# Patient Record
Sex: Male | Born: 1970
Health system: Southern US, Community
[De-identification: ages and names within clinical notes are randomized; demographics above are authoritative.]

## PROBLEM LIST (undated history)

## (undated) DIAGNOSIS — F411 Generalized anxiety disorder: Secondary | ICD-10-CM

## (undated) DIAGNOSIS — E7800 Pure hypercholesterolemia, unspecified: Secondary | ICD-10-CM

## (undated) DIAGNOSIS — M25559 Pain in unspecified hip: Secondary | ICD-10-CM

## (undated) DIAGNOSIS — E079 Disorder of thyroid, unspecified: Secondary | ICD-10-CM

## (undated) DIAGNOSIS — M1990 Unspecified osteoarthritis, unspecified site: Secondary | ICD-10-CM

## (undated) DIAGNOSIS — K529 Noninfective gastroenteritis and colitis, unspecified: Secondary | ICD-10-CM

## (undated) DIAGNOSIS — F17200 Nicotine dependence, unspecified, uncomplicated: Secondary | ICD-10-CM

## (undated) DIAGNOSIS — F1920 Other psychoactive substance dependence, uncomplicated: Secondary | ICD-10-CM

## (undated) DIAGNOSIS — Z8739 Personal history of other diseases of the musculoskeletal system and connective tissue: Secondary | ICD-10-CM

## (undated) DIAGNOSIS — F329 Major depressive disorder, single episode, unspecified: Secondary | ICD-10-CM

## (undated) DIAGNOSIS — M5430 Sciatica, unspecified side: Secondary | ICD-10-CM

## (undated) DIAGNOSIS — Z8639 Personal history of other endocrine, nutritional and metabolic disease: Secondary | ICD-10-CM

## (undated) DIAGNOSIS — M549 Dorsalgia, unspecified: Secondary | ICD-10-CM

## (undated) DIAGNOSIS — I10 Essential (primary) hypertension: Secondary | ICD-10-CM

## (undated) DIAGNOSIS — K219 Gastro-esophageal reflux disease without esophagitis: Secondary | ICD-10-CM

## (undated) DIAGNOSIS — G8929 Other chronic pain: Secondary | ICD-10-CM

## (undated) DIAGNOSIS — N289 Disorder of kidney and ureter, unspecified: Secondary | ICD-10-CM

## (undated) HISTORY — DX: Disorder of thyroid, unspecified: E07.9

## (undated) HISTORY — DX: Pain in unspecified hip: M25.559

## (undated) HISTORY — DX: Noninfective gastroenteritis and colitis, unspecified: K52.9

## (undated) HISTORY — PX: HEMORRHOID SURGERY: SHX153

## (undated) HISTORY — DX: Gastro-esophageal reflux disease without esophagitis: K21.9

## (undated) HISTORY — DX: Personal history of other diseases of the musculoskeletal system and connective tissue: Z87.39

## (undated) HISTORY — DX: Personal history of other endocrine, nutritional and metabolic disease: Z86.39

## (undated) HISTORY — DX: Generalized anxiety disorder: F41.1

## (undated) HISTORY — DX: Nicotine dependence, unspecified, uncomplicated: F17.200

## (undated) HISTORY — DX: Dorsalgia, unspecified: M54.9

## (undated) HISTORY — DX: Other psychoactive substance dependence, uncomplicated: F19.20

## (undated) HISTORY — DX: Major depressive disorder, single episode, unspecified: F32.9

## (undated) HISTORY — DX: Pure hypercholesterolemia, unspecified: E78.00

---

## 1998-06-21 ENCOUNTER — Ambulatory Visit (HOSPITAL_BASED_OUTPATIENT_CLINIC_OR_DEPARTMENT_OTHER): Admission: RE | Admit: 1998-06-21 | Discharge: 1998-06-21 | Payer: Self-pay | Admitting: Surgery

## 2003-05-19 ENCOUNTER — Ambulatory Visit (HOSPITAL_COMMUNITY): Admission: RE | Admit: 2003-05-19 | Discharge: 2003-05-19 | Payer: Self-pay | Admitting: Surgery

## 2003-05-19 ENCOUNTER — Encounter (INDEPENDENT_AMBULATORY_CARE_PROVIDER_SITE_OTHER): Payer: Self-pay | Admitting: *Deleted

## 2003-11-05 DIAGNOSIS — F3289 Other specified depressive episodes: Secondary | ICD-10-CM

## 2003-11-05 DIAGNOSIS — F329 Major depressive disorder, single episode, unspecified: Secondary | ICD-10-CM

## 2003-11-05 HISTORY — DX: Major depressive disorder, single episode, unspecified: F32.9

## 2003-11-05 HISTORY — DX: Other specified depressive episodes: F32.89

## 2004-12-24 ENCOUNTER — Ambulatory Visit: Payer: Self-pay | Admitting: Internal Medicine

## 2005-01-02 ENCOUNTER — Ambulatory Visit: Payer: Self-pay | Admitting: Internal Medicine

## 2005-01-19 ENCOUNTER — Emergency Department (HOSPITAL_COMMUNITY): Admission: EM | Admit: 2005-01-19 | Discharge: 2005-01-19 | Payer: Self-pay | Admitting: Emergency Medicine

## 2005-01-23 ENCOUNTER — Ambulatory Visit: Payer: Self-pay | Admitting: Internal Medicine

## 2005-01-30 ENCOUNTER — Ambulatory Visit: Payer: Self-pay | Admitting: Internal Medicine

## 2005-02-14 ENCOUNTER — Emergency Department (HOSPITAL_COMMUNITY): Admission: EM | Admit: 2005-02-14 | Discharge: 2005-02-14 | Payer: Self-pay | Admitting: Emergency Medicine

## 2005-02-18 ENCOUNTER — Ambulatory Visit: Payer: Self-pay | Admitting: Internal Medicine

## 2005-03-04 ENCOUNTER — Ambulatory Visit: Payer: Self-pay | Admitting: Internal Medicine

## 2005-03-04 ENCOUNTER — Emergency Department: Payer: Self-pay | Admitting: Emergency Medicine

## 2005-03-07 ENCOUNTER — Ambulatory Visit: Payer: Self-pay | Admitting: Internal Medicine

## 2005-03-19 ENCOUNTER — Ambulatory Visit: Payer: Self-pay | Admitting: Internal Medicine

## 2005-04-04 ENCOUNTER — Ambulatory Visit: Payer: Self-pay | Admitting: Pain Medicine

## 2005-04-05 ENCOUNTER — Ambulatory Visit: Payer: Self-pay | Admitting: Pain Medicine

## 2005-04-17 ENCOUNTER — Ambulatory Visit: Payer: Self-pay | Admitting: Pain Medicine

## 2005-04-18 ENCOUNTER — Ambulatory Visit: Payer: Self-pay | Admitting: Pain Medicine

## 2005-04-23 ENCOUNTER — Ambulatory Visit: Payer: Self-pay | Admitting: Pain Medicine

## 2005-05-01 ENCOUNTER — Ambulatory Visit: Payer: Self-pay | Admitting: Pain Medicine

## 2005-05-09 ENCOUNTER — Ambulatory Visit: Payer: Self-pay | Admitting: Pain Medicine

## 2005-05-21 ENCOUNTER — Ambulatory Visit: Payer: Self-pay | Admitting: Physician Assistant

## 2005-05-22 ENCOUNTER — Ambulatory Visit: Payer: Self-pay | Admitting: Internal Medicine

## 2005-06-07 ENCOUNTER — Ambulatory Visit: Payer: Self-pay | Admitting: Physician Assistant

## 2005-07-15 ENCOUNTER — Ambulatory Visit: Payer: Self-pay | Admitting: Internal Medicine

## 2005-07-30 ENCOUNTER — Ambulatory Visit: Payer: Self-pay | Admitting: Physician Assistant

## 2005-08-23 ENCOUNTER — Emergency Department: Payer: Self-pay | Admitting: Emergency Medicine

## 2005-08-27 ENCOUNTER — Ambulatory Visit: Payer: Self-pay | Admitting: Physician Assistant

## 2005-09-17 ENCOUNTER — Ambulatory Visit: Payer: Self-pay | Admitting: Internal Medicine

## 2005-09-23 ENCOUNTER — Ambulatory Visit: Payer: Self-pay | Admitting: Physician Assistant

## 2005-10-23 ENCOUNTER — Ambulatory Visit: Payer: Self-pay | Admitting: Physician Assistant

## 2005-11-13 ENCOUNTER — Ambulatory Visit: Payer: Self-pay | Admitting: Internal Medicine

## 2006-01-16 ENCOUNTER — Ambulatory Visit: Payer: Self-pay | Admitting: Internal Medicine

## 2006-01-23 ENCOUNTER — Ambulatory Visit: Payer: Self-pay | Admitting: Physician Assistant

## 2006-02-18 ENCOUNTER — Emergency Department: Payer: Self-pay | Admitting: Emergency Medicine

## 2006-02-19 ENCOUNTER — Ambulatory Visit: Payer: Self-pay | Admitting: Physician Assistant

## 2006-02-24 ENCOUNTER — Ambulatory Visit: Payer: Self-pay | Admitting: Pain Medicine

## 2006-03-05 ENCOUNTER — Ambulatory Visit: Payer: Self-pay | Admitting: Physician Assistant

## 2006-03-13 ENCOUNTER — Ambulatory Visit: Payer: Self-pay | Admitting: Pain Medicine

## 2006-03-17 ENCOUNTER — Ambulatory Visit: Payer: Self-pay | Admitting: Internal Medicine

## 2006-03-19 ENCOUNTER — Ambulatory Visit: Payer: Self-pay | Admitting: Pain Medicine

## 2006-03-26 ENCOUNTER — Ambulatory Visit: Payer: Self-pay | Admitting: Pain Medicine

## 2006-03-28 ENCOUNTER — Emergency Department (HOSPITAL_COMMUNITY): Admission: EM | Admit: 2006-03-28 | Discharge: 2006-03-29 | Payer: Self-pay | Admitting: Emergency Medicine

## 2006-04-01 ENCOUNTER — Ambulatory Visit: Payer: Self-pay | Admitting: Pain Medicine

## 2006-04-16 ENCOUNTER — Ambulatory Visit: Payer: Self-pay | Admitting: Pain Medicine

## 2006-04-30 ENCOUNTER — Ambulatory Visit: Payer: Self-pay | Admitting: Pain Medicine

## 2006-05-12 ENCOUNTER — Ambulatory Visit: Payer: Self-pay | Admitting: Pain Medicine

## 2006-05-16 ENCOUNTER — Ambulatory Visit: Payer: Self-pay | Admitting: Internal Medicine

## 2006-05-23 ENCOUNTER — Ambulatory Visit: Payer: Self-pay | Admitting: Neurology

## 2006-06-03 ENCOUNTER — Ambulatory Visit: Payer: Self-pay | Admitting: Pain Medicine

## 2006-06-11 ENCOUNTER — Ambulatory Visit: Payer: Self-pay | Admitting: Pain Medicine

## 2006-07-14 ENCOUNTER — Ambulatory Visit: Payer: Self-pay | Admitting: Pain Medicine

## 2006-07-14 ENCOUNTER — Ambulatory Visit: Payer: Self-pay | Admitting: Internal Medicine

## 2006-08-04 ENCOUNTER — Ambulatory Visit: Payer: Self-pay | Admitting: Internal Medicine

## 2006-08-18 ENCOUNTER — Ambulatory Visit: Payer: Self-pay | Admitting: Pain Medicine

## 2006-08-22 ENCOUNTER — Emergency Department: Payer: Self-pay | Admitting: Emergency Medicine

## 2006-08-25 ENCOUNTER — Ambulatory Visit: Payer: Self-pay | Admitting: Internal Medicine

## 2006-08-26 ENCOUNTER — Encounter: Payer: Self-pay | Admitting: Pain Medicine

## 2006-09-01 ENCOUNTER — Ambulatory Visit: Payer: Self-pay | Admitting: Pain Medicine

## 2006-09-04 ENCOUNTER — Ambulatory Visit: Payer: Self-pay | Admitting: Pain Medicine

## 2006-09-05 ENCOUNTER — Encounter: Payer: Self-pay | Admitting: Pain Medicine

## 2006-09-15 ENCOUNTER — Ambulatory Visit: Payer: Self-pay | Admitting: Physician Assistant

## 2006-09-18 ENCOUNTER — Ambulatory Visit: Payer: Self-pay | Admitting: Pain Medicine

## 2006-09-29 ENCOUNTER — Ambulatory Visit: Payer: Self-pay | Admitting: Internal Medicine

## 2006-10-01 ENCOUNTER — Ambulatory Visit: Payer: Self-pay | Admitting: Physician Assistant

## 2006-10-21 ENCOUNTER — Ambulatory Visit: Payer: Self-pay | Admitting: Internal Medicine

## 2006-11-12 ENCOUNTER — Ambulatory Visit: Payer: Self-pay | Admitting: Physician Assistant

## 2007-02-17 DIAGNOSIS — M549 Dorsalgia, unspecified: Secondary | ICD-10-CM | POA: Insufficient documentation

## 2007-02-17 HISTORY — DX: Dorsalgia, unspecified: M54.9

## 2007-08-14 ENCOUNTER — Ambulatory Visit: Payer: Self-pay | Admitting: Chiropractor

## 2007-11-27 ENCOUNTER — Ambulatory Visit: Payer: Self-pay | Admitting: Internal Medicine

## 2007-12-17 ENCOUNTER — Encounter: Payer: Self-pay | Admitting: Pain Medicine

## 2008-01-03 ENCOUNTER — Encounter: Payer: Self-pay | Admitting: Pain Medicine

## 2008-01-25 ENCOUNTER — Emergency Department: Payer: Self-pay | Admitting: Emergency Medicine

## 2008-03-11 ENCOUNTER — Ambulatory Visit: Payer: Self-pay | Admitting: Nurse Practitioner

## 2008-03-11 DIAGNOSIS — R109 Unspecified abdominal pain: Secondary | ICD-10-CM

## 2008-03-11 DIAGNOSIS — F411 Generalized anxiety disorder: Secondary | ICD-10-CM

## 2008-03-11 DIAGNOSIS — M25461 Effusion, right knee: Secondary | ICD-10-CM | POA: Insufficient documentation

## 2008-03-11 DIAGNOSIS — M25561 Pain in right knee: Secondary | ICD-10-CM

## 2008-03-11 DIAGNOSIS — E7800 Pure hypercholesterolemia, unspecified: Secondary | ICD-10-CM

## 2008-03-11 DIAGNOSIS — M25559 Pain in unspecified hip: Secondary | ICD-10-CM

## 2008-03-11 HISTORY — DX: Generalized anxiety disorder: F41.1

## 2008-03-11 HISTORY — DX: Pure hypercholesterolemia, unspecified: E78.00

## 2008-03-11 HISTORY — DX: Pain in unspecified hip: M25.559

## 2008-03-11 LAB — CONVERTED CEMR LAB
Specific Gravity, Urine: 1.015
WBC Urine, dipstick: NEGATIVE

## 2008-04-15 ENCOUNTER — Ambulatory Visit: Payer: Self-pay | Admitting: Nurse Practitioner

## 2008-04-15 LAB — CONVERTED CEMR LAB
AST: 18 units/L (ref 0–37)
Alkaline Phosphatase: 110 units/L (ref 39–117)
BUN: 18 mg/dL (ref 6–23)
Basophils Absolute: 0 10*3/uL (ref 0.0–0.1)
CO2: 26 meq/L (ref 19–32)
Eosinophils Relative: 1 % (ref 0–5)
Glucose, Bld: 87 mg/dL (ref 70–99)
Hemoglobin: 16.7 g/dL (ref 13.0–17.0)
Lymphocytes Relative: 18 % (ref 12–46)
Lymphs Abs: 2.2 10*3/uL (ref 0.7–4.0)
Monocytes Absolute: 0.8 10*3/uL (ref 0.1–1.0)
Neutro Abs: 9.3 10*3/uL — ABNORMAL HIGH (ref 1.7–7.7)
Neutrophils Relative %: 74 % (ref 43–77)
Potassium: 5.3 meq/L (ref 3.5–5.3)
RBC: 5.49 M/uL (ref 4.22–5.81)
TSH: 2.727 microintl units/mL (ref 0.350–5.50)
Total Bilirubin: 0.3 mg/dL (ref 0.3–1.2)
WBC: 12.4 10*3/uL — ABNORMAL HIGH (ref 4.0–10.5)

## 2008-04-25 ENCOUNTER — Telehealth (INDEPENDENT_AMBULATORY_CARE_PROVIDER_SITE_OTHER): Payer: Self-pay | Admitting: Nurse Practitioner

## 2008-05-09 ENCOUNTER — Encounter (INDEPENDENT_AMBULATORY_CARE_PROVIDER_SITE_OTHER): Payer: Self-pay | Admitting: Nurse Practitioner

## 2008-05-12 ENCOUNTER — Telehealth (INDEPENDENT_AMBULATORY_CARE_PROVIDER_SITE_OTHER): Payer: Self-pay | Admitting: Nurse Practitioner

## 2008-06-05 ENCOUNTER — Emergency Department (HOSPITAL_COMMUNITY): Admission: EM | Admit: 2008-06-05 | Discharge: 2008-06-06 | Payer: Self-pay | Admitting: Emergency Medicine

## 2008-06-13 ENCOUNTER — Ambulatory Visit: Payer: Self-pay | Admitting: Nurse Practitioner

## 2008-06-17 ENCOUNTER — Encounter (INDEPENDENT_AMBULATORY_CARE_PROVIDER_SITE_OTHER): Payer: Self-pay | Admitting: Nurse Practitioner

## 2008-06-20 ENCOUNTER — Ambulatory Visit: Payer: Self-pay | Admitting: Nurse Practitioner

## 2008-06-21 LAB — CONVERTED CEMR LAB
Albumin: 4.6 g/dL (ref 3.5–5.2)
HDL: 42 mg/dL (ref >39)
Total Bilirubin: 0.5 mg/dL (ref 0.3–1.2)
Total CHOL/HDL Ratio: 3.6
Total Protein: 7.5 g/dL (ref 6.0–8.3)
Triglycerides: 257 mg/dL — ABNORMAL HIGH (ref <150)

## 2008-06-30 ENCOUNTER — Encounter (INDEPENDENT_AMBULATORY_CARE_PROVIDER_SITE_OTHER): Payer: Self-pay | Admitting: Nurse Practitioner

## 2008-07-01 ENCOUNTER — Encounter (INDEPENDENT_AMBULATORY_CARE_PROVIDER_SITE_OTHER): Payer: Self-pay | Admitting: Nurse Practitioner

## 2008-07-15 ENCOUNTER — Ambulatory Visit: Payer: Self-pay | Admitting: Nurse Practitioner

## 2008-07-15 DIAGNOSIS — F17200 Nicotine dependence, unspecified, uncomplicated: Secondary | ICD-10-CM

## 2008-07-15 HISTORY — DX: Nicotine dependence, unspecified, uncomplicated: F17.200

## 2008-07-25 ENCOUNTER — Telehealth (INDEPENDENT_AMBULATORY_CARE_PROVIDER_SITE_OTHER): Payer: Self-pay | Admitting: Nurse Practitioner

## 2008-07-27 ENCOUNTER — Encounter (INDEPENDENT_AMBULATORY_CARE_PROVIDER_SITE_OTHER): Payer: Self-pay | Admitting: Nurse Practitioner

## 2008-08-08 ENCOUNTER — Telehealth (INDEPENDENT_AMBULATORY_CARE_PROVIDER_SITE_OTHER): Payer: Self-pay | Admitting: Nurse Practitioner

## 2008-09-09 ENCOUNTER — Telehealth (INDEPENDENT_AMBULATORY_CARE_PROVIDER_SITE_OTHER): Payer: Self-pay | Admitting: Nurse Practitioner

## 2008-10-13 ENCOUNTER — Telehealth (INDEPENDENT_AMBULATORY_CARE_PROVIDER_SITE_OTHER): Payer: Self-pay | Admitting: Nurse Practitioner

## 2008-10-14 ENCOUNTER — Ambulatory Visit: Payer: Self-pay | Admitting: Nurse Practitioner

## 2008-10-14 LAB — CONVERTED CEMR LAB: HDL goal, serum: 40 mg/dL

## 2008-10-17 LAB — CONVERTED CEMR LAB
ALT: 18 units/L (ref 0–53)
AST: 14 units/L (ref 0–37)
Albumin: 4.6 g/dL (ref 3.5–5.2)
Alkaline Phosphatase: 84 units/L (ref 39–117)
Cholesterol: 196 mg/dL (ref 0–200)
HDL: 35 mg/dL — ABNORMAL LOW (ref >39)
Total Bilirubin: 0.5 mg/dL (ref 0.3–1.2)
Total CHOL/HDL Ratio: 5.6
Total Protein: 7.2 g/dL (ref 6.0–8.3)

## 2008-12-29 ENCOUNTER — Ambulatory Visit: Payer: Self-pay | Admitting: Nurse Practitioner

## 2008-12-29 DIAGNOSIS — K219 Gastro-esophageal reflux disease without esophagitis: Secondary | ICD-10-CM

## 2008-12-29 HISTORY — DX: Gastro-esophageal reflux disease without esophagitis: K21.9

## 2009-01-31 ENCOUNTER — Ambulatory Visit: Payer: Self-pay | Admitting: Nurse Practitioner

## 2009-01-31 DIAGNOSIS — R059 Cough, unspecified: Secondary | ICD-10-CM | POA: Insufficient documentation

## 2009-01-31 DIAGNOSIS — R05 Cough: Secondary | ICD-10-CM | POA: Insufficient documentation

## 2009-01-31 DIAGNOSIS — J029 Acute pharyngitis, unspecified: Secondary | ICD-10-CM | POA: Insufficient documentation

## 2009-01-31 LAB — CONVERTED CEMR LAB: Rapid Strep: NEGATIVE

## 2009-03-08 ENCOUNTER — Emergency Department (HOSPITAL_COMMUNITY): Admission: EM | Admit: 2009-03-08 | Discharge: 2009-03-08 | Payer: Self-pay | Admitting: Emergency Medicine

## 2009-03-20 ENCOUNTER — Telehealth (INDEPENDENT_AMBULATORY_CARE_PROVIDER_SITE_OTHER): Payer: Self-pay | Admitting: Nurse Practitioner

## 2009-04-07 ENCOUNTER — Encounter (INDEPENDENT_AMBULATORY_CARE_PROVIDER_SITE_OTHER): Payer: Self-pay | Admitting: Nurse Practitioner
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
## 2009-08-23 ENCOUNTER — Encounter (INDEPENDENT_AMBULATORY_CARE_PROVIDER_SITE_OTHER): Payer: Self-pay | Admitting: Nurse Practitioner

## 2009-09-20 ENCOUNTER — Telehealth: Payer: Self-pay | Admitting: Physician Assistant

## 2009-11-21 ENCOUNTER — Telehealth (INDEPENDENT_AMBULATORY_CARE_PROVIDER_SITE_OTHER): Payer: Self-pay | Admitting: Nurse Practitioner

## 2009-12-20 ENCOUNTER — Telehealth (INDEPENDENT_AMBULATORY_CARE_PROVIDER_SITE_OTHER): Payer: Self-pay | Admitting: Nurse Practitioner

## 2010-01-09 ENCOUNTER — Encounter (INDEPENDENT_AMBULATORY_CARE_PROVIDER_SITE_OTHER): Payer: Self-pay | Admitting: Nurse Practitioner

## 2010-01-16 ENCOUNTER — Encounter (INDEPENDENT_AMBULATORY_CARE_PROVIDER_SITE_OTHER): Payer: Self-pay | Admitting: Nurse Practitioner

## 2010-03-06 ENCOUNTER — Ambulatory Visit: Payer: Self-pay | Admitting: Nurse Practitioner

## 2010-03-06 LAB — CONVERTED CEMR LAB
Glucose, Urine, Semiquant: NEGATIVE
Ketones, urine, test strip: NEGATIVE
Rapid HIV Screen: NEGATIVE
Specific Gravity, Urine: >=1.03
Urobilinogen, UA: 0.2

## 2010-03-09 ENCOUNTER — Encounter (INDEPENDENT_AMBULATORY_CARE_PROVIDER_SITE_OTHER): Payer: Self-pay | Admitting: Nurse Practitioner

## 2010-03-12 ENCOUNTER — Encounter (INDEPENDENT_AMBULATORY_CARE_PROVIDER_SITE_OTHER): Payer: Self-pay | Admitting: Nurse Practitioner

## 2010-03-12 LAB — CONVERTED CEMR LAB
ALT: 20 units/L (ref 0–53)
AST: 13 units/L (ref 0–37)
Alkaline Phosphatase: 137 units/L — ABNORMAL HIGH (ref 39–117)
Basophils Relative: 0 % (ref 0–1)
Calcium: 9.7 mg/dL (ref 8.4–10.5)
Chlamydia, Swab/Urine, PCR: NEGATIVE
Chloride: 99 meq/L (ref 96–112)
Cholesterol: 200 mg/dL (ref 0–200)
Eosinophils Absolute: 0.1 10*3/uL (ref 0.0–0.7)
Eosinophils Relative: 1 % (ref 0–5)
Glucose, Bld: 99 mg/dL (ref 70–99)
HCT: 51 % (ref 39.0–52.0)
LDL Cholesterol: 119 mg/dL — ABNORMAL HIGH (ref 0–99)
Lymphocytes Relative: 19 % (ref 12–46)
MCV: 88.2 fL (ref 78.0–100.0)
Monocytes Absolute: 0.7 10*3/uL (ref 0.1–1.0)
Neutro Abs: 7.2 10*3/uL (ref 1.7–7.7)
Neutrophils Relative %: 74 % (ref 43–77)
RBC: 5.78 M/uL (ref 4.22–5.81)
Sodium: 139 meq/L (ref 135–145)
VLDL: 38 mg/dL (ref 0–40)
WBC: 9.8 10*3/uL (ref 4.0–10.5)

## 2010-04-06 ENCOUNTER — Telehealth (INDEPENDENT_AMBULATORY_CARE_PROVIDER_SITE_OTHER): Payer: Self-pay | Admitting: Nurse Practitioner

## 2010-04-06 LAB — CONVERTED CEMR LAB: GGT: 36 units/L (ref 7–51)

## 2010-06-15 ENCOUNTER — Encounter (INDEPENDENT_AMBULATORY_CARE_PROVIDER_SITE_OTHER): Payer: Self-pay | Admitting: Nurse Practitioner

## 2010-06-24 ENCOUNTER — Emergency Department (HOSPITAL_COMMUNITY): Admission: EM | Admit: 2010-06-24 | Discharge: 2010-06-25 | Payer: Self-pay | Admitting: Emergency Medicine

## 2010-07-23 ENCOUNTER — Telehealth (INDEPENDENT_AMBULATORY_CARE_PROVIDER_SITE_OTHER): Payer: Self-pay | Admitting: Nurse Practitioner

## 2010-10-05 ENCOUNTER — Encounter (INDEPENDENT_AMBULATORY_CARE_PROVIDER_SITE_OTHER): Payer: Self-pay | Admitting: Nurse Practitioner

## 2010-10-10 ENCOUNTER — Telehealth (INDEPENDENT_AMBULATORY_CARE_PROVIDER_SITE_OTHER): Payer: Self-pay | Admitting: Nurse Practitioner

## 2010-10-12 ENCOUNTER — Telehealth (INDEPENDENT_AMBULATORY_CARE_PROVIDER_SITE_OTHER): Payer: Self-pay | Admitting: Nurse Practitioner

## 2010-10-25 ENCOUNTER — Ambulatory Visit: Payer: Self-pay | Admitting: Nurse Practitioner

## 2010-11-04 ENCOUNTER — Emergency Department: Payer: Self-pay | Admitting: Internal Medicine

## 2010-11-06 ENCOUNTER — Telehealth (INDEPENDENT_AMBULATORY_CARE_PROVIDER_SITE_OTHER): Payer: Self-pay | Admitting: Internal Medicine

## 2010-12-04 NOTE — Letter (Signed)
Summary: MEDCO  MEDCO   Imported By: Arta Bruce 04/16/2010 15:19:20  _____________________________________________________________________  External Attachment:    Type:   Image     Comment:   External Document

## 2010-12-04 NOTE — Progress Notes (Signed)
Summary: TIME TO Jeremy Delgado RX  Phone Note Call from Patient Call back at [facility] Phone [phone]   Reason for Call: Refill Medication Summary of Call: MARTIN PT. MR Cazarez IS CALLING IN FOR HIS REFILL ON Jeremy Delgado RX. HE SAYS ITS DUE ON 02/17 Initial call taken by: Leodis Rains,  December 20, 2009 9:58 AM  Follow-up for Phone Call        forward to provider  last rx 1/17...... Follow-up by: [name] CMA,  December 20, 2009 10:08 AM  Additional Follow-up for Phone Call Additional follow up Details #1::        Rx printed and in basket Last visit 01/2009. Next month will be 1 year He will need to schedule an office visit in March 2011.  Advise pt to be fasting for routine labs Additional Follow-up by: Lehman Prom FNP,  December 21, 2009 8:11 AM    Additional Follow-up for Phone Call Additional follow up Details #2::    pt is scheuled to come in on 12/28/09 for office visit. Pt informed to be fasting for this appt. Follow-up by: [name],  December 22, 2009 12:28 PM  Prescriptions: ALPRAZOLAM 2 MG XR24H-TAB (ALPRAZOLAM) 1 tablet by mouth daily as needed for nerves  #30 x 0   Entered and Authorized by:   Lehman Prom FNP   Signed by:   Lehman Prom FNP on 12/21/2009   Method used:   Printed then faxed to ...       [facility]* (retail)       [address]       [place], [place]  [address]       Ph: [phone]       Fax: [phone]   RxID:   [id number]

## 2010-12-04 NOTE — Progress Notes (Signed)
Summary: Query:  Refill Omeprazole?  Phone Note Outgoing Call   Summary of Call: Do you want his omeprazole refilled? Last seen 03/06/10.   Initial call taken by: Dutch Quint RN,  July 23, 2010 11:35 AM  Follow-up for Phone Call        yes, ok to refill Follow-up by: Lehman Prom FNP,  July 23, 2010 3:21 PM  Additional Follow-up for Phone Call Additional follow up Details #1::        Refill completed.   Left message on answering machine for pt. to return call.  Dutch Quint RN  July 23, 2010 3:35 PM  Left message on answering machine for pt. to return call.  Dutch Quint RN  July 24, 2010 12:28 PM  Pt. notified of refill.  Dutch Quint RN  July 24, 2010 3:36 PM     Additional Follow-up for Phone Call Additional follow up Details #2::      Prescriptions: OMEPRAZOLE 20 MG TBEC (OMEPRAZOLE) One tablet by mouth two times a day 30 minutes before the meals for stomach  #60 x 4   Entered by:   Dutch Quint RN   Authorized by:   Lehman Prom FNP   Signed by:   Dutch Quint RN on 07/23/2010   Method used:   Electronically to        [facility]* (retail)       [address]       [place], [place]  [address]       Ph: [phone]       Fax: [phone]   RxID:   [id number]
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
## 2010-12-04 NOTE — Letter (Signed)
Summary: MEDCO  MEDCO   Imported By: Arta Bruce 06/28/2010 12:56:03  _____________________________________________________________________  External Attachment:    Type:   Image     Comment:   External Document

## 2010-12-04 NOTE — Assessment & Plan Note (Signed)
Summary: Depression/Hyperlipidemia   Vital Signs:  Patient profile:   40 year old male Weight:      185.4 pounds BMI:     25.95 BSA:     2.04 Temp:     98.5 degrees F oral Pulse rate:   120 / minute Pulse rhythm:   regular Resp:     20 per minute BP sitting:   132 / 96  (left arm) Cuff size:   regular  Vitals Entered By: [name] (Mar 06, 2010 11:38 AM) CC: renew medication...not sleeping , Lipid Management, Depression, Abdominal Pain Is Patient Diabetic? No Pain Assessment Patient in pain? yes     Location: leg, left side Intensity: 7 Onset of pain  Chronic  Does patient need assistance? Functional Status Self care Ambulation Normal   CC:  renew medication...not sleeping , Lipid Management, Depression, and Abdominal Pain.  History of Present Illness:  Pt into the office for follow-up - no visit for 1 year pt is fasting today for labs  Pain meds - still being managed by neurologist; pain meds ordered by that office   Anxiety - Alprazolam 2mg  XR daily until last month at which time he needed an appt and was not honered with refills.  He has not taken the medications in over 1 week. Following the cessation of the medication he started with numbess in right hand and numbness in his tongue.  Currently receiving disability and is unemployed.  Pt is divorced and is raising his Clune son.  Depression History:      The patient denies a depressed mood most of the day and a diminished interest in his usual daily activities.  The patient denies recurrent thoughts of death or suicide.        The patient denies that he feels like life is not worth living, denies that he wishes that he were dead, and denies that he has thought about ending his life.         Depression Treatment History:  Prior Medication Used:   Start Date: Assessment of Effect:   Comments:  Prozac (fluoxetine)     --     much improvement     --  Dyspepsia History:      He has no alarm features of  dyspepsia including no history of melena, hematochezia, dysphagia, persistent vomiting, or involuntary weight loss > 5%.  There is a prior history of GERD.  The patient does not have a prior history of documented ulcer disease.  The dominant symptom is heartburn or acid reflux.  An H-2 blocker medication is not currently being taken.  He has no history of a positive H. Pylori serology.  No previous upper endoscopy has been done.    Lipid Management History:      Positive NCEP/ATP III risk factors include HDL cholesterol less than 40 and current tobacco user.  Negative NCEP/ATP III risk factors include male age less than 40 years old, non-diabetic, no ASHD (atherosclerotic heart disease), no prior stroke/TIA, no peripheral vascular disease, and no history of aortic aneurysm.        The patient states that he does not know about the "Therapeutic Lifestyle Change" diet.  The patient does not know about adjunctive measures for cholesterol lowering.  Comments include: pt is taking meds as ordered.  The patient denies any symptoms to suggest myopathy or liver disease.      Habits & Providers  Alcohol-Tobacco-Diet     Alcohol drinks/day: 0  Tobacco Status: current     Tobacco Counseling: to quit use of tobacco products     Year Started: age 56  Exercise-Depression-Behavior     Drug Use: no     Seat Belt Use: 100     Sun Exposure: occasionally  Medications Prior to Update: 1)  Alprazolam 2 Mg Xr24h-Tab (Alprazolam) .Marland Kitchen.. 1 Tablet By Mouth Daily As Needed For Nerves 2)  Fluoxetine Hcl 40 Mg Caps (Fluoxetine Hcl) .Marland Kitchen.. 1 Tablet By Mouth Daily 3)  Lipitor 40 Mg Tabs (Atorvastatin Calcium) .Marland Kitchen.. 1 Tablet By Mouth Nightly For Cholesterol **pharmacy - Note Dose Change** 4)  Loratadine 10 Mg  Tabs (Loratadine) .Marland Kitchen.. 1 Tablet By Mouth Daily For Allergies 5)  Flexeril 10 Mg  Tabs (Cyclobenzaprine Hcl) .... One Tablet By Mouth Four Times Daily **rx By Neuro** 6)  Oxycodone Hcl 15 Mg Tabs (Oxycodone Hcl) ....  Take 4 Times Daily As Needed  **rx By Neuro** 7)  Meloxicam 7.5 Mg Tabs (Meloxicam) .Marland Kitchen.. 1 Tablet By Mouth Two Times A Day As Needed For Pain **rx By Neuro** 8)  Chantix Continuing Month Pak 1 Mg Tabs (Varenicline Tartrate) .Marland Kitchen.. 1 Tablet By Mouth Two Times A Day 9)  Kadian 30 Mg Xr24h-Cap (Morphine Sulfate) .Marland Kitchen.. 1 in The Morning and 2 in The Evening  **rx By Feraru** 10)  Omeprazole 20 Mg Tbec (Omeprazole) .... One Tablet By Mouth Two Times A Day 30 Minutes Before The Meals For Stomach 11)  Ceron-Dm 12.03-07-14 Mg/57ml Syrp (Phenylephrine-Chlorphen-Dm) .Marland Kitchen.. 1 Teaspoon Every 6 Hours As Needed For Cough 12)  Ventolin Hfa 108 (90 Base) Mcg/act Aers (Albuterol Sulfate) .... 2 Puffs Every 6 Hours As Needed For Shortness of Breath  Allergies (verified): 1)  ! * Codiene 2)  ! * Lyrica 3)  ! Prednisone 4)  ! * Bee Stings  Review of Systems CV:  Denies chest pain or discomfort. Resp:  Denies cough. GI:  Denies abdominal pain, nausea, and vomiting. MS:  Complains of joint pain and low back pain.  Physical Exam  General:  alert.   Head:  normocephalic.   Lungs:  normal breath sounds.   Heart:  normal rate and regular rhythm.   Msk:  up to the exam table - slow Neurologic:  alert & oriented X3.   Psych:  stoic   Impression & Recommendations:  Problem # 1:  DEPRESSION (ICD-311) advised pt to f/u if symptoms worsen - will continue fluoxentine for now will restart alprazolam - advised pt that side effects from tinging and numbness may be due to withdrawl from meds His updated medication list for this problem includes:    Alprazolam 2 Mg Xr24h-tab (Alprazolam) .Marland Kitchen... 1 tablet by mouth daily as needed for nerves    Fluoxetine Hcl 40 Mg Caps (Fluoxetine hcl) .Marland Kitchen... 1 tablet by mouth daily  Orders: Rapid HIV  ([address]) T-TSH ([address]454-[address]11)  Problem # 2:  HYPERCHOLESTEROLEMIA (ICD-272.0) will check labs today His updated medication list for this problem includes:    Lipitor 40 Mg Tabs  (Atorvastatin calcium) .Marland Kitchen... 1 tablet by mouth nightly for cholesterol **pharmacy - note dose change**  Orders: T-Lipid Profile (91478-29562) T-Comprehensive Metabolic Panel (13086-57846) T-CBC w/Diff (96295-28413)  Problem # 3:  BACK PAIN (ICD-724.5) pt to maintain f/u with neuro The following medications were removed from the medication list:    Meloxicam 7.5 Mg Tabs (Meloxicam) .Marland Kitchen... 1 tablet by mouth two times a day as needed for pain **rx by neuro** His updated medication list for this problem includes:  Flexeril 10 Mg Tabs (Cyclobenzaprine hcl) ..... One tablet by mouth four times daily **rx by neuro**    Oxycodone Hcl 15 Mg Tabs (Oxycodone hcl) .Marland Kitchen... Take 4 times daily as needed  **rx by neuro**    Kadian 30 Mg Xr24h-cap (Morphine sulfate) .Marland Kitchen... 1 in the morning and 2 in the evening  **rx by feraru**  Orders: UA Dipstick w/o Micro (manual) ([address])  Problem # 4:  ANXIETY (ICD-300.00)  His updated medication list for this problem includes:    Alprazolam 2 Mg Xr24h-tab (Alprazolam) .Marland Kitchen... 1 tablet by mouth daily as needed for nerves    Fluoxetine Hcl 40 Mg Caps (Fluoxetine hcl) .Marland Kitchen... 1 tablet by mouth daily  Problem # 5:  HEALTH MAINTENANCE EXAM (ICD-V70.0) tdap given today Orders: T- GC Chlamydia ([address])  Complete Medication List: 1)  Alprazolam 2 Mg Xr24h-tab (Alprazolam) .Marland Kitchen.. 1 tablet by mouth daily as needed for nerves 2)  Fluoxetine Hcl 40 Mg Caps (Fluoxetine hcl) .Marland Kitchen.. 1 tablet by mouth daily 3)  Lipitor 40 Mg Tabs (Atorvastatin calcium) .Marland Kitchen.. 1 tablet by mouth nightly for cholesterol **pharmacy - note dose change** 4)  Loratadine 10 Mg Tabs (Loratadine) .Marland Kitchen.. 1 tablet by mouth daily for allergies 5)  Flexeril 10 Mg Tabs (Cyclobenzaprine hcl) .... One tablet by mouth four times daily **rx by neuro** 6)  Oxycodone Hcl 15 Mg Tabs (Oxycodone hcl) .... Take 4 times daily as needed  **rx by neuro** 7)  Kadian 30 Mg Xr24h-cap (Morphine sulfate) .Marland Kitchen.. 1 in the morning and 2 in  the evening  **rx by feraru** 8)  Omeprazole 20 Mg Tbec (Omeprazole) .... One tablet by mouth two times a day 30 minutes before the meals for stomach 9)  Ventolin Hfa 108 (90 Base) Mcg/act Aers (Albuterol sulfate) .... 2 puffs every 6 hours as needed for shortness of breath  Other Orders: Tdap => 31yrs IM ([address]11) Admin 1st Vaccine (91478) Admin 1st Vaccine (State) [phone])  Lipid Assessment/Plan:      Based on NCEP/ATP III, the patient's risk factor category is "0-1 risk factors".  The patient's lipid goals are as follows: Total cholesterol goal is 200; LDL cholesterol goal is 160; HDL cholesterol goal is 40; Triglyceride goal is 150.     Patient Instructions: 1)  You will be notified of any abnormal labs 2)  Tingling, numbness and shakes most likely was from withdrawl of alprazolam 3)  Follow up at least yearly or as needed  Prescriptions: ALPRAZOLAM 2 MG XR24H-TAB (ALPRAZOLAM) 1 tablet by mouth daily as needed for nerves  #30 x 0   Entered and Authorized by:   Lehman Prom FNP   Signed by:   Lehman Prom FNP on 03/06/2010   Method used:   Print then Give to Patient   RxID:   [id number]    Tetanus/Td Vaccine    Vaccine Type: Tdap    Site: left deltoid    Mfr: Sanofi Pasteur    Dose: 0.5 ml    Route: IM    Given by: [name]    Exp. Date: 06/01/2012    Lot #: [id number]    VIS given: 09/22/07 version given Mar 06, 2010.   Last LDL:                                                 114 (10/14/2008 10:02:00 PM)  Laboratory Results   Urine Tests  Date/Time Received: Mar 06, 2010 1:45 PM   Routine Urinalysis   Color: Jeren Dufrane Appearance: Clear Glucose: negative   (Normal Range: Negative) Bilirubin: small   (Normal Range: Negative) Ketone: negative   (Normal Range: Negative) Spec. Gravity: >=1.030   (Normal Range: 1.003-1.035) Blood: negative   (Normal Range: Negative) pH: 5.0   (Normal Range: 5.0-8.0) Protein: 30   (Normal Range:  Negative) Urobilinogen: 0.2   (Normal Range: 0-1) Nitrite: negative   (Normal Range: Negative) Leukocyte Esterace: negative   (Normal Range: Negative)    Date/Time Received: Mar 06, 2010 1:46 PM   Other Tests  Rapid HIV: negative

## 2010-12-04 NOTE — Progress Notes (Signed)
Summary: Refills  Phone Note Call from Patient Call back at [facility] Phone [phone]   Summary of Call: The pt needs more refills from alprozalam and lipitor medications [facility]) . Please let him know when is ready. [facility] FNP Initial call taken by: Manon Hilding,  April 06, 2010 8:18 AM  Follow-up for Phone Call        Refills already printed and given to [facility] to fax back notify pt to check with pharmacy later today Follow-up by: Lehman Prom FNP,  April 06, 2010 8:22 AM  Additional Follow-up for Phone Call Additional follow up Details #1::        pt informed of above information Additional Follow-up by: [name],  April 06, 2010 10:07 AM

## 2010-12-04 NOTE — Letter (Signed)
Summary: [facility]  [facility]CO   Imported By: Arta Bruce 04/05/2010 14:34:27  _____________________________________________________________________  External Attachment:    Type:   Image     Comment:   External Document

## 2010-12-04 NOTE — Progress Notes (Signed)
Summary: Jeremy Delgado  Phone Note Call from Patient Call back at [facility] Phone [phone]   Summary of Call: aCCORDING TO THE PT [facility] SENT A REQUEST ABOUT THE XANAX MEDICATION AND THE PT IS WONDERING HOW SOON  HE CAN GET HIS REFILLS REQUEST BACK TO THE PHARMACY. MARTING FNP Initial call taken by: Manon Hilding,  November 21, 2009 2:15 PM  Follow-up for Phone Call        forward to N. Daphine Deutscher, FNP [name]  November 21, 2009 5:07 PM   Additional Follow-up for Phone Call Additional follow up Details #1::        See med list Rx done on 1/17/201, it should have been faxed to the pharmacy call pharmacy and if they did not recieve the fax (which apparently they didn't) call in med as per directions on list Additional Follow-up by: Lehman Prom FNP,  November 22, 2009 7:50 AM    Additional Follow-up for Phone Call Additional follow up Details #2::    left message to return call........Marland KitchenMikey [employer] [facility]  November 24, 2009 12:30 PM   [name]  November 27, 2009 2:33 PM Spoke with pharmacy they received Rx and pt has picked up Rx. Follow-up by: [name],  November 27, 2009 2:33 PM

## 2010-12-05 ENCOUNTER — Telehealth (INDEPENDENT_AMBULATORY_CARE_PROVIDER_SITE_OTHER): Payer: Self-pay | Admitting: Nurse Practitioner

## 2010-12-06 NOTE — Medication Information (Signed)
Summary: RX Folder//MEDCO  RX Folder//MEDCO   Imported By: Arta Bruce 10/15/2010 09:59:01  _____________________________________________________________________  External Attachment:    Type:   Image     Comment:   External Document

## 2010-12-06 NOTE — Progress Notes (Signed)
Summary: Alprazolam visit  Phone Note From Pharmacy   Summary of Call: Request for refill of Alprazolam--not due until the 6th Initial call taken by: Julieanne Manson MD,  November 06, 2010 6:47 PM  Follow-up for Phone Call        Rx in basket - shiela in basket notify pt to check with the pharmacy Follow-up by: Lehman Prom FNP,  November 08, 2010 2:22 PM  Additional Follow-up for Phone Call Additional follow up Details #1::        Pt. notified.  Dutch Quint RN  November 08, 2010 2:32 PM     Prescriptions: ALPRAZOLAM 2 MG XR24H-TAB (ALPRAZOLAM) 1 tablet by mouth daily as needed for nerves  #30 x 0   Entered and Authorized by:   Lehman Prom FNP   Signed by:   Lehman Prom FNP on 11/08/2010   Method used:   Printed then faxed to ...       [facility]* (retail)       [address]       [place], [place]  [address]       Ph: [phone]       Fax: [phone]   RxID:   [id number]

## 2010-12-06 NOTE — Progress Notes (Signed)
Summary: Referral to Jeremy Delgado  Phone Note Outgoing Call   Summary of Call: Refer to pt Jeremy Delgado - Depression  notify pt of time/date of appt  Initial call taken by: Lehman Prom FNP,  October 12, 2010 5:45 PM  Follow-up for Phone Call        I call the pt and he has an appt 10-25-10 @ 2pm pt aware of his appt Follow-up by: Cheryll Dessert,  October 19, 2010 9:28 AM

## 2010-12-12 NOTE — Progress Notes (Signed)
Summary: Med refill  Phone Note Outgoing Call   Summary of Call: pt was advised on last month that he needed an office visit for alprazolam.   Does he have an appt scheduled? If not, call pt and inform him that it has been 1 year since his last visit here.  Med will not be refilled until he comes for a visit Initial call taken by: Lehman Prom FNP,  December 05, 2010 6:10 PM  Follow-up for Phone Call        Left message on answering machine for pt to call back...Marland KitchenMarland KitchenArmenia Shannon  December 06, 2010 11:35 AM  pt says he is running low and he says he only has enough to last til sunday and he wants to know if he could have some til his appt date... pt has appt on feb 8 @ 11:45.... [facility] Follow-up by: China Shannon,  December 07, 2010 9:43 AM  Additional Follow-up for Phone Call Additional follow up Details #1::        OK. will refill since pt has an appt on next week. advise him to check with [facility] for the refill and to keep his appt Additional Follow-up by: Kally Cadden Martin FNP,  December 07, 2010 12:15 PM    Additional Follow-up for Phone Call Additional follow up Details #2::    pt is aware Follow-up by: China Shannon,  December 07, 2010 12:47 PM  Prescriptions: ALPRAZOLAM 2 MG XR24H-TAB (ALPRAZOLAM) 1 tablet by mouth daily as needed for nerves  #30 x 0   Entered and Authorized by:   Teaghan Melrose Martin FNP   Signed by:   Alajia Schmelzer Martin FNP on 12/07/2010   Method used:   Printed then faxed to ...       [facility]* (retail)       [address]       [place], [place]  [address]       Ph: [phone]       Fax: [phone]   RxID:   [id number]

## 2010-12-19 ENCOUNTER — Encounter (INDEPENDENT_AMBULATORY_CARE_PROVIDER_SITE_OTHER): Payer: Self-pay | Admitting: Nurse Practitioner

## 2010-12-19 ENCOUNTER — Encounter: Payer: Self-pay | Admitting: Nurse Practitioner

## 2010-12-19 LAB — CONVERTED CEMR LAB: Rapid HIV Screen: NEGATIVE

## 2010-12-20 LAB — CONVERTED CEMR LAB
ALT: 18 units/L (ref 0–53)
Albumin: 4.4 g/dL (ref 3.5–5.2)
Alkaline Phosphatase: 105 units/L (ref 39–117)
Basophils Absolute: 0 10*3/uL (ref 0.0–0.1)
Calcium: 9.6 mg/dL (ref 8.4–10.5)
Cholesterol: 204 mg/dL — ABNORMAL HIGH (ref 0–200)
Eosinophils Absolute: 0.1 10*3/uL (ref 0.0–0.7)
Eosinophils Relative: 2 % (ref 0–5)
Lymphs Abs: 1.6 10*3/uL (ref 0.7–4.0)
MCHC: 33.4 g/dL (ref 30.0–36.0)
Monocytes Absolute: 0.8 10*3/uL (ref 0.1–1.0)
Neutro Abs: 5.1 10*3/uL (ref 1.7–7.7)
Platelets: 344 10*3/uL (ref 150–400)
Potassium: 5.4 meq/L — ABNORMAL HIGH (ref 3.5–5.3)
RBC: 4.97 M/uL (ref 4.22–5.81)
RDW: 13.8 % (ref 11.5–15.5)
Sodium: 141 meq/L (ref 135–145)
WBC: 7.7 10*3/uL (ref 4.0–10.5)

## 2010-12-26 NOTE — Assessment & Plan Note (Signed)
Summary: Hypercholesterolemia   Vital Signs:  Patient profile:   40 year old male Weight:      201.0 pounds BMI:     28.14 Temp:     97.8 degrees F oral Pulse rate:   80 / minute Pulse rhythm:   regular Resp:     20 per minute BP sitting:   114 / 84  (left arm) Cuff size:   regular  Vitals Entered By: [name] (December 19, 2010 11:32 AM)  Nutrition Counseling: Patient's BMI is greater than 25 and therefore counseled on weight management options. CC: follow-up visit...renew medications, Lipid Management, Abdominal Pain, Depression Is Patient Diabetic? No Pain Assessment Patient in pain? yes     Location: leg, foot Intensity: 6 Onset of pain  Chronic  Does patient need assistance? Functional Status Self care Ambulation Normal   CC:  follow-up visit...renew medications, Lipid Management, Abdominal Pain, and Depression.  History of Present Illness:  Pt into the office for follow up Last visit 1 year ago  Pt presents today with all his medications  Chronic pain - ongoing f/u with pain management.  He presents today with medications from that office of which he maintains chronic f/u.    Depression History:      The patient denies recurrent thoughts of death or suicide.        Psychosocial stress factors include major life changes.  The patient denies that he feels like life is not worth living, denies that he wishes that he were dead, and denies that he has thought about ending his life.         Depression Treatment History:  Prior Medication Used:   Start Date: Assessment of Effect:   Comments:  Prozac (fluoxetine)     --       --       ongoing symptoms  Dyspepsia History:      He has no alarm features of dyspepsia including no history of melena, hematochezia, dysphagia, persistent vomiting, or involuntary weight loss > 5%.  There is a prior history of GERD.  The patient does not have a prior history of documented ulcer disease.  The dominant symptom is  heartburn or acid reflux.  An H-2 blocker medication is not currently being taken.    Lipid Management History:      Positive NCEP/ATP III risk factors include current tobacco user.  Negative NCEP/ATP III risk factors include male age less than 45 years old, non-diabetic, no ASHD (atherosclerotic heart disease), no prior stroke/TIA, no peripheral vascular disease, and no history of aortic aneurysm.        The patient states that he does not know about the "Therapeutic Lifestyle Change" diet.  He expresses no side effects from his lipid-lowering medication.  The patient denies any symptoms to suggest myopathy or liver disease.      Habits & Providers  Alcohol-Tobacco-Diet     Alcohol drinks/day: 0     Tobacco Status: current     Tobacco Counseling: to quit use of tobacco products     Year Started: age 66  Exercise-Depression-Behavior     Does Patient Exercise: yes     Exercise Counseling: to improve exercise regimen     Have you felt down or hopeless? yes     Have you felt little pleasure in things? yes     Depression Counseling: not indicated; screening negative for depression     Drug Use: no     Seat Belt Use: 100  Sun Exposure: occasionally  Comments: Pt has been going to the [employer] and doing water aerobic  Medications Prior to Update: 1)  Alprazolam 2 Mg Xr24h-Tab (Alprazolam) .Marland Kitchen.. 1 Tablet By Mouth Daily As Needed For Nerves 2)  Fluoxetine Hcl 40 Mg Caps (Fluoxetine Hcl) .Marland Kitchen.. 1 Tablet By Mouth Daily 3)  Lipitor 40 Mg Tabs (Atorvastatin Calcium) .Marland Kitchen.. 1 Tablet By Mouth Nightly For Cholesterol 4)  Loratadine 10 Mg  Tabs (Loratadine) .Marland Kitchen.. 1 Tablet By Mouth Daily For Allergies 5)  Flexeril 10 Mg  Tabs (Cyclobenzaprine Hcl) .... One Tablet By Mouth Four Times Daily **rx By Neuro** 6)  Oxycodone Hcl 15 Mg Tabs (Oxycodone Hcl) .... Take 4 Times Daily As Needed  **rx By Neuro** 7)  Kadian 30 Mg Xr24h-Cap (Morphine Sulfate) .Marland Kitchen.. 1 in The Morning and 2 in The Evening  **rx By  Alton Revere** 8)  Omeprazole 20 Mg Tbec (Omeprazole) .... One Tablet By Mouth Two Times A Day 30 Minutes Before The Meals For Stomach 9)  Ventolin Hfa 108 (90 Base) Mcg/act Aers (Albuterol Sulfate) .... 2 Puffs Every 6 Hours As Needed For Shortness of Breath  Allergies (verified): 1)  ! * Codiene 2)  ! * Lyrica 3)  ! Prednisone 4)  ! * Bee Stings  Social History: Does Patient Exercise:  yes  Review of Systems CV:  Denies chest pain or discomfort. Resp:  Denies cough. GI:  Denies abdominal pain, indigestion, nausea, and vomiting. MS:  Complains of joint pain and low back pain; chronic pain. Psych:  Complains of depression.  Physical Exam  General:  alert.   Head:  normocephalic.   Lungs:  normal breath sounds.   Heart:  normal rate and regular rhythm.   Abdomen:  normal bowel sounds.   Msk:  up to the exam table - slow and steady but no assist Neurologic:  alert & oriented X3.   Skin:  color normal.   Psych:  flat affect   Impression & Recommendations:  Problem # 1:  HYPERCHOLESTEROLEMIA (ICD-272.0) will refill medications and check labs His updated medication list for this problem includes:    Lipitor 40 Mg Tabs (Atorvastatin calcium) .Marland Kitchen... 1 tablet by mouth nightly for cholesterol  Orders: T-Lipid Profile (04540-98119) T-Comprehensive Metabolic Panel (14782-95621) Rapid HIV  (30865)  Problem # 2:  DEPRESSION (ICD-311) Pt admits that he has been to see Aquilla Solian - mental health counselor and he was given contact for counselors he admits that he has not followed up will consider adding another medication as pt has plateaued on current therapy His updated medication list for this problem includes:    Alprazolam 2 Mg Xr24h-tab (Alprazolam) .Marland Kitchen... 1 tablet by mouth daily as needed for nerves    Fluoxetine Hcl 40 Mg Caps (Fluoxetine hcl) .Marland Kitchen... 1 tablet by mouth daily  Orders: T-TSH (78469-62952)  Problem # 3:  ANXIETY (ICD-300.00)  His updated medication list for  this problem includes:    Alprazolam 2 Mg Xr24h-tab (Alprazolam) .Marland Kitchen... 1 tablet by mouth daily as needed for nerves    Fluoxetine Hcl 40 Mg Caps (Fluoxetine hcl) .Marland Kitchen... 1 tablet by mouth daily  Orders: T-TSH (84132-44010)  Problem # 4:  TOBACCO ABUSE (ICD-305.1) advise cessation  Complete Medication List: 1)  Alprazolam 2 Mg Xr24h-tab (Alprazolam) .Marland Kitchen.. 1 tablet by mouth daily as needed for nerves 2)  Fluoxetine Hcl 40 Mg Caps (Fluoxetine hcl) .Marland Kitchen.. 1 tablet by mouth daily 3)  Lipitor 40 Mg Tabs (Atorvastatin calcium) .Marland Kitchen.. 1 tablet by mouth nightly  for cholesterol 4)  Flexeril 10 Mg Tabs (Cyclobenzaprine hcl) .... One tablet by mouth four times daily **rx by neuro** 5)  Oxycodone Hcl 15 Mg Tabs (Oxycodone hcl) .... Take 4 times daily as needed  **rx by neuro** 6)  Morphine Sulfate Cr 60 Mg Xr12h-tab (Morphine sulfate) .... Two tablets by mouth two times a day  **rx by pain management** 7)  Omeprazole 20 Mg Tbec (Omeprazole) .... One tablet by mouth two times a day 30 minutes before the meals for stomach 8)  Ventolin Hfa 108 (90 Base) Mcg/act Aers (Albuterol sulfate) .... 2 puffs every 6 hours as needed for shortness of breath 9)  Meloxicam 7.5 Mg/10ml Susp (Meloxicam) .... One tablet by mouth two times a day  **rx by pain**  Other Orders: T-CBC w/Diff (04540-98119)  Lipid Assessment/Plan:      Based on NCEP/ATP III, the patient's risk factor category is "0-1 risk factors".  The patient's lipid goals are as follows: Total cholesterol goal is 200; LDL cholesterol goal is 160; HDL cholesterol goal is 40; Triglyceride goal is 150.     Patient Instructions: 1)  Going the gym is a very good thing for your to do.  The water therapy is good for your joints. 2)  Medications refilled. 3)  Keep your appointment with pain managment as currently scheduled. 4)  Depression - You need to call the numbers you have for follow up. 5)  You are currently taking 2 medications but perhaps we need to discuss  adding another medication to help lift your spirits. 6)  Call to schedule a follow up in 6 weeks for depression. Prescriptions: OMEPRAZOLE 20 MG TBEC (OMEPRAZOLE) One tablet by mouth two times a day 30 minutes before the meals for stomach  #30 x 11   Entered and Authorized by:   Lehman Prom FNP   Signed by:   Lehman Prom FNP on 12/19/2010   Method used:   Print then Give to Patient   RxID:   [id number] LIPITOR 40 MG TABS (ATORVASTATIN CALCIUM) 1 tablet by mouth nightly for cholesterol  #30 x 11   Entered and Authorized by:   Lehman Prom FNP   Signed by:   Lehman Prom FNP on 12/19/2010   Method used:   Print then Give to Patient   RxID:   [id number] FLUOXETINE HCL 40 MG CAPS (FLUOXETINE HCL) 1 tablet by mouth daily  #30 x 11   Entered and Authorized by:   Lehman Prom FNP   Signed by:   Lehman Prom FNP on 12/19/2010   Method used:   Print then Give to Patient   RxID:   [id number]    Orders Added: 1)  Est. Patient Level III [42595] 2)  T-Lipid Profile [63875-64332] 3)  T-Comprehensive Metabolic Panel [80053-22900] 4)  T-CBC w/Diff [95188-41660] 5)  Rapid HIV  [92370] 6)  T-TSH [63016-01093]    Laboratory Results  Date/Time Received: December 19, 2010   Other Tests  Rapid HIV: negative

## 2011-01-07 ENCOUNTER — Telehealth (INDEPENDENT_AMBULATORY_CARE_PROVIDER_SITE_OTHER): Payer: Self-pay | Admitting: Nurse Practitioner

## 2011-01-15 NOTE — Progress Notes (Signed)
Summary: alprazolam refill  Phone Note Call from Patient   Summary of Call: Wants refill of alprazolam - uses [facility]. Initial call taken by: Dutch Quint RN,  January 07, 2011 11:41 AM  Follow-up for Phone Call        rx printed to be faxed to pharmacy advise pt to check there for avalibilty Follow-up by: Lehman Prom FNP,  January 07, 2011 1:21 PM  Additional Follow-up for Phone Call Additional follow up Details #1::        Left message on answering machine for pt. to return call.  Dutch Quint RN  January 07, 2011 4:10 PM  Has already picked up Rx.  Dutch Quint RN  January 08, 2011 3:30 PM     Prescriptions: ALPRAZOLAM 2 MG XR24H-TAB (ALPRAZOLAM) 1 tablet by mouth daily as needed for nerves  #30 x 0   Entered and Authorized by:   Lehman Prom FNP   Signed by:   Lehman Prom FNP on 01/07/2011   Method used:   Printed then faxed to ...       [facility]* (retail)       [address]       [place], [place]  [address]       Ph: [phone]       Fax: [phone]   RxID:   [id number]

## 2011-01-23 ENCOUNTER — Emergency Department (HOSPITAL_COMMUNITY)
Admission: EM | Admit: 2011-01-23 | Discharge: 2011-01-23 | Disposition: A | Discharge status: Home / Self Care | Payer: Medicare Other | Attending: Emergency Medicine | Admitting: Emergency Medicine

## 2011-01-23 DIAGNOSIS — G8929 Other chronic pain: Secondary | ICD-10-CM | POA: Insufficient documentation

## 2011-01-23 DIAGNOSIS — F17200 Nicotine dependence, unspecified, uncomplicated: Secondary | ICD-10-CM | POA: Insufficient documentation

## 2011-01-23 DIAGNOSIS — F19939 Other psychoactive substance use, unspecified with withdrawal, unspecified: Secondary | ICD-10-CM | POA: Insufficient documentation

## 2011-01-23 DIAGNOSIS — R197 Diarrhea, unspecified: Secondary | ICD-10-CM | POA: Insufficient documentation

## 2011-01-23 DIAGNOSIS — F1120 Opioid dependence, uncomplicated: Secondary | ICD-10-CM | POA: Insufficient documentation

## 2011-01-23 DIAGNOSIS — R112 Nausea with vomiting, unspecified: Secondary | ICD-10-CM | POA: Insufficient documentation

## 2011-01-28 ENCOUNTER — Encounter (INDEPENDENT_AMBULATORY_CARE_PROVIDER_SITE_OTHER): Payer: Self-pay | Admitting: Nurse Practitioner

## 2011-01-30 ENCOUNTER — Encounter: Payer: Self-pay | Admitting: Nurse Practitioner

## 2011-01-30 ENCOUNTER — Encounter (INDEPENDENT_AMBULATORY_CARE_PROVIDER_SITE_OTHER): Payer: Self-pay | Admitting: Nurse Practitioner

## 2011-02-01 ENCOUNTER — Encounter (INDEPENDENT_AMBULATORY_CARE_PROVIDER_SITE_OTHER): Payer: Self-pay | Admitting: Nurse Practitioner

## 2011-02-05 NOTE — Assessment & Plan Note (Signed)
Summary: Depression   Vital Signs:  Patient profile:   40 year old male Weight:      189.1 pounds BMI:     26.47 Temp:     98.9 degrees F oral Pulse rate:   88 / minute Pulse rhythm:   regular Resp:     20 per minute BP sitting:   125 / 85  (left arm) Cuff size:   regular  Vitals Entered By: [name] (January 30, 2011 11:02 AM)  Nutrition Counseling: Patient's BMI is greater than 25 and therefore counseled on weight management options. CC: follow-up visit 6 weeks depression, Depression Is Patient Diabetic? No Pain Assessment Patient in pain? yes     Location: leg, side, foot  Does patient need assistance? Functional Status Self care Ambulation Normal   CC:  follow-up visit 6 weeks depression and Depression.  History of Present Illness:  Pt into the office for 6 week f/u on depression.  Chronic Pain Meds - Pt has been going to the pain clinic however he has not had any pain medications in the past month.  Pt recently took a urine drug screen, he reports on 01/16/2011   and is still waiting on the results so that he can resume getting his meds.   "I was going out of my mind" without the meds BP was elevated, mind was racing, not able to sleep or eat. Pt was prescribed ondanseteron, zolpidem and clonidine at the ER for the withdrawl symptoms. Now the pt has come through the worst part of the withdrawl symptoms he ? if he should be on the strong pain meds.  However, without the medications he has the chronic pain and is barely able to get around.  Pt presents today with all the bottles of medications.  Depression History:      Positive alarm features for depression include fatigue (loss of energy).  However, he denies recurrent thoughts of death or suicide.        Psychosocial stress factors include major life changes.  The patient denies that he feels like life is not worth living, denies that he wishes that he were dead, and denies that he has thought about ending his  life.         Depression Treatment History:  Prior Medication Used:   Start Date: Assessment of Effect:   Comments:  Prozac (fluoxetine)     --       --       ongoing symptoms   Habits & Providers  Alcohol-Tobacco-Diet     Alcohol drinks/day: 0     Tobacco Status: current     Tobacco Counseling: to quit use of tobacco products     Cigarette Packs/Day: <0.25  Exercise-Depression-Behavior     Does Patient Exercise: yes     Exercise Counseling: to improve exercise regimen     Depression Counseling: not indicated; screening negative for depression     Drug Use: no     Seat Belt Use: 100     Sun Exposure: occasionally  Allergies (verified): 1)  ! * Codiene 2)  ! * Lyrica 3)  ! Prednisone 4)  ! * Bee Stings  Social History: Packs/Day:  <0.25  Review of Systems General:  Denies fever. CV:  Denies chest pain or discomfort. Resp:  Denies cough. GI:  Denies abdominal pain, nausea, and vomiting. MS:  Complains of joint pain, low back pain, and stiffness.  Physical Exam  General:  alert.   Head:  normocephalic.   Neurologic:  flat affect Skin:  color normal.   Psych:  Oriented X3.     Impression & Recommendations:  Problem # 1:  DEPRESSION (ICD-311) mental health counselor into the room to discuss case with pt will speak with Dr. Tresa Endo about medication adjustment His updated medication list for this problem includes:    Alprazolam 2 Mg Xr24h-tab (Alprazolam) .Marland Kitchen... 1 tablet by mouth daily as needed for nerves    Fluoxetine Hcl 40 Mg Caps (Fluoxetine hcl) .Marland Kitchen... 1 tablet by mouth daily  Problem # 2:  BACK PAIN (ICD-724.5) advised pt to reconnect with pain clinic no chronic meds in this office His updated medication list for this problem includes:    Flexeril 10 Mg Tabs (Cyclobenzaprine hcl) ..... One tablet by mouth four times daily **rx by neuro**    Oxycodone Hcl 15 Mg Tabs (Oxycodone hcl) .Marland Kitchen... Take 4 times daily as needed  **rx by neuro**    Morphine Sulfate Cr  60 Mg Xr12h-tab (Morphine sulfate) .Marland Kitchen..Marland Kitchen Two tablets by mouth two times a day  **rx by pain management**    Meloxicam 7.5 Mg/14ml Susp (Meloxicam) ..... One tablet by mouth two times a day  **rx by pain**  Complete Medication List: 1)  Alprazolam 2 Mg Xr24h-tab (Alprazolam) .Marland Kitchen.. 1 tablet by mouth daily as needed for nerves 2)  Fluoxetine Hcl 40 Mg Caps (Fluoxetine hcl) .Marland Kitchen.. 1 tablet by mouth daily 3)  Lipitor 40 Mg Tabs (Atorvastatin calcium) .Marland Kitchen.. 1 tablet by mouth nightly for cholesterol 4)  Flexeril 10 Mg Tabs (Cyclobenzaprine hcl) .... One tablet by mouth four times daily **rx by neuro** 5)  Oxycodone Hcl 15 Mg Tabs (Oxycodone hcl) .... Take 4 times daily as needed  **rx by neuro** 6)  Morphine Sulfate Cr 60 Mg Xr12h-tab (Morphine sulfate) .... Two tablets by mouth two times a day  **rx by pain management** 7)  Omeprazole 20 Mg Tbec (Omeprazole) .... One tablet by mouth two times a day 30 minutes before the meals for stomach 8)  Ventolin Hfa 108 (90 Base) Mcg/act Aers (Albuterol sulfate) .... 2 puffs every 6 hours as needed for shortness of breath 9)  Meloxicam 7.5 Mg/53ml Susp (Meloxicam) .... One tablet by mouth two times a day  **rx by pain** 10)  Lovaza 1 Gm Caps (Omega-3-acid ethyl esters) .... One capsule by mouth two times a day  Patient Instructions: 1)  Depression - Mental Health counselor will talk to you today in office. 2)  You will likely have some other sessions. 3)  Pain Clinic - Try to reach out to the pain clinic and attempt to get your results and see if they will resume your medications OR speak with them about other alternatives and if it is really your desire to be on such strong medications. 4)  This office will NOT resume responsibility for pain meds. 5)  Follow up as needed   Orders Added: 1)  Est. Patient Level III [69629]

## 2011-02-05 NOTE — Miscellaneous (Signed)
Summary: Med change  Clinical Lists Changes  Spoke with [place], LCSW regarding pt's mental health.  Pt was discussed with Dr. Tresa Endo and he advised to increase fluoxetine to 60mg  by mouth daily. Will increase meds and pt will f/u with [facility] in 3 weeks  Medications: Added new medication of FLUOXETINE HCL 20 MG CAPS (FLUOXETINE HCL) One capsule by mouth daily  **Take with 40mg  dose** - Signed Rx of FLUOXETINE HCL 20 MG CAPS (FLUOXETINE HCL) One capsule by mouth daily  **Take with 40mg  dose**;  #30 x 11;  Signed;  Entered by: Lehman Prom FNP;  Authorized by: Lehman Prom FNP;  Method used: Electronically to [facility]*, [address] [place], [place]  [address], Ph: [phone], Fax: [phone]    Prescriptions: FLUOXETINE HCL 20 MG CAPS (FLUOXETINE HCL) One capsule by mouth daily  **Take with 40mg  dose**  #30 x 11   Entered and Authorized by:   Lehman Prom FNP   Signed by:   Lehman Prom FNP on 02/01/2011   Method used:   Electronically to        [facility]* (retail)       [address]       [place], [place]  [address]       Ph: [phone]       Fax: [phone]   RxID:   [id number]

## 2011-02-05 NOTE — Medication Information (Signed)
Summary: [facility]OME[facility]//CHANGED  [facility]//CHANGED   Imported By: Arta Bruce 01/30/2011 15:39:06  _____________________________________________________________________  External Attachment:    Type:   Image     Comment:   External Document

## 2011-03-22 NOTE — Op Note (Signed)
   NAME:  Jeremy Delgado, MACEACHERN                         ACCOUNT NO.:  [id number]   MEDICAL RECORD NO.:  [id number]                   PATIENT TYPE:  OIB   LOCATION:  2857                                 FACILITY:  [facility]   PHYSICIAN:  Abigail Miyamoto, M.D.              DATE OF BIRTH:  07/11/1971   DATE OF PROCEDURE:  05/19/2003  DATE OF DISCHARGE:                                 OPERATIVE REPORT   PREOPERATIVE DIAGNOSIS:  Hemorrhoids.   POSTOPERATIVE DIAGNOSIS:  Hemorrhoids.   OPERATION PERFORMED:  Hemorrhoidectomy.   SURGEON:  Douglas A. Magnus Ivan, M.D.   ASSISTANT:  Gabrielle Dare. Janee Morn, MD   ANESTHESIA:  General endotracheal and 0.5% Marcaine with epinephrine.   ESTIMATED BLOOD LOSS:  Minimal.   INDICATIONS FOR PROCEDURE:  [name] is a 40 year old gentleman who has  had severe inflamed  hemorrhoids for greater than 10 years that have  persisted despite medical therapy.  Therefore he presents for  hemorrhoidectomy.   DESCRIPTION OF PROCEDURE:  The patient was brought to the operating room and  identified as [name].  He was placed supine on the operating table  and general anesthesia was induced.  The patient was then placed in prone  position.  His perineum and perianal area were then prepped and draped in  the usual sterile fashion.  A Hill-Ferguson retractor was then inserted into  the anal canal.  Circumferential examination of the anal canal was then  performed.  No abnormalities were identified other than the large skin  tagging and inflamed external hemorrhoids on the left lateral side.  These  hemorrhoids were then grabbed with an Allis clamp, elevated and excised with  the electrocautery.  The defect was then closed with interrupted 2-0 chromic  sutures.  The wound was then anesthetized with 0.5% Marcaine with  epinephrine.  A piece of Gelfoam was then inserted into the anal canal.  The  patient was then placed back into the supine position, extubated in the  operating room and taken in stable condition to the recovery room.  All  sponge, needle and instrument counts were correct at the end of the  procedure.                                                    Abigail Miyamoto, M.D.    DB/MEDQ  D:  05/19/2003  T:  05/20/2003  Job:  [id number]

## 2011-08-02 LAB — BASIC METABOLIC PANEL
BUN: 8
CO2: 28
Calcium: 9.4
Chloride: 105
Creatinine, Ser: 0.7
GFR calc Af Amer: >60
GFR calc non Af Amer: >60
Glucose, Bld: 98
Potassium: 4.1
Sodium: 137

## 2011-08-02 LAB — DIFFERENTIAL
Basophils Absolute: 0
Basophils Relative: 0
Eosinophils Absolute: 0.1
Eosinophils Relative: 1
Lymphocytes Relative: 27
Lymphs Abs: 3
Monocytes Absolute: 0.7
Monocytes Relative: 7
Neutro Abs: 7.3
Neutrophils Relative %: 65

## 2011-08-02 LAB — URINALYSIS, ROUTINE W REFLEX MICROSCOPIC
Bilirubin Urine: NEGATIVE
Glucose, UA: NEGATIVE
Hgb urine dipstick: NEGATIVE
Ketones, ur: NEGATIVE
Nitrite: NEGATIVE
Protein, ur: NEGATIVE
Specific Gravity, Urine: 1.008
Urobilinogen, UA: 0.2
pH: 5.5

## 2011-08-02 LAB — CBC
HCT: 42.9
Hemoglobin: 14.8
MCHC: 34.6
MCV: 90
Platelets: 331
RBC: 4.77
RDW: 13.6
WBC: 11.2 — ABNORMAL HIGH

## 2012-04-29 ENCOUNTER — Ambulatory Visit (HOSPITAL_COMMUNITY)
Admission: RE | Admit: 2012-04-29 | Discharge: 2012-04-29 | Disposition: A | Discharge status: Home / Self Care | Payer: No Typology Code available for payment source | Attending: Psychiatry | Admitting: Psychiatry

## 2012-04-29 DIAGNOSIS — F3289 Other specified depressive episodes: Secondary | ICD-10-CM | POA: Insufficient documentation

## 2012-04-29 DIAGNOSIS — Z658 Other specified problems related to psychosocial circumstances: Secondary | ICD-10-CM | POA: Insufficient documentation

## 2012-04-29 DIAGNOSIS — Z888 Allergy status to other drugs, medicaments and biological substances status: Secondary | ICD-10-CM | POA: Insufficient documentation

## 2012-04-29 DIAGNOSIS — F329 Major depressive disorder, single episode, unspecified: Secondary | ICD-10-CM | POA: Insufficient documentation

## 2012-04-29 NOTE — BH Assessment (Signed)
Assessment Note   Jeremy Delgado is an 41 y.o. male. Presented as a walk-in to [facility] for an eval for services and admission. He reports feeling angry more often and temper is quicker. 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 He has not hurt anyone and states son could "kick his ass" but is concerned with his temper. Receives his pain medications from a Dr. At [facility] in [place] and his Xanax and Prozac from [place]. He states several months ago because it was such a hassle to get both a 40mg  and 20mg  pill he stopped taking them both and for the past several months is just been taking 40mg .States feels he was doing better when on 60mg  as far as his temper was concerned.  Would like to be hospitalized but not meeting criteria. He denies being suicidal or homicidal now or in the past. He states he is religious and suicide is the ultimate sin so he would never do it. Denies hallucinating or having delusions. States would benefit from talking to someone but since he doesn't drive and his mom is his only transportation getting to appts is difficult.Discussed with him a referral to IOP but he states due to his transportation issues cant keep such frequent appts. Referred him then to [facility] for outpt therapy and medication management. Did also send him home with IOP information if changes his mind and the suicidal pathways information. Expressed his intention of following up with [facility], states would like to talk with someone on a regular basis. Understood hospitalization is not for therapy and for him to take a break as he stated is why he wanted to be admitted, those were his goals.   Axis I: Depressive Disorder NOS Axis II: Deferred Axis III: No past medical history on file. Axis IV: economic problems, housing problems, occupational problems, problems with access to health care services and problems with primary  support group Axis V: 41-50 serious symptoms  Past Medical History: No past medical history on file.  No past surgical history on file.  Family History: No family history on file.  Social History:  does not have a smoking history on file. He does not have any smokeless tobacco history on file. His alcohol and drug histories not on file.  Additional Social History:  Alcohol / Drug Use Pain Medications: not abusing Prescriptions: not abusing Over the Counter: NOS History of alcohol / drug use?: No history of alcohol / drug abuse  CIWA:   COWS:    Allergies:  Allergies  Allergen Reactions  . Codeine   . Prednisone   . Pregabalin     Home Medications:  (Not in a hospital admission)  OB/GYN Status:  No LMP for male patient.  General Assessment Data Location of Assessment: [facility] Assessment Services Living Arrangements: Parent;Children Can pt return to current living arrangement?: Yes Admission Status: Other (Comment) (not being admitted referred to outpatient services) Referral Source: Self/Family/Friend  Education Status Is patient currently in school?: No  Risk to self Suicidal Ideation: No Suicidal Intent: No Is patient at risk for suicide?: No Suicidal Plan?: No Access to Means: No What has been your use of drugs/alcohol within the last 12 months?:  (dnies abusing prescriptions) Previous Attempts/Gestures: No Intentional Self Injurious Behavior: None Family Suicide History: No Recent stressful life event(s): Job Loss;Financial Problems Persecutory voices/beliefs?: No Depression: Yes Depression Symptoms: Isolating;Loss of interest  in usual pleasures;Feeling angry/irritable Substance abuse history and/or treatment for substance abuse?: No Suicide prevention information given to non-admitted patients: Not applicable  Risk to Others Homicidal Ideation: No Thoughts of Harm to Others: No Current Homicidal Intent: No Current Homicidal Plan: No Access to Homicidal  Means: No History of harm to others?: No Assessment of Violence: None Noted Does patient have access to weapons?: No Criminal Charges Pending?: No Does patient have a court date: No  Psychosis Hallucinations: None noted Delusions: None noted  Mental Status Report Appear/Hygiene:  (unremarkable) Eye Contact: Fair Motor Activity: Shuffling Speech: Logical/coherent Level of Consciousness: Alert Mood: Depressed;Irritable Affect: Blunted Anxiety Level: Minimal Thought Processes: Coherent Judgement: Unimpaired Orientation: Person;Place;Time;Situation Obsessive Compulsive Thoughts/Behaviors: None  Cognitive Functioning Concentration: Normal Memory: Recent Intact;Remote Intact IQ: Average Insight: Fair Impulse Control: Good Appetite: Good Sleep: No Change Vegetative Symptoms: None  ADLScreening [facility] Assessment Services) Patient's cognitive ability adequate to safely complete daily activities?: Yes Patient able to express need for assistance with ADLs?: Yes Independently performs ADLs?: Yes  Abuse/Neglect [facility]) Physical Abuse: Denies Verbal Abuse: Denies Sexual Abuse: Denies  Prior Inpatient Therapy Prior Inpatient Therapy: No  Prior Outpatient Therapy Prior Outpatient Therapy: No  ADL Screening (condition at time of admission) Patient's cognitive ability adequate to safely complete daily activities?: Yes Patient able to express need for assistance with ADLs?: Yes Independently performs ADLs?: Yes Weakness of Legs: None Weakness of Arms/Hands: None  Home Assistive Devices/Equipment Home Assistive Devices/Equipment: None    Abuse/Neglect Assessment (Assessment to be complete while patient is alone) Physical Abuse: Denies Verbal Abuse: Denies Sexual Abuse: Denies Exploitation of patient/patient's resources: Denies Self-Neglect: Denies Values / Beliefs Cultural Requests During Hospitalization: None Spiritual Requests During Hospitalization: None       Nutrition Screen Diet: Regular Unintentional weight loss greater than 10lbs within the last month: No Problems chewing or swallowing foods and/or liquids: No Home Tube Feeding or Total Parenteral Nutrition (TPN): No Patient appears severely malnourished: No  Additional Information 1:1 In Past 12 Months?: No Does patient have medical clearance?: No     Disposition:  Disposition Disposition of Patient: Outpatient treatment Type of outpatient treatment: Adult  On Site Evaluation by:   Reviewed with Physician:     Wynona Luna 04/29/2012 4:00 PM

## 2012-06-04 ENCOUNTER — Emergency Department: Payer: Self-pay | Admitting: Emergency Medicine

## 2012-09-04 ENCOUNTER — Emergency Department: Payer: Self-pay | Admitting: Emergency Medicine

## 2013-08-23 ENCOUNTER — Emergency Department: Payer: Self-pay | Admitting: Emergency Medicine

## 2013-08-23 LAB — COMPREHENSIVE METABOLIC PANEL
Albumin: 3.9 g/dL (ref 3.4–5.0)
Alkaline Phosphatase: 125 U/L (ref 50–136)
Anion Gap: 4 — ABNORMAL LOW (ref 7–16)
BUN: 15 mg/dL (ref 7–18)
Bilirubin,Total: 0.3 mg/dL (ref 0.2–1.0)
Calcium, Total: 9.2 mg/dL (ref 8.5–10.1)
Chloride: 102 mmol/L (ref 98–107)
Co2: 30 mmol/L (ref 21–32)
EGFR (Non-African Amer.): >60
Glucose: 81 mg/dL (ref 65–99)
SGPT (ALT): 36 U/L (ref 12–78)
Total Protein: 8 g/dL (ref 6.4–8.2)

## 2013-08-23 LAB — T4, FREE: Free Thyroxine: 1.15 ng/dL (ref 0.76–1.46)

## 2013-08-23 LAB — CBC
HGB: 15.3 g/dL (ref 13.0–18.0)
MCHC: 34.6 g/dL (ref 32.0–36.0)
Platelet: 326 10*3/uL (ref 150–440)
RBC: 5.19 10*6/uL (ref 4.40–5.90)
WBC: 7.7 10*3/uL (ref 3.8–10.6)

## 2013-08-23 LAB — URINALYSIS, COMPLETE
Bacteria: NONE SEEN
Glucose,UR: NEGATIVE mg/dL (ref 0–75)
Ketone: NEGATIVE
Leukocyte Esterase: NEGATIVE
Nitrite: NEGATIVE
Ph: 5 (ref 4.5–8.0)
Protein: NEGATIVE
Specific Gravity: 1.021 (ref 1.003–1.030)
Squamous Epithelial: NONE SEEN
WBC UR: <1 /HPF (ref 0–5)

## 2013-08-23 LAB — DRUG SCREEN, URINE
Methadone, Ur Screen: NEGATIVE (ref ?–300)
Tricyclic, Ur Screen: POSITIVE (ref ?–1000)

## 2013-08-23 LAB — ETHANOL: Ethanol %: <0.003 % (ref 0.000–0.080)

## 2013-08-23 LAB — TSH: Thyroid Stimulating Horm: 5.07 u[IU]/mL — ABNORMAL HIGH

## 2014-01-03 ENCOUNTER — Encounter (HOSPITAL_COMMUNITY): Payer: Self-pay | Admitting: Emergency Medicine

## 2014-01-03 DIAGNOSIS — R209 Unspecified disturbances of skin sensation: Secondary | ICD-10-CM | POA: Insufficient documentation

## 2014-01-03 DIAGNOSIS — R109 Unspecified abdominal pain: Secondary | ICD-10-CM | POA: Insufficient documentation

## 2014-01-03 DIAGNOSIS — Z87891 Personal history of nicotine dependence: Secondary | ICD-10-CM | POA: Insufficient documentation

## 2014-01-03 DIAGNOSIS — Z79899 Other long term (current) drug therapy: Secondary | ICD-10-CM | POA: Insufficient documentation

## 2014-01-03 DIAGNOSIS — N509 Disorder of male genital organs, unspecified: Secondary | ICD-10-CM | POA: Insufficient documentation

## 2014-01-03 DIAGNOSIS — M545 Low back pain, unspecified: Secondary | ICD-10-CM | POA: Insufficient documentation

## 2014-01-03 DIAGNOSIS — G8929 Other chronic pain: Secondary | ICD-10-CM | POA: Insufficient documentation

## 2014-01-03 DIAGNOSIS — I10 Essential (primary) hypertension: Secondary | ICD-10-CM | POA: Insufficient documentation

## 2014-01-03 DIAGNOSIS — M6281 Muscle weakness (generalized): Secondary | ICD-10-CM | POA: Insufficient documentation

## 2014-01-03 DIAGNOSIS — M25559 Pain in unspecified hip: Secondary | ICD-10-CM | POA: Insufficient documentation

## 2014-01-03 DIAGNOSIS — M129 Arthropathy, unspecified: Secondary | ICD-10-CM | POA: Insufficient documentation

## 2014-01-03 NOTE — ED Notes (Signed)
Pt c/o back and leg pain for the past 10 years. Pt also reports left side pain that radiates to his left groin. This pain is not new. Pt is A&Ox4, respirations equal and unlabored, skin warm and dry. Pt denies any urinary symptoms.

## 2014-01-04 ENCOUNTER — Emergency Department (HOSPITAL_COMMUNITY)
Admission: EM | Admit: 2014-01-04 | Discharge: 2014-01-04 | Disposition: A | Discharge status: Home / Self Care | Payer: Medicare Other | Attending: Emergency Medicine | Admitting: Emergency Medicine

## 2014-01-04 DIAGNOSIS — G8929 Other chronic pain: Secondary | ICD-10-CM

## 2014-01-04 DIAGNOSIS — M549 Dorsalgia, unspecified: Secondary | ICD-10-CM

## 2014-01-04 HISTORY — DX: Unspecified osteoarthritis, unspecified site: M19.90

## 2014-01-04 HISTORY — DX: Essential (primary) hypertension: I10

## 2014-01-04 HISTORY — DX: Other chronic pain: G89.29

## 2014-01-04 HISTORY — DX: Sciatica, unspecified side: M54.30

## 2014-01-04 LAB — URINALYSIS, ROUTINE W REFLEX MICROSCOPIC
BILIRUBIN URINE: NEGATIVE
GLUCOSE, UA: NEGATIVE mg/dL
Hgb urine dipstick: NEGATIVE
KETONES UR: NEGATIVE mg/dL
LEUKOCYTES UA: NEGATIVE
Nitrite: NEGATIVE
PROTEIN: NEGATIVE mg/dL
Specific Gravity, Urine: 1.024 (ref 1.005–1.030)
UROBILINOGEN UA: 0.2 mg/dL (ref 0.0–1.0)
pH: 5.5 (ref 5.0–8.0)

## 2014-01-04 MED ORDER — KETOROLAC TROMETHAMINE 60 MG/2ML IM SOLN
60.0000 mg | Freq: Once | INTRAMUSCULAR | Status: AC
  Administered 2014-01-04: 60 mg via INTRAMUSCULAR
  Filled 2014-01-04: qty 2

## 2014-01-04 MED ORDER — KETOROLAC TROMETHAMINE 30 MG/ML IJ SOLN
INTRAMUSCULAR | Status: AC
  Filled 2014-01-04: qty 2

## 2014-01-04 MED ORDER — KETOROLAC TROMETHAMINE 30 MG/ML IJ SOLN: 60.0000 mg | Freq: Once | INTRAMUSCULAR | Status: DC

## 2014-01-04 MED ORDER — KETOROLAC TROMETHAMINE 60 MG/2ML IM SOLN: 60.0000 mg | Freq: Once | INTRAMUSCULAR | Status: DC

## 2014-01-04 MED ORDER — OXYCODONE-ACETAMINOPHEN 5-325 MG PO TABS
2.0000 | ORAL_TABLET | Freq: Once | ORAL | Status: AC
  Administered 2014-01-04: 2 via ORAL
  Filled 2014-01-04: qty 2

## 2014-01-04 NOTE — ED Notes (Signed)
Katie, PA-C at the bedside.

## 2014-01-04 NOTE — Discharge Instructions (Signed)
Follow up with your doctor as scheduled tomorrow.  Follow up with a neurosurgeon asap.  You should return to the ER if you develop change in or worsening of pain, fever (100.5 degrees or greater), inability to walk due to leg weakness or loss of control of bladder/bowels.

## 2014-01-04 NOTE — ED Provider Notes (Signed)
Medical screening examination/treatment/procedure(s) were performed by non-physician practitioner and as supervising physician I was immediately available for consultation/collaboration.  Neta Ehlers, MD 01/04/14 [phone]

## 2014-01-04 NOTE — ED Provider Notes (Signed)
CSN: [id number]     Arrival date & time 01/03/14  1936 History   First MD Initiated Contact with Patient 01/04/14 0023     Chief Complaint  Patient presents with  . Leg Pain  . Back Pain     (Consider location/radiation/quality/duration/timing/severity/associated sxs/prior Treatment) HPI History provided by pt.   Pt has had pain in left low back w/ radiation down LLE to great toe, as well as L abd and groin & testicle since 2005.  Pain worse today than normal.  Constant but aggravated by movement.  Associated w/ intermittent LLE paresthesias/weakness. Denies fever, N/V, change in bowels, GU sx.  Has been evaluated by neurology and primary care and treated w/ narcotics in the past, but he weaned himself off of them a couple months ago because he was becoming dependent on them.  Has f/u scheduled with his PCP tomorrow.  Past Medical History  Diagnosis Date  . Sciatica   . Chronic pain   . Hypertension   . Arthritis    Past Surgical History  Procedure Laterality Date  . Hemorrhoid surgery     History reviewed. No pertinent family history. History  Substance Use Topics  . Smoking status: Former Research scientist (life sciences)  . Smokeless tobacco: Never Used  . Alcohol Use: Yes    Review of Systems  All other systems reviewed and are negative.      Allergies  Bee pollen; Codeine; Prednisone; and Pregabalin  Home Medications   Current Outpatient Rx  Name  Route  Sig  Dispense  Refill  . ALPRAZolam (XANAX) 0.5 MG tablet   Oral   Take 0.5 mg by mouth 2 (two) times daily.         . cloNIDine (CATAPRES) 0.1 MG tablet   Oral   Take 0.1 mg by mouth 2 (two) times daily.         Marland Kitchen FLUoxetine (PROZAC) 40 MG capsule   Oral   Take 40 mg by mouth daily.         Marland Kitchen ibuprofen (ADVIL,MOTRIN) 200 MG tablet   Oral   Take 200 mg by mouth every 6 (six) hours as needed.         Marland Kitchen omeprazole (PRILOSEC) 20 MG capsule   Oral   Take 20 mg by mouth daily.          BP 125/58  Pulse 65  Temp(Src)  98.6 F (37 C) (Oral)  Resp 20  SpO2 97% Physical Exam  Nursing note and vitals reviewed. Constitutional: He is oriented to person, place, and time. He appears well-developed and well-nourished. No distress.  HENT:  Head: Normocephalic and atraumatic.  Eyes:  Normal appearance  Neck: Normal range of motion.  Cardiovascular: Normal rate and regular rhythm.   Pulmonary/Chest: Effort normal and breath sounds normal. No respiratory distress.  Abdominal: Soft. Bowel sounds are normal. He exhibits no distension and no mass. There is no rebound and no guarding.  Mild, diffuse left sided abd/pelvis ttp.    Genitourinary:  No CVA tenderness.  No genitalia rash.  No penile discharge.  Testicles descended bilaterally.  No masses.  Mild tenderness L testicle.   Musculoskeletal: Normal range of motion.  Mild tenderness lumbar spine and L lumbar musculature.  Mild tenderness L medial thigh as well.  Full ROM LLE and 5/5 hip abduction/adduction and ankle plantar/dorsiflexion strength.  No saddle anesthesia.  Nml patellar reflexes. 2+ DP pulse and distal sensation intact.    Neurological: He is alert and oriented to  person, place, and time.  Skin: Skin is warm and dry. No rash noted.  Psychiatric: He has a normal mood and affect. His behavior is normal.    ED Course  Procedures (including critical care time) Labs Review Labs Reviewed  URINALYSIS, ROUTINE W REFLEX MICROSCOPIC   Imaging Review No results found.   EKG Interpretation None      MDM   Final diagnoses:  None    43yo M presents w/ chronic L low back pain x 10 years.  All of the sx he is presenting with today are typical.  Has been evaluated by neurology and treated w/ narcotics in the past, but weaned himself off a couple months ago because he feared he was dependent on them.  On exam today, afebrile, non-toxic appearing and NAD, mild lumbar and left lumbar musculature tenderness, no NV deficits LLE, abd soft/non-distended, mild  L-sided abd and testicular ttp.  U/A neg for UTI. Pt received 2 percocet and 60mg  IM toradol w/ significant reduction in pain in ED.  Advised f/u as scheduled w/ his PCP tomorrow and I referred to NS.  Return precautions discussed.     Remer Macho, PA-C 01/04/14 [phone]

## 2014-01-04 NOTE — ED Notes (Signed)
Family at bedside. 

## 2015-01-18 ENCOUNTER — Emergency Department: Payer: Self-pay | Admitting: Emergency Medicine

## 2015-02-20 DIAGNOSIS — Z8739 Personal history of other diseases of the musculoskeletal system and connective tissue: Secondary | ICD-10-CM

## 2015-02-20 DIAGNOSIS — F331 Major depressive disorder, recurrent, moderate: Secondary | ICD-10-CM | POA: Insufficient documentation

## 2015-02-20 DIAGNOSIS — Z8639 Personal history of other endocrine, nutritional and metabolic disease: Secondary | ICD-10-CM | POA: Insufficient documentation

## 2015-02-20 DIAGNOSIS — M549 Dorsalgia, unspecified: Secondary | ICD-10-CM | POA: Insufficient documentation

## 2015-02-20 HISTORY — DX: Personal history of other diseases of the musculoskeletal system and connective tissue: Z87.39

## 2015-02-20 HISTORY — DX: Personal history of other endocrine, nutritional and metabolic disease: Z86.39

## 2015-02-24 NOTE — Consult Note (Signed)
Brief Consult Note: Diagnosis: Opioid Dependence, Mood Disorder NOS.   Patient was seen by consultant.   Recommend further assessment or treatment.   Orders entered.   Comments: Pt seen in [place]. He was also seen in [facility] clinic yesterday when he reported to me that he was "kicked out" by his pain clinic as his test is positive for hydromorphone and he was not abusing any meds. however, they have decided not to prescribe him any more meds. He was trying to taper himself of the meds. he stated that he was not feeling well so he went to [facility] and they referred him to ED due to insurance issues. he is now having some w/d symptoms, and having abdominal pain, feeling dizzy as he has not received his am doses of meds as well. he is not interested in going to [facility] as it will be too far from his mother.  Denied Si/HI or plans.   Plan: Pt will be given Opioid w/d medications and will be monitored closely as he does not meet criteria for admission.  Will refer to out pt Substance abuse Counselor when he is stable.  Continue other meds as prescribed.  Will continue to monitor.  Electronic Signatures: Jeronimo Norma (MD)  (Signed 21-Oct-14 13:29)  Authored: Brief Consult Note   Last Updated: 21-Oct-14 13:29 by Jeronimo Norma (MD)

## 2015-02-27 ENCOUNTER — Emergency Department
Admit: 2015-02-27 | Disposition: A | Discharge status: Home / Self Care | Payer: Self-pay | Admitting: Emergency Medicine

## 2015-02-27 LAB — URINALYSIS, COMPLETE
BILIRUBIN, UR: NEGATIVE
Bacteria: NONE SEEN
Glucose,UR: NEGATIVE mg/dL (ref 0–75)
KETONE: NEGATIVE
Leukocyte Esterase: NEGATIVE
Nitrite: NEGATIVE
PROTEIN: NEGATIVE
Ph: 5 (ref 4.5–8.0)
Specific Gravity: 1.015 (ref 1.003–1.030)
Squamous Epithelial: NONE SEEN

## 2015-02-27 LAB — COMPREHENSIVE METABOLIC PANEL
ALBUMIN: 3.5 g/dL
ANION GAP: 6 — AB (ref 7–16)
Alkaline Phosphatase: 58 U/L
BUN: 14 mg/dL
Bilirubin,Total: 0.2 mg/dL — ABNORMAL LOW
Calcium, Total: 8.5 mg/dL — ABNORMAL LOW
Chloride: 103 mmol/L
Co2: 26 mmol/L
Creatinine: 1.21 mg/dL
Glucose: 98 mg/dL
Potassium: 4.1 mmol/L
SGOT(AST): 31 U/L
SGPT (ALT): 49 U/L
Sodium: 135 mmol/L
Total Protein: 6.9 g/dL

## 2015-02-27 LAB — CBC WITH DIFFERENTIAL/PLATELET
BASOS ABS: 0 10*3/uL (ref 0.0–0.1)
BASOS PCT: 0.3 %
EOS ABS: 0 10*3/uL (ref 0.0–0.7)
Eosinophil %: 0.3 %
HCT: 39.7 % — ABNORMAL LOW (ref 40.0–52.0)
HGB: 13.6 g/dL (ref 13.0–18.0)
LYMPHS PCT: 21.8 %
Lymphocyte #: 1.1 10*3/uL (ref 1.0–3.6)
MCH: 29.7 pg (ref 26.0–34.0)
MCHC: 34.2 g/dL (ref 32.0–36.0)
MCV: 87 fL (ref 80–100)
Monocyte #: 0.7 x10 3/mm (ref 0.2–1.0)
Monocyte %: 13.5 %
NEUTROS PCT: 64.1 %
Neutrophil #: 3.1 10*3/uL (ref 1.4–6.5)
Platelet: 381 10*3/uL (ref 150–440)
RBC: 4.57 10*6/uL (ref 4.40–5.90)
RDW: 13.6 % (ref 11.5–14.5)
WBC: 4.9 10*3/uL (ref 3.8–10.6)

## 2015-02-27 LAB — LIPASE, BLOOD: Lipase: 28 U/L

## 2015-04-06 ENCOUNTER — Other Ambulatory Visit: Payer: Self-pay

## 2015-04-06 ENCOUNTER — Emergency Department
Admission: EM | Admit: 2015-04-06 | Discharge: 2015-04-06 | Disposition: A | Discharge status: Home / Self Care | Payer: Medicare Other | Attending: Student | Admitting: Student

## 2015-04-06 ENCOUNTER — Encounter: Payer: Self-pay | Admitting: Emergency Medicine

## 2015-04-06 ENCOUNTER — Emergency Department: Payer: Medicare Other

## 2015-04-06 ENCOUNTER — Encounter: Payer: Self-pay | Admitting: Psychiatry

## 2015-04-06 ENCOUNTER — Ambulatory Visit (INDEPENDENT_AMBULATORY_CARE_PROVIDER_SITE_OTHER): Payer: No Typology Code available for payment source | Admitting: Psychiatry

## 2015-04-06 VITALS — BP 124/76 | HR 75 | Temp 97.8°F | Ht 71.5 in | Wt 192.7 lb

## 2015-04-06 DIAGNOSIS — F4323 Adjustment disorder with mixed anxiety and depressed mood: Secondary | ICD-10-CM | POA: Diagnosis not present

## 2015-04-06 DIAGNOSIS — K529 Noninfective gastroenteritis and colitis, unspecified: Secondary | ICD-10-CM | POA: Diagnosis not present

## 2015-04-06 DIAGNOSIS — Z79899 Other long term (current) drug therapy: Secondary | ICD-10-CM | POA: Insufficient documentation

## 2015-04-06 DIAGNOSIS — F419 Anxiety disorder, unspecified: Secondary | ICD-10-CM | POA: Insufficient documentation

## 2015-04-06 DIAGNOSIS — K5289 Other specified noninfective gastroenteritis and colitis: Secondary | ICD-10-CM | POA: Diagnosis not present

## 2015-04-06 DIAGNOSIS — F410 Panic disorder [episodic paroxysmal anxiety] without agoraphobia: Secondary | ICD-10-CM | POA: Insufficient documentation

## 2015-04-06 DIAGNOSIS — R1032 Left lower quadrant pain: Secondary | ICD-10-CM

## 2015-04-06 DIAGNOSIS — F1920 Other psychoactive substance dependence, uncomplicated: Secondary | ICD-10-CM

## 2015-04-06 DIAGNOSIS — G8929 Other chronic pain: Secondary | ICD-10-CM | POA: Insufficient documentation

## 2015-04-06 DIAGNOSIS — I10 Essential (primary) hypertension: Secondary | ICD-10-CM | POA: Diagnosis not present

## 2015-04-06 DIAGNOSIS — F329 Major depressive disorder, single episode, unspecified: Secondary | ICD-10-CM | POA: Insufficient documentation

## 2015-04-06 DIAGNOSIS — R52 Pain, unspecified: Secondary | ICD-10-CM | POA: Insufficient documentation

## 2015-04-06 DIAGNOSIS — Z87891 Personal history of nicotine dependence: Secondary | ICD-10-CM | POA: Insufficient documentation

## 2015-04-06 HISTORY — DX: Other psychoactive substance dependence, uncomplicated: F19.20

## 2015-04-06 LAB — CBC WITH DIFFERENTIAL/PLATELET
Basophils Absolute: 0 10*3/uL (ref 0–0.1)
Basophils Relative: 0 %
Eosinophils Absolute: 0.2 10*3/uL (ref 0–0.7)
Eosinophils Relative: 3 %
HEMATOCRIT: 40.3 % (ref 40.0–52.0)
Hemoglobin: 13.7 g/dL (ref 13.0–18.0)
Lymphocytes Relative: 25 %
Lymphs Abs: 1.6 10*3/uL (ref 1.0–3.6)
MCH: 30.1 pg (ref 26.0–34.0)
MCHC: 34 g/dL (ref 32.0–36.0)
MCV: 88.4 fL (ref 80.0–100.0)
Monocytes Absolute: 0.5 10*3/uL (ref 0.2–1.0)
Monocytes Relative: 9 %
Neutro Abs: 4 10*3/uL (ref 1.4–6.5)
Neutrophils Relative %: 63 %
PLATELETS: 282 10*3/uL (ref 150–440)
RBC: 4.56 MIL/uL (ref 4.40–5.90)
RDW: 14.6 % — ABNORMAL HIGH (ref 11.5–14.5)
WBC: 6.4 10*3/uL (ref 3.8–10.6)

## 2015-04-06 LAB — URINALYSIS COMPLETE WITH MICROSCOPIC (ARMC ONLY)
Bacteria, UA: NONE SEEN
Bilirubin Urine: NEGATIVE
Glucose, UA: NEGATIVE mg/dL
KETONES UR: NEGATIVE mg/dL
Leukocytes, UA: NEGATIVE
Nitrite: NEGATIVE
PH: 7 (ref 5.0–8.0)
PROTEIN: NEGATIVE mg/dL
Specific Gravity, Urine: 1.014 (ref 1.005–1.030)
Squamous Epithelial / LPF: NONE SEEN
WBC, UA: NONE SEEN WBC/hpf (ref 0–5)

## 2015-04-06 LAB — COMPREHENSIVE METABOLIC PANEL
ALBUMIN: 3.9 g/dL (ref 3.5–5.0)
ALK PHOS: 50 U/L (ref 38–126)
ALT: 13 U/L — AB (ref 17–63)
AST: 14 U/L — AB (ref 15–41)
Anion gap: 6 (ref 5–15)
BILIRUBIN TOTAL: 0.2 mg/dL — AB (ref 0.3–1.2)
BUN: 19 mg/dL (ref 6–20)
CHLORIDE: 105 mmol/L (ref 101–111)
CO2: 28 mmol/L (ref 22–32)
CREATININE: 0.8 mg/dL (ref 0.61–1.24)
Calcium: 8.8 mg/dL — ABNORMAL LOW (ref 8.9–10.3)
GFR calc Af Amer: >60 mL/min (ref >60)
GFR calc non Af Amer: >60 mL/min (ref >60)
Glucose, Bld: 96 mg/dL (ref 65–99)
Potassium: 4.5 mmol/L (ref 3.5–5.1)
SODIUM: 139 mmol/L (ref 135–145)
TOTAL PROTEIN: 6.8 g/dL (ref 6.5–8.1)

## 2015-04-06 LAB — LIPASE, BLOOD: Lipase: 30 U/L (ref 22–51)

## 2015-04-06 MED ORDER — ONDANSETRON HCL 4 MG/2ML IJ SOLN
4.0000 mg | Freq: Once | INTRAMUSCULAR | Status: AC
  Administered 2015-04-06: 4 mg via INTRAVENOUS

## 2015-04-06 MED ORDER — ONDANSETRON HCL 4 MG/2ML IJ SOLN
INTRAMUSCULAR | Status: AC
  Administered 2015-04-06: 4 mg via INTRAVENOUS
  Filled 2015-04-06: qty 2

## 2015-04-06 MED ORDER — OXYCODONE-ACETAMINOPHEN 5-325 MG PO TABS: 1.0000 | ORAL_TABLET | Freq: Four times a day (QID) | ORAL | Status: DC | PRN

## 2015-04-06 MED ORDER — MORPHINE SULFATE 4 MG/ML IJ SOLN
INTRAMUSCULAR | Status: AC
  Administered 2015-04-06: 4 mg via INTRAVENOUS
  Filled 2015-04-06: qty 1

## 2015-04-06 MED ORDER — ALPRAZOLAM ER 0.5 MG PO TB24: 0.5000 mg | ORAL_TABLET | Freq: Two times a day (BID) | ORAL | Status: DC

## 2015-04-06 MED ORDER — IBUPROFEN 600 MG PO TABS: 600.0000 mg | ORAL_TABLET | Freq: Four times a day (QID) | ORAL | Status: DC | PRN

## 2015-04-06 MED ORDER — SODIUM CHLORIDE 0.9 % IV BOLUS (SEPSIS)
1000.0000 mL | Freq: Once | INTRAVENOUS | Status: AC
  Administered 2015-04-06: 1000 mL via INTRAVENOUS

## 2015-04-06 MED ORDER — IOHEXOL 240 MG/ML SOLN: 50.0000 mL | INTRAMUSCULAR | Status: AC

## 2015-04-06 MED ORDER — MORPHINE SULFATE 4 MG/ML IJ SOLN
4.0000 mg | Freq: Once | INTRAMUSCULAR | Status: AC
Start: 2015-04-06 — End: 2015-04-06
  Administered 2015-04-06: 4 mg via INTRAVENOUS

## 2015-04-06 MED ORDER — IOHEXOL 300 MG/ML  SOLN
100.0000 mL | Freq: Once | INTRAMUSCULAR | Status: AC | PRN
  Administered 2015-04-06: 100 mL via INTRAVENOUS

## 2015-04-06 MED ORDER — FLUOXETINE HCL 40 MG PO CAPS: 40.0000 mg | ORAL_CAPSULE | Freq: Every day | ORAL | Status: DC

## 2015-04-06 NOTE — ED Provider Notes (Signed)
[facility] Emergency Department Provider Note  ____________________________________________  Time seen: Approximately 12:47 PM  I have reviewed the triage vital signs and the nursing notes.   HISTORY  Chief Complaint Abdominal Pain    HPI Jeremy Ong. is a 44 y.o. male with history of depression, anxiety, chronic pain, hyperlipidemia presents for evaluation of one month of worsening left lower quadrant pain. Patient reports he was seen in the emergency department approximately one month ago and diagnosed with colitis, started on Cipro and Flagyl and his symptoms only mildly improved. Over the past week he has had worsening left lower quadrant pain. He describes it as constant and aching. It is not associated with any vomiting or diarrhea and he had a normal bowel movement today. He has been nauseated. He denies any chest pain or difficulty breathing. Movement makes the pain worse. Current severity is described as moderate. No recent illness including no cough, sneezing, or nose, congestion, no chest pain or difficulty breathing.   Past Medical History  Diagnosis Date  . Sciatica   . Chronic pain   . Hypertension   . Arthritis   . Anxiety   . Depression   . Colitis     self    Patient Active Problem List   Diagnosis Date Noted  . Addiction to drug 04/06/2015  . Combinations of drug dependence excluding opioid type drug 04/06/2015  . Pain 04/06/2015  . Episodic paroxysmal anxiety disorder 04/06/2015  . Noninfectious gastroenteritis and colitis 04/06/2015  . H/O arthritis 02/20/2015  . H/O elevated lipids 02/20/2015  . Backache 02/20/2015  . H/O: hypothyroidism 02/20/2015  . Depression, major, recurrent, moderate 02/20/2015  . PHARYNGITIS 01/31/2009  . COUGH 01/31/2009  . GERD 12/29/2008  . TOBACCO ABUSE 07/15/2008  . HYPERCHOLESTEROLEMIA 03/11/2008  . ANXIETY 03/11/2008  . PAIN IN JOINT PELVIC REGION AND THIGH 03/11/2008  . FLANK PAIN,  LEFT 03/11/2008  . BACK PAIN 02/17/2007  . DEPRESSION 11/05/2003    Past Surgical History  Procedure Laterality Date  . Hemorrhoid surgery      Current Outpatient Rx  Name  Route  Sig  Dispense  Refill  . ALPRAZolam (XANAX XR) 0.5 MG 24 hr tablet   Oral   Take 1 tablet (0.5 mg total) by mouth 2 (two) times daily.   60 tablet   3     Pt was given prescription   . Cyanocobalamin (VITAMIN B 12 PO)   Oral   Take 3 tablets by mouth daily.         Marland Kitchen FLUoxetine (PROZAC) 40 MG capsule   Oral   Take 1 capsule (40 mg total) by mouth daily.   30 capsule   3   . gabapentin (NEURONTIN) 300 MG capsule      1 capsule 2 (two) times daily.       3   . levothyroxine (SYNTHROID, LEVOTHROID) 50 MCG tablet   Oral   Take 50 mcg by mouth every morning.       0   . lisinopril (PRINIVIL,ZESTRIL) 10 MG tablet   Oral   Take 10 mg by mouth daily.       1   . omeprazole (PRILOSEC) 20 MG capsule   Oral   Take 20 mg by mouth daily.         . pravastatin (PRAVACHOL) 40 MG tablet   Oral   Take 40 mg by mouth every evening.         Marland Kitchen  triamcinolone cream (KENALOG) 0.1 %      TRIAMCINOLONE ACETONIDE, 0.1% (External Cream) - Historical Medication  daily (0.1 %) Active         . Melatonin 3 MG CAPS   Oral   Take 1 capsule by mouth at bedtime.           Allergies Bee pollen; Codeine; and Prednisone  Family History  Problem Relation Age of Onset  . Depression Mother   . Hypertension Father   . Heart failure Father   . Diabetes Paternal Grandfather     Social History History  Substance Use Topics  . Smoking status: Former Smoker    Quit date: 05/11/2012  . Smokeless tobacco: Never Used  . Alcohol Use: No    Review of Systems Constitutional: + subjective fever/chills Eyes: No visual changes. ENT: No sore throat. Cardiovascular: Denies chest pain. Respiratory: Denies shortness of breath. Gastrointestinal: +abdominal pain.  + nausea, no vomiting.  No  diarrhea.  No constipation. Genitourinary: Negative for dysuria. Musculoskeletal: Negative for back pain. Skin: Negative for rash. Neurological: Negative for headaches, focal weakness or numbness.  10-point ROS otherwise negative.  ____________________________________________   PHYSICAL EXAM:  VITAL SIGNS: ED Triage Vitals  Enc Vitals Group     BP 04/06/15 1107 115/83 mmHg     Pulse Rate 04/06/15 1107 69     Resp 04/06/15 1107 18     Temp 04/06/15 1107 98.5 F (36.9 C)     Temp Source 04/06/15 1107 Oral     SpO2 04/06/15 1107 99 %     Weight 04/06/15 1107 192 lb (87.091 kg)     Height 04/06/15 1107 5\' 11"  (1.803 m)     Head Cir --      Peak Flow --      Pain Score 04/06/15 1107 8     Pain Loc --      Pain Edu? --      Excl. in [place]? --     Constitutional: Alert and oriented. Well appearing and in no acute distress. Eyes: Conjunctivae are normal. PERRL. EOMI. Head: Atraumatic. Nose: No congestion/rhinnorhea. Mouth/Throat: Mucous membranes are moist.  Oropharynx non-erythematous. Neck: No stridor.  Cardiovascular: Normal rate, regular rhythm. Grossly normal heart sounds.  Good peripheral circulation. Respiratory: Normal respiratory effort.  No retractions. Lungs CTAB. Gastrointestinal: Soft moderate tenderness to palpation in the left lower quadrant, left mid abdomen. No distention. No abdominal bruits. No CVA tenderness. Genitourinary: deferred Musculoskeletal: No lower extremity tenderness nor edema.  No joint effusions. Neurologic:  Normal speech and language. No gross focal neurologic deficits are appreciated. Speech is normal. No gait instability. Skin:  Skin is warm, dry and intact. No rash noted. Psychiatric: Mood and affect are normal. Speech and behavior are normal.  ____________________________________________   LABS (all labs ordered are listed, but only abnormal results are displayed)  Labs Reviewed  COMPREHENSIVE METABOLIC PANEL - Abnormal; Notable for  the following:    Calcium 8.8 (*)    AST 14 (*)    ALT 13 (*)    Total Bilirubin 0.2 (*)    All other components within normal limits  CBC WITH DIFFERENTIAL/PLATELET - Abnormal; Notable for the following:    RDW 14.6 (*)    All other components within normal limits  URINALYSIS COMPLETEWITH MICROSCOPIC ([facility] ONLY) - Abnormal; Notable for the following:    Color, Urine YELLOW (*)    APPearance CLEAR (*)    Hgb urine dipstick 1+ (*)    All  other components within normal limits  LIPASE, BLOOD   ____________________________________________  EKG  ED ECG REPORT I, Joanne Gavel, the attending physician, personally viewed and interpreted this ECG.   Date: 04/06/2015  EKG Time: 11:17  Rate: 67  Rhythm: normal EKG, normal sinus rhythm  Axis: Normal  Intervals:none, normal conduction  ST&T Change: No acute ST segment change  ____________________________________________  RADIOLOGY  CT of the abdomen and pelvis IMPRESSION: 1. Mild fold thickening in the jejunum suggesting proximal enteritis. Otherwise, no significant abnormalities are observed.  ____________________________________________   PROCEDURES  Procedure(s) performed: None  Critical Care performed: No  ____________________________________________   INITIAL IMPRESSION / ASSESSMENT AND PLAN / ED COURSE  Pertinent labs & imaging results that were available during my care of the patient were reviewed by me and considered in my medical decision making (see chart for details).  Jeremy Bern Sr. is a 44 y.o. male with history of depression, anxiety, chronic pain, hyperlipidemia presents for evaluation of one month of worsening left lower quadrant pain. On exam, he is nontoxic appearing. Vital signs stable, he is afebrile. He does have moderate tenderness to palpation throughout the left lower quadrant and the left mid abdomen. Given his complaint of subjective fevers, persistent symptoms will obtain CT of the  abdomen and pelvis to rule out perforation or abscess given prior diagnosis of colitis. Labs reviewed and are unremarkable. Urinalysis pending.  ----------------------------------------- 4:05 PM on 04/06/2015 -----------------------------------------  Patient with significant improvement in pain. He is now sitting up in bed texting on his phone. UA negative for any evidence of an infection. CT of the abdomen and pelvis is notable only for mild enteritis. In the absence of leukocytosis, objective fever, vomiting or diarrhea, I do not think that he requires antibiotics at this time. We discussed return precautions, close PCP follow-up. He will see his urologist tomorrow. I discussed that I will send him home with #5 percocet tabs for assistance with breakthrough pain. I reviewed his entries in the [place] controlled substance database and he has not filled any narcotic prescriptions in some time. ____________________________________________   FINAL CLINICAL IMPRESSION(S) / ED DIAGNOSES  Final diagnoses:  LLQ abdominal pain  Enteritis      Joanne Gavel, MD 04/06/15 [phone]

## 2015-04-06 NOTE — ED Notes (Signed)
States has dr appt tomorrow, suppose to see urologist soon and pain mgmt appt end of this month for chronic pain

## 2015-04-06 NOTE — ED Notes (Signed)
LLQ pain.  Recently dx with colitis and states it feels the same

## 2015-04-06 NOTE — Addendum Note (Signed)
Addended by: Jeronimo Norma on: 04/06/2015 10:50 AM   Modules accepted: Orders, Medications

## 2015-04-06 NOTE — ED Notes (Signed)
Pt drinking second bottle of contrast left by xr.

## 2015-04-06 NOTE — Discharge Instructions (Signed)
You were seen in the emergency department for abdominal pain which is likely caused by a mild infection of your small bowel. This is called enteritis. You did not require any antibiotics today. Take Motrin as prescribed for pain. For continued pain despite taking Motrin, take oxycodone only as prescribed. See your doctors as soon as possible regarding today's visit. Return immediately to the emergency department if you develop fever greater than 100.4, recurrent vomiting, blood in vomit or stools, severe or worsening pain, chest pain, difficulty breathing, inability to have a bowel movement, or for any other concerns.

## 2015-04-06 NOTE — Progress Notes (Signed)
   Jeremy Delgado Follow-up Outpatient Visit  LADARIAN BONCZEK Sr. 1971/01/30  Date: 04/06/15   Subjective: Pt is a 44 yo male presented for follow up. He stated that he has been having abdominal pain and was Dx with Colitis about a month ago. He appeared to be in pain. He stated that he is not taking pain medications and does take antibiotics for his colitis. He is stable on Xanax XR is helping with his anxiety and Prozac is working with his depressive symptoms. He sleeps well at night. He spends time by doing minimal work due to his pain and he is limiting his activities. He stated that he is restricting his movements but is getting worse. He denied SI/HI or plans.   Filed Vitals:   04/06/15 1009  BP: 124/76  Pulse: 75  Temp: 97.8 F (36.6 C)    Mental Status Examination  Appearance: Moderately built male who appeared his stated age. He was walking with a limp. Alert: Yes Attention: fair  Cooperative: Yes Eye Contact: Fair Speech: Logical and goal-directed. Psychomotor Activity: Normal Memory/Concentration: Memory was normal for his age. He was able to concentrate  and answered most of the questions appropriately Oriented: person, place, time/date and situation Mood: Anxious, depressed Affect: Appropriate Thought Processes and Associations: Logical Fund of Knowledge: Fair Thought Content: Suicidal ideation none noted Insight: Fair Judgement: Fair    Review of Systems  Constitutional: Positive for weight loss and malaise/fatigue. Negative for fever and chills.  HENT: Negative for ear discharge and tinnitus.   Eyes: Negative for double vision.  Cardiovascular: Negative for palpitations and orthopnea.  Gastrointestinal: Positive for nausea, abdominal pain and constipation. Negative for vomiting and blood in stool.  Musculoskeletal: Positive for myalgias and back pain.  Skin: Negative for itching and rash.  Neurological: Positive for dizziness and headaches.   Psychiatric/Behavioral: Positive for depression. Negative for suicidal ideas and hallucinations. The patient is not nervous/anxious.     Diagnosis:   Major depressive disorder recurrent moderate Anxiety disorder NOS  Treatment Plan:   Discussed with patient what his medications and he will continue on the following medications Prozac 40 mg daily Xanax XR 0.5 mg by mouth twice a day Follow-up in 3 months Advised patient to call the clinic if he notices worsening of his symptoms or any adverse effects of the medication and he demonstratively understanding   More than 50% of the time spent in psychoeducation, counseling and coordination of care.    This note was generated in part or whole with voice recognition software. Voice regonition is usually quite accurate but there are transcription errors that can and very often do occur. I apologize for any typographical errors that were not detected and corrected.   Rainey Pines, MD

## 2015-04-20 ENCOUNTER — Ambulatory Visit (INDEPENDENT_AMBULATORY_CARE_PROVIDER_SITE_OTHER): Payer: PPO | Admitting: Urology

## 2015-04-20 ENCOUNTER — Encounter: Payer: Self-pay | Admitting: Urology

## 2015-04-20 VITALS — BP 109/71 | HR 73 | Ht 72.0 in | Wt 184.9 lb

## 2015-04-20 DIAGNOSIS — R109 Unspecified abdominal pain: Secondary | ICD-10-CM | POA: Diagnosis not present

## 2015-04-20 DIAGNOSIS — R1013 Epigastric pain: Secondary | ICD-10-CM | POA: Diagnosis not present

## 2015-04-20 LAB — MICROSCOPIC EXAMINATION: BACTERIA UA: NONE SEEN

## 2015-04-20 LAB — BLADDER SCAN AMB NON-IMAGING: SCAN RESULT: 14

## 2015-04-20 NOTE — Progress Notes (Signed)
I have been asked to see the patient by Dr. Lorelee Market, for evaluation and management of abdominal pain.  History of present illness: 44 year old male who presents today for questions and concerns regarding kidney stones. The patient states that he has had ongoing left lower quadrant and epigastric pain. He denies primary care provider and ultrasound was obtained of his kidneys. He was told that he had 2 kidney stones. He subsequently followed up in the emergency department because of progressive epigastric pain and underwent CT scan. This demonstrated no nephrolithiasis. He was told the emergency department that he did not have a stone, yet he was referred to urology for nephrolithiasis based on the ultrasound from the primary care provider. The patient's pain is intermittent. It is exacerbated immediately following eating. Pain last less than a minute each time. The patient takes omeprazole for acid reflux, he thinks this is helped him significantly, with his esophageal burning, It has not helped his pain.  The patient has associated nausea which tends to occur on a daily basis. He does not have a history of NSAID abuse.  Review of systems: A 12 point comprehensive review of systems was obtained and is negative unless otherwise stated in the history of present illness.  Patient Active Problem List   Diagnosis Date Noted  . Addiction to drug 04/06/2015  . Combinations of drug dependence excluding opioid type drug 04/06/2015  . Pain 04/06/2015  . Episodic paroxysmal anxiety disorder 04/06/2015  . Noninfectious gastroenteritis and colitis 04/06/2015  . H/O arthritis 02/20/2015  . H/O elevated lipids 02/20/2015  . Backache 02/20/2015  . H/O: hypothyroidism 02/20/2015  . Depression, major, recurrent, moderate 02/20/2015  . PHARYNGITIS 01/31/2009  . COUGH 01/31/2009  . GERD 12/29/2008  . TOBACCO ABUSE 07/15/2008  . HYPERCHOLESTEROLEMIA 03/11/2008  . ANXIETY 03/11/2008  . PAIN IN JOINT  PELVIC REGION AND THIGH 03/11/2008  . FLANK PAIN, LEFT 03/11/2008  . BACK PAIN 02/17/2007  . DEPRESSION 11/05/2003    Current Outpatient Prescriptions on File Prior to Visit  Medication Sig Dispense Refill  . ALPRAZolam (XANAX XR) 0.5 MG 24 hr tablet Take 1 tablet (0.5 mg total) by mouth 2 (two) times daily. 60 tablet 3  . FLUoxetine (PROZAC) 40 MG capsule Take 1 capsule (40 mg total) by mouth daily. 30 capsule 3  . gabapentin (NEURONTIN) 300 MG capsule 1 capsule 2 (two) times daily.   3  . ibuprofen (ADVIL,MOTRIN) 600 MG tablet Take 1 tablet (600 mg total) by mouth every 6 (six) hours as needed for moderate pain. 15 tablet 0  . levothyroxine (SYNTHROID, LEVOTHROID) 50 MCG tablet Take 50 mcg by mouth every morning.   0  . lisinopril (PRINIVIL,ZESTRIL) 10 MG tablet Take 10 mg by mouth daily.   1  . Melatonin 3 MG CAPS Take 1 capsule by mouth at bedtime.    Marland Kitchen omeprazole (PRILOSEC) 20 MG capsule Take 20 mg by mouth daily.    . pravastatin (PRAVACHOL) 40 MG tablet Take 40 mg by mouth every evening.    Marland Kitchen oxyCODONE-acetaminophen (ROXICET) 5-325 MG per tablet Take 1 tablet by mouth every 6 (six) hours as needed for moderate pain or severe pain. (Patient not taking: Reported on 04/20/2015) 5 tablet 0  . triamcinolone cream (KENALOG) 0.1 % TRIAMCINOLONE ACETONIDE, 0.1% (External Cream) - Historical Medication  daily (0.1 %) Active     No current facility-administered medications on file prior to visit.    Past Medical History  Diagnosis Date  . Sciatica   .  Chronic pain   . Hypertension   . Arthritis   . Anxiety   . Depression   . Colitis     self    Past Surgical History  Procedure Laterality Date  . Hemorrhoid surgery      History  Substance Use Topics  . Smoking status: Former Smoker    Quit date: 05/11/2012  . Smokeless tobacco: Never Used     Comment: pt currently uses a E-cig  . Alcohol Use: 0.0 oz/week    0 Standard drinks or equivalent per week    Family History   Problem Relation Age of Onset  . Depression Mother   . Hypertension Father   . Heart failure Father   . Diabetes Paternal Grandfather     PE: Filed Vitals:   04/20/15 1204  BP: 109/71  Pulse: 73  Height: 6' (1.829 m)  Weight: 83.87 kg (184 lb 14.4 oz)   Patient appears to be in no acute distress  patient is alert and oriented x3 Atraumatic normocephalic head No cervical or supraclavicular lymphadenopathy appreciated No increased work of breathing, no audible wheezes/rhonchi Regular sinus rhythm/rate Abdomen is soft, nontender, nondistended, no CVA or suprapubic tenderness Lower extremities are symmetric without appreciable edema Grossly neurologically intact No identifiable skin lesions  No results for input(s): WBC, HGB, HCT in the last 72 hours. No results for input(s): NA, K, CL, CO2, GLUCOSE, BUN, CREATININE, CALCIUM in the last 72 hours. No results for input(s): LABPT, INR in the last 72 hours. No results for input(s): LABURIN in the last 72 hours. No results found for this or any previous visit.  Imaging: I independently reviewed the patient's CT scan from the emergency department dated on 04/06/15 which demonstrates normal-appearing kidneys bilaterally with no evidence of nephrolithiasis. There is no hydronephrosis or obstructing ureteral stones. There is mention in the report of a thickened jejunum   Imp: The patient has abdominal pain of unclear etiology. The pain seems to be lower down then we might expect from reflux for which she is actually early treated for. His pain is postprandial mostly. He has associated nausea. I wonder if the patient has an ulcer in his foregut. I did reassure him as it relates to his kidneys, I reiterated what he had been told in the emergency Department which is that he has no kidney stones.  Recommendations:  I recommended that the patient follow-up with me on an as-needed basis. I did tell him I would try to refer him to  gastroenterology for further evaluation of his symptoms. Cc: Lorelee Market, MD  Louis Meckel W

## 2015-04-24 LAB — URINALYSIS, COMPLETE

## 2015-05-09 ENCOUNTER — Ambulatory Visit (INDEPENDENT_AMBULATORY_CARE_PROVIDER_SITE_OTHER): Payer: PPO | Admitting: Gastroenterology

## 2015-05-09 VITALS — BP 128/73 | HR 64 | Temp 98.3°F | Ht 72.0 in | Wt 190.0 lb

## 2015-05-09 DIAGNOSIS — R1012 Left upper quadrant pain: Secondary | ICD-10-CM

## 2015-05-09 DIAGNOSIS — R1032 Left lower quadrant pain: Secondary | ICD-10-CM

## 2015-05-09 NOTE — Progress Notes (Signed)
Gastroenterology Consultation  Referring Provider:     Lorelee Market, MD Primary Care Physician:  Lorelee Market, MD Primary Gastroenterologist:  Dr. Allen Norris     Reason for Consultation:     Left-sided abdominal pain        HPI:   Jeremy Delgado is a 44 y.o. y/o male referred for consultation & management of  Left-sided abdominal pain by Dr. Brunetta Genera, Sabino Gasser, MD.   This patient comes today with a report of pain in his left side. The patient states that it is a vertical pain that goes from under his left rib down to his pelvis. The patient has a long history back problems and muscular problems.The patient reports that the pain is worse when he eats and when he moves his bowels. The patient also reports the pain to be worse when he strains to urinate. There is no report of any unexplained weight loss. He also denies any black stools or bloody stools. There is no report of any nausea or vomiting associated with the abdominal pain.  Past Medical History  Diagnosis Date  . Sciatica   . Chronic pain   . Hypertension   . Arthritis   . Colitis     self  . GERD 12/29/2008    Qualifier: Diagnosis of  By: Hassell Done FNP, Tori Milks    . HYPERCHOLESTEROLEMIA 03/11/2008    Qualifier: Diagnosis of  By: Hassell Done FNP, Tori Milks    . ANXIETY 03/11/2008    Qualifier: Diagnosis of  By: Hassell Done FNP, Tori Milks     . TOBACCO ABUSE 07/15/2008    Qualifier: Diagnosis of  By: Hassell Done FNP, Tori Milks    . DEPRESSION 11/05/2003    Qualifier: Diagnosis of  By: Hassell Done FNP, Tori Milks    . PAIN IN JOINT PELVIC REGION AND THIGH 03/11/2008    Qualifier: Diagnosis of  By: Hassell Done FNP, Tori Milks    . BACK PAIN 02/17/2007    Qualifier: Diagnosis of  By: Silvio Pate MD, [facility]   . H/O arthritis 02/20/2015  . H/O: hypothyroidism 02/20/2015  . Addiction to drug 04/06/2015    Past Surgical History  Procedure Laterality Date  . Hemorrhoid surgery      Prior to Admission medications   Medication Sig Start Date End Date Taking?  Authorizing Provider  ALPRAZolam (XANAX XR) 0.5 MG 24 hr tablet Take 1 tablet (0.5 mg total) by mouth 2 (two) times daily. 04/06/15  Yes Rainey Pines, MD  FLUoxetine (PROZAC) 40 MG capsule Take 1 capsule (40 mg total) by mouth daily. 04/06/15  Yes Rainey Pines, MD  gabapentin (NEURONTIN) 300 MG capsule 1 capsule 2 (two) times daily.  03/08/15  Yes Historical Provider, MD  levothyroxine (SYNTHROID, LEVOTHROID) 50 MCG tablet Take 50 mcg by mouth every morning.  03/13/15  Yes Historical Provider, MD  lisinopril (PRINIVIL,ZESTRIL) 10 MG tablet Take 10 mg by mouth daily.  03/08/15  Yes Historical Provider, MD  Melatonin 3 MG CAPS Take 1 capsule by mouth at bedtime.   Yes Historical Provider, MD  meloxicam (MOBIC) 7.5 MG tablet  04/07/15  Yes Historical Provider, MD  omeprazole (PRILOSEC) 20 MG capsule Take 20 mg by mouth daily.   Yes Historical Provider, MD  oxyCODONE-acetaminophen (ROXICET) 5-325 MG per tablet Take 1 tablet by mouth every 6 (six) hours as needed for moderate pain or severe pain. 04/06/15  Yes Joanne Gavel, MD  pravastatin (PRAVACHOL) 40 MG tablet Take 40 mg by mouth every evening.   Yes Historical Provider, MD  triamcinolone cream (KENALOG) 0.1 % TRIAMCINOLONE ACETONIDE, 0.1% (External Cream) - Historical Medication  daily (0.1 %) Active   Yes Historical Provider, MD  ibuprofen (ADVIL,MOTRIN) 600 MG tablet Take 1 tablet (600 mg total) by mouth every 6 (six) hours as needed for moderate pain. Patient not taking: Reported on 05/09/2015 04/06/15   Joanne Gavel, MD    Family History  Problem Relation Age of Onset  . Depression Mother   . Hypertension Father   . Heart failure Father   . Diabetes Paternal Grandfather      History  Substance Use Topics  . Smoking status: Former Smoker    Quit date: 05/11/2012  . Smokeless tobacco: Never Used     Comment: pt currently uses a E-cig  . Alcohol Use: 0.0 oz/week    0 Standard drinks or equivalent per week    Allergies as of 05/09/2015 - Review  Complete 05/09/2015  Allergen Reaction Noted  . Bee pollen Anaphylaxis 01/04/2014  . Codeine Nausea Only 11/27/2007  . Prednisone Other (See Comments) 03/11/2008    Review of Systems:    All systems reviewed and negative except where noted in HPI.   Physical Exam:  BP 128/73 mmHg  Pulse 64  Temp(Src) 98.3 F (36.8 C) (Oral)  Ht 6' (1.829 m)  Wt 190 lb (86.183 kg)  BMI 25.76 kg/m2 No LMP for male patient. Psych:  Alert and cooperative. Normal mood and affect. General:   Alert,  Well-developed, well-nourished, pleasant and cooperative in NAD Head:  Normocephalic and atraumatic. Eyes:  Sclera clear, no icterus.   Conjunctiva pink. Ears:  Normal auditory acuity. Nose:  No deformity, discharge, or lesions. Mouth:  No deformity or lesions,oropharynx pink & moist. Neck:  Supple; no masses or thyromegaly. Lungs:  Respirations even and unlabored.  Clear throughout to auscultation.   No wheezes, crackles, or rhonchi. No acute distress. Heart:  Regular rate and rhythm; no murmurs, clicks, rubs, or gallops. Abdomen:  Normal bowel sounds.  No bruits.  Soft, non-tender and non-distended without masses, hepatosplenomegaly or hernias noted.  No guarding or rebound tenderness.  Positive Carnett sign.   Rectal:  Deferred.  Msk:  Symmetrical without gross deformities.  Good, equal movement & strength bilaterally. Pulses:  Normal pulses noted. Extremities:  No clubbing or edema.  No cyanosis. Neurologic:  Alert and oriented x3;  grossly normal neurologically. Skin:  Intact without significant lesions or rashes.  Multiple tattoos. No jaundice. Lymph Nodes:  No significant cervical adenopathy. Psych:  Alert and cooperative. Normal mood and affect.  Imaging Studies: No results found.  Assessment and Plan:   Jeremy Delgado is a 44 y.o. y/o male  who comes in today with a report of abdominal pain. On the physical exam the abdominal pain is reproducible with one finger palpation while flexing the  abdominal wall muscles. There is no sign of any G.I. cause of his abdominal symptoms. After explaining the findings with the patient he reports to agree with my assessment. The patient will use warm compresses to the area and treated as musculoskeletal strain.

## 2015-07-06 ENCOUNTER — Ambulatory Visit (INDEPENDENT_AMBULATORY_CARE_PROVIDER_SITE_OTHER): Payer: No Typology Code available for payment source | Admitting: Psychiatry

## 2015-07-06 ENCOUNTER — Encounter: Payer: Self-pay | Admitting: Psychiatry

## 2015-07-06 VITALS — BP 118/78 | HR 72 | Temp 97.5°F | Ht 72.0 in | Wt 189.2 lb

## 2015-07-06 DIAGNOSIS — F330 Major depressive disorder, recurrent, mild: Secondary | ICD-10-CM | POA: Diagnosis not present

## 2015-07-06 MED ORDER — FLUOXETINE HCL 40 MG PO CAPS: 40.0000 mg | ORAL_CAPSULE | Freq: Every day | ORAL | Status: DC

## 2015-07-06 NOTE — Progress Notes (Signed)
[facility] MD/PA/NP OP Progress Note  07/06/2015 10:10 AM Jeremy Delgado  MRN:  [id number]  Subjective:    Patient is a 44 year old male who presented for the follow-up appointment. He was last seen in April. He reported that he went for a rehabilitation program in [place] called "[facility] " for approximately a month. He just came back in the middle of August. He reported that he wanted to get off of the Xanax. He appeared calm and cooperative and reported that he is excited that he is now able to get his license back. He does not have any withdrawal symptoms at this time. He reported that the dose of his Prozac was titrated to 60 mg however he is currently feeling more anxious on the 60 mg of Prozac and wants to decrease the dose. He currently denied having any suicidal ideations or plans. He reported that he wants to go out to eat over the weekend but does not have a license at this time. His mother brings him for the appointments. He reported that he was also given Neurontin for his chronic back pain..  Patient is compliant with his medications.   Chief Complaint:  Chief Complaint    Follow-up; Medication Refill; Anxiety; Depression     Visit Diagnosis:     ICD-9-CM ICD-10-CM   1. MDD (major depressive disorder), recurrent episode, mild 296.31 F33.0     Past Medical History:  Past Medical History  Diagnosis Date  . Sciatica   . Chronic pain   . Hypertension   . Arthritis   . Colitis     self  . GERD 12/29/2008    Qualifier: Diagnosis of  By: Hassell Done FNP, Tori Milks    . HYPERCHOLESTEROLEMIA 03/11/2008    Qualifier: Diagnosis of  By: Hassell Done FNP, Tori Milks    . ANXIETY 03/11/2008    Qualifier: Diagnosis of  By: Hassell Done FNP, Tori Milks     . TOBACCO ABUSE 07/15/2008    Qualifier: Diagnosis of  By: Hassell Done FNP, Tori Milks    . DEPRESSION 11/05/2003    Qualifier: Diagnosis of  By: Hassell Done FNP, Tori Milks    . PAIN IN JOINT PELVIC REGION AND THIGH 03/11/2008    Qualifier: Diagnosis of  By: Hassell Done FNP,  Tori Milks    . BACK PAIN 02/17/2007    Qualifier: Diagnosis of  By: Silvio Pate MD, [facility]   . H/O arthritis 02/20/2015  . H/O: hypothyroidism 02/20/2015  . Addiction to drug 04/06/2015    Past Surgical History  Procedure Laterality Date  . Hemorrhoid surgery     Family History:  Family History  Problem Relation Age of Onset  . Depression Mother   . Hypertension Father   . Heart failure Father   . Diabetes Paternal Grandfather    Social History:  Social History   Social History  . Marital Status: Divorced    Spouse Name: N/A  . Number of Children: N/A  . Years of Education: N/A   Social History Main Topics  . Smoking status: Current Every Day Smoker -- 0.50 packs/day    Types: Cigarettes    Start date: 07/05/1990  . Smokeless tobacco: Never Used     Comment: pt currently uses a E-cig  . Alcohol Use: 0.0 oz/week    0 Standard drinks or equivalent, 0 Glasses of wine, 0 Cans of beer, 0 Shots of liquor per week     Comment: social   . Drug Use: 2.00 per week    Special: Marijuana  . Sexual Activity:  Yes    Birth Control/ Protection: Condom   Other Topics Concern  . None   Social History Narrative   Additional History:   Living by self. Son moved to [place].   Assessment:   Musculoskeletal: Strength & Muscle Tone: within normal limits Gait & Station: normal Patient leans: N/A  Psychiatric Specialty Exam: HPI  Review of Systems  Musculoskeletal: Positive for back pain.  Psychiatric/Behavioral: Positive for depression. The patient is nervous/anxious. The patient does not have insomnia.     Blood pressure 118/78, pulse 72, temperature 97.5 F (36.4 C), temperature source Tympanic, height 6' (1.829 m), weight 189 lb 3.2 oz (85.821 kg), SpO2 98 %.Body mass index is 25.65 kg/(m^2).  General Appearance: Casual  Eye Contact:  Fair  Speech:  Clear and Coherent  Volume:  Decreased  Mood:  Anxious  Affect:  Congruent  Thought Process:  Coherent  Orientation:  Full (Time,  Place, and Person)  Thought Content:  WDL  Suicidal Thoughts:  No  Homicidal Thoughts:  No  Memory:  Immediate;   Fair  Judgement:  Fair  Insight:  Fair  Psychomotor Activity:  Normal  Concentration:  Fair  Recall:  [employer] of Knowledge: Fair  Language: Fair  Akathisia:  No  Handed:  Right  AIMS (if indicated):    Assets:  Communication Skills Physical Health Social Support  ADL's:  Intact  Cognition: WNL  Sleep:     Is the patient at risk to self?  No. Has the patient been a risk to self in the past 6 months?  No. Has the patient been a risk to self within the distant past?  No. Is the patient a risk to others?  No. Has the patient been a risk to others in the past 6 months?  No. Has the patient been a risk to others within the distant past?  No.  Current Medications: Current Outpatient Prescriptions  Medication Sig Dispense Refill  . FLUoxetine (PROZAC) 40 MG capsule Take 1 capsule (40 mg total) by mouth daily. 30 capsule 3  . levothyroxine (SYNTHROID, LEVOTHROID) 50 MCG tablet Take 50 mcg by mouth every morning.   0  . lisinopril (PRINIVIL,ZESTRIL) 10 MG tablet Take 10 mg by mouth daily.   1  . Melatonin 3 MG CAPS Take 1 capsule by mouth at bedtime.    . meloxicam (MOBIC) 7.5 MG tablet   2  . omeprazole (PRILOSEC) 20 MG capsule Take 20 mg by mouth daily.    . pravastatin (PRAVACHOL) 40 MG tablet Take 40 mg by mouth every evening.    . triamcinolone cream (KENALOG) 0.1 % TRIAMCINOLONE ACETONIDE, 0.1% (External Cream) - Historical Medication  daily (0.1 %) Active    . ALPRAZolam (XANAX XR) 0.5 MG 24 hr tablet Take 1 tablet (0.5 mg total) by mouth 2 (two) times daily. (Patient not taking: Reported on 07/06/2015) 60 tablet 3  . gabapentin (NEURONTIN) 300 MG capsule 1 capsule 2 (two) times daily.   3  . ibuprofen (ADVIL,MOTRIN) 600 MG tablet Take 1 tablet (600 mg total) by mouth every 6 (six) hours as needed for moderate pain. (Patient not taking: Reported on 05/09/2015) 15  tablet 0  . oxyCODONE-acetaminophen (ROXICET) 5-325 MG per tablet Take 1 tablet by mouth every 6 (six) hours as needed for moderate pain or severe pain. (Patient not taking: Reported on 07/06/2015) 5 tablet 0   No current facility-administered medications for this visit.    Medical Decision Making:  Established Problem, Stable/Improving (  1), Review of Psycho-Social Stressors (1) and Review and summation of old records (2)  Treatment Plan Summary:Medication management  Discussed with him about decreasing the dose of Prozac to 40 mg daily to decrease the anxiety and nervousness  and he demonstrated understanding Medication refill for the next 3 months he will follow up in 3 months or earlier.   More than 50% of the time spent in psychoeducation, counseling and coordination of care.    This note was generated in part or whole with voice recognition software. Voice regonition is usually quite accurate but there are transcription errors that can and very often do occur. I apologize for any typographical errors that were not detected and corrected.    Rainey Pines 07/06/2015, 10:10 AM

## 2015-08-30 NOTE — Progress Notes (Signed)
Refill on xanax and the prozac was decreased in dosage

## 2015-09-27 ENCOUNTER — Encounter (HOSPITAL_COMMUNITY): Payer: Self-pay | Admitting: Emergency Medicine

## 2015-09-27 ENCOUNTER — Emergency Department (HOSPITAL_COMMUNITY)
Admission: EM | Admit: 2015-09-27 | Discharge: 2015-09-27 | Disposition: A | Discharge status: Home / Self Care | Payer: Medicare Other | Attending: Emergency Medicine | Admitting: Emergency Medicine

## 2015-09-27 ENCOUNTER — Emergency Department (HOSPITAL_COMMUNITY): Payer: Medicare Other

## 2015-09-27 DIAGNOSIS — G8929 Other chronic pain: Secondary | ICD-10-CM | POA: Insufficient documentation

## 2015-09-27 DIAGNOSIS — Z79899 Other long term (current) drug therapy: Secondary | ICD-10-CM | POA: Diagnosis not present

## 2015-09-27 DIAGNOSIS — Z87448 Personal history of other diseases of urinary system: Secondary | ICD-10-CM | POA: Diagnosis not present

## 2015-09-27 DIAGNOSIS — K219 Gastro-esophageal reflux disease without esophagitis: Secondary | ICD-10-CM | POA: Diagnosis not present

## 2015-09-27 DIAGNOSIS — R109 Unspecified abdominal pain: Secondary | ICD-10-CM | POA: Insufficient documentation

## 2015-09-27 DIAGNOSIS — M1990 Unspecified osteoarthritis, unspecified site: Secondary | ICD-10-CM | POA: Insufficient documentation

## 2015-09-27 DIAGNOSIS — E039 Hypothyroidism, unspecified: Secondary | ICD-10-CM | POA: Diagnosis not present

## 2015-09-27 DIAGNOSIS — E7800 Pure hypercholesterolemia, unspecified: Secondary | ICD-10-CM | POA: Insufficient documentation

## 2015-09-27 DIAGNOSIS — R319 Hematuria, unspecified: Secondary | ICD-10-CM | POA: Diagnosis not present

## 2015-09-27 DIAGNOSIS — I10 Essential (primary) hypertension: Secondary | ICD-10-CM | POA: Insufficient documentation

## 2015-09-27 DIAGNOSIS — F329 Major depressive disorder, single episode, unspecified: Secondary | ICD-10-CM | POA: Insufficient documentation

## 2015-09-27 DIAGNOSIS — F419 Anxiety disorder, unspecified: Secondary | ICD-10-CM | POA: Diagnosis not present

## 2015-09-27 HISTORY — DX: Disorder of kidney and ureter, unspecified: N28.9

## 2015-09-27 LAB — CBC
HEMATOCRIT: 42.6 % (ref 39.0–52.0)
HEMOGLOBIN: 14.5 g/dL (ref 13.0–17.0)
MCH: 30.7 pg (ref 26.0–34.0)
MCHC: 34 g/dL (ref 30.0–36.0)
MCV: 90.1 fL (ref 78.0–100.0)
PLATELETS: 344 10*3/uL (ref 150–400)
RBC: 4.73 MIL/uL (ref 4.22–5.81)
RDW: 13.3 % (ref 11.5–15.5)
WBC: 5.8 10*3/uL (ref 4.0–10.5)

## 2015-09-27 LAB — URINALYSIS, ROUTINE W REFLEX MICROSCOPIC
Bilirubin Urine: NEGATIVE
GLUCOSE, UA: NEGATIVE mg/dL
Hgb urine dipstick: NEGATIVE
Ketones, ur: NEGATIVE mg/dL
LEUKOCYTES UA: NEGATIVE
NITRITE: NEGATIVE
PH: 6.5 (ref 5.0–8.0)
Protein, ur: NEGATIVE mg/dL
Specific Gravity, Urine: 1.009 (ref 1.005–1.030)

## 2015-09-27 LAB — BASIC METABOLIC PANEL
ANION GAP: 8 (ref 5–15)
BUN: 16 mg/dL (ref 6–20)
CALCIUM: 8.8 mg/dL — AB (ref 8.9–10.3)
CHLORIDE: 105 mmol/L (ref 101–111)
CO2: 22 mmol/L (ref 22–32)
Creatinine, Ser: 0.61 mg/dL (ref 0.61–1.24)
GFR calc Af Amer: >60 mL/min (ref >60)
GFR calc non Af Amer: >60 mL/min (ref >60)
Glucose, Bld: 114 mg/dL — ABNORMAL HIGH (ref 65–99)
Potassium: 4.1 mmol/L (ref 3.5–5.1)
Sodium: 135 mmol/L (ref 135–145)

## 2015-09-27 MED ORDER — KETOROLAC TROMETHAMINE 60 MG/2ML IM SOLN
30.0000 mg | Freq: Once | INTRAMUSCULAR | Status: AC
  Administered 2015-09-27: 30 mg via INTRAMUSCULAR
  Filled 2015-09-27: qty 2

## 2015-09-27 MED ORDER — NAPROXEN 500 MG PO TABS: 500.0000 mg | ORAL_TABLET | Freq: Two times a day (BID) | ORAL | Status: DC

## 2015-09-27 NOTE — ED Notes (Signed)
Per pt, states he noticed blood in urine about 4 hours ago-left flank pain and left groin pain-states history of stones and/or cyst on kidney

## 2015-09-27 NOTE — ED Notes (Signed)
Pt states he has LLQ pain, gave pt urinal.

## 2015-09-27 NOTE — ED Notes (Signed)
PA at bedside.

## 2015-09-27 NOTE — Discharge Instructions (Signed)
Flank Pain °Flank pain refers to pain that is located on the side of the body between the upper abdomen and the back. The pain may occur over a short period of time (acute) or may be long-term or reoccurring (chronic). It may be mild or severe. Flank pain can be caused by many things. °CAUSES  °Some of the more common causes of flank pain include: °· Muscle strains.   °· Muscle spasms.   °· A disease of your spine (vertebral disk disease).   °· A lung infection (pneumonia).   °· Fluid around your lungs (pulmonary edema).   °· A kidney infection.   °· Kidney stones.   °· A very painful skin rash caused by the chickenpox virus (shingles).   °· Gallbladder disease.   °HOME CARE INSTRUCTIONS  °Home care will depend on the cause of your pain. In general, °· Rest as directed by your caregiver. °· Drink enough fluids to keep your urine clear or pale yellow. °· Only take over-the-counter or prescription medicines as directed by your caregiver. Some medicines may help relieve the pain. °· Tell your caregiver about any changes in your pain. °· Follow up with your caregiver as directed. °SEEK IMMEDIATE MEDICAL CARE IF:  °· Your pain is not controlled with medicine.   °· You have new or worsening symptoms. °· Your pain increases.   °· You have abdominal pain.   °· You have shortness of breath.   °· You have persistent nausea or vomiting.   °· You have swelling in your abdomen.   °· You feel faint or pass out.   °· You have blood in your urine. °· You have a fever or persistent symptoms for more than 2-3 days. °· You have a fever and your symptoms suddenly get worse. °MAKE SURE YOU:  °· Understand these instructions. °· Will watch your condition. °· Will get help right away if you are not doing well or get worse. °  °This information is not intended to replace advice given to you by your health care provider. Make sure you discuss any questions you have with your health care provider. °  °Document Released: 12/12/2005 Document  Revised: 07/15/2012 Document Reviewed: 06/04/2012 °Elsevier Interactive Patient Education ©2016 Elsevier Inc[employer] ° °

## 2015-09-27 NOTE — ED Notes (Signed)
Pt made aware of urine sample, urinal at bedside

## 2015-09-27 NOTE — ED Provider Notes (Signed)
CSN: [id number]     Arrival date & time 09/27/15  1038 History   First MD Initiated Contact with Patient 09/27/15 1048     Chief Complaint  Patient presents with  . Flank Pain  . Hematuria     (Consider location/radiation/quality/duration/timing/severity/associated sxs/prior Treatment) HPI   44 year old male with history of chronic pain, sciatica, arthritis, colitis presents c/o L flank pain.  Patient report yesterday when he urinates he noticed moderate amount of blood in his urine. Symptom has been persistent. He also endorsed having left flank pain which she describes a sharp stabbing sensation, radiates to his left testicle. Nothing seems to make the pain better or worse. No specific treatment tried. No associated fever, chills, lightheadedness, chest pain, shortness of breath, penile discharge, or leg pain, or rash. He denies any recent injury. Pain is currently moderate in intensity. Patient states he was told that he had kidney stone last year by Dr. but another doctor said he may have a cyst in his left kidney. He does not have a urologist.    Past Medical History  Diagnosis Date  . Sciatica   . Chronic pain   . Hypertension   . Arthritis   . Colitis     self  . GERD 12/29/2008    Qualifier: Diagnosis of  By: Hassell Done FNP, Tori Milks    . HYPERCHOLESTEROLEMIA 03/11/2008    Qualifier: Diagnosis of  By: Hassell Done FNP, Tori Milks    . ANXIETY 03/11/2008    Qualifier: Diagnosis of  By: Hassell Done FNP, Tori Milks     . TOBACCO ABUSE 07/15/2008    Qualifier: Diagnosis of  By: Hassell Done FNP, Tori Milks    . DEPRESSION 11/05/2003    Qualifier: Diagnosis of  By: Hassell Done FNP, Tori Milks    . PAIN IN JOINT PELVIC REGION AND THIGH 03/11/2008    Qualifier: Diagnosis of  By: Hassell Done FNP, Tori Milks    . BACK PAIN 02/17/2007    Qualifier: Diagnosis of  By: Silvio Pate MD, [facility]   . H/O arthritis 02/20/2015  . H/O: hypothyroidism 02/20/2015  . Addiction to drug ([place]) 04/06/2015  . Renal disorder    Past Surgical History   Procedure Laterality Date  . Hemorrhoid surgery     Family History  Problem Relation Age of Onset  . Depression Mother   . Hypertension Father   . Heart failure Father   . Diabetes Paternal Grandfather    Social History  Substance Use Topics  . Smoking status: Current Every Day Smoker -- 0.50 packs/day    Types: Cigarettes    Start date: 07/05/1990  . Smokeless tobacco: Never Used     Comment: pt currently uses a E-cig  . Alcohol Use: 0.0 oz/week    0 Glasses of wine, 0 Cans of beer, 0 Shots of liquor, 0 Standard drinks or equivalent per week     Comment: social     Review of Systems  Constitutional: Negative for fever.  Genitourinary: Positive for flank pain.  Neurological: Negative for numbness and headaches.      Allergies  Bee pollen; Codeine; and Prednisone  Home Medications   Prior to Admission medications   Medication Sig Start Date End Date Taking? Authorizing Provider  FLUoxetine (PROZAC) 40 MG capsule Take 1 capsule (40 mg total) by mouth daily. 07/06/15   Rainey Pines, MD  gabapentin (NEURONTIN) 300 MG capsule 1 capsule 2 (two) times daily.  03/08/15   Historical Provider, MD  ibuprofen (ADVIL,MOTRIN) 600 MG tablet Take 1 tablet (600 mg  total) by mouth every 6 (six) hours as needed for moderate pain. Patient not taking: Reported on 05/09/2015 04/06/15   Joanne Gavel, MD  levothyroxine (SYNTHROID, LEVOTHROID) 50 MCG tablet Take 50 mcg by mouth every morning.  03/13/15   Historical Provider, MD  lisinopril (PRINIVIL,ZESTRIL) 10 MG tablet Take 10 mg by mouth daily.  03/08/15   Historical Provider, MD  Melatonin 3 MG CAPS Take 1 capsule by mouth at bedtime.    Historical Provider, MD  meloxicam (MOBIC) 7.5 MG tablet  04/07/15   Historical Provider, MD  omeprazole (PRILOSEC) 20 MG capsule Take 20 mg by mouth daily.    Historical Provider, MD  pravastatin (PRAVACHOL) 40 MG tablet Take 40 mg by mouth every evening.    Historical Provider, MD  triamcinolone cream (KENALOG) 0.1 %  TRIAMCINOLONE ACETONIDE, 0.1% (External Cream) - Historical Medication  daily (0.1 %) Active    Historical Provider, MD   There were no vitals taken for this visit. Physical Exam  Constitutional: He appears well-developed and well-nourished. No distress.  HENT:  Head: Atraumatic.  Eyes: Conjunctivae are normal.  Neck: Neck supple.  Cardiovascular: Normal rate and regular rhythm.   Pulmonary/Chest: Effort normal and breath sounds normal.  Abdominal: Soft. There is no tenderness.  Genitourinary: Penis normal. No penile tenderness.  Left CVA tenderness on percussion. No overlying skin changes.  Musculoskeletal: He exhibits tenderness.  Neurological: He is alert.  Skin: No rash noted.  Psychiatric: He has a normal mood and affect.  Nursing note and vitals reviewed.   ED Course  Procedures (including critical care time)  Patient here with left flank pain and hematuria. Patient mentioned he had history of kidney stones before. I have reveals patient's prior ER visits in which he has had numerous CT scan of his abdomen and pelvis with no evidence of kidney stones in the past. I have reviewed [facility] database, no recent narcotic prescription.    EMERGENCY DEPARTMENT US RENAL EXAM  "Study: Limited Retroperitoneal Ultrasound of Kidneys"  INDICATIONS: Flank pain  Long and short axis of both kidneys were obtained.   PERFORMED BY: Myself  IMAGES ARCHIVED?: Yes  LIMITATIONS: None  VIEWS USED: Long axis and Short axis   INTERPRETATION: No Hydronephrosis, No Renal cyst, No Kidney stone   CPT Code: [phone] (limited retroperitoneal)   11:38 AM Bedside renal ultrasound performed by me show no obvious evidence of hydronephrosis cyst or kidney stone however patient will have a formal ultrasound to evaluate his left flank pain.  1:04 PM A formal renal ultrasound showing a probable 4 mm nonobstructing left renal stone versus small cortical calcification. No evidence of hydronephrosis in  the left kidney appears otherwise normal. At this time patient's pain control after taking Toradol. I discussed the finding with patient and recommend outpatient follow-up with urologist as needed. Return precaution discussed. Otherwise patient stable for discharge.   Labs Review Labs Reviewed  BASIC METABOLIC PANEL - Abnormal; Notable for the following:    Glucose, Bld 114 (*)    Calcium 8.8 (*)    All other components within normal limits  URINALYSIS, ROUTINE W REFLEX MICROSCOPIC (NOT AT [facility])  CBC    Imaging Review US Renal  09/27/2015  CLINICAL DATA:  Left flank pain. EXAM: RENAL / URINARY TRACT ULTRASOUND COMPLETE COMPARISON:  None. FINDINGS: Right Kidney: Length: 12.2 cm. Echogenicity within normal limits. No mass or hydronephrosis visualized. Left Kidney: Length: 12.8 cm. Left renal cortex thickness and overall echogenicity is within normal limits.  No suspicious solid or cystic mass. 4 mm echogenic focus within the left midpole region is suspicious for small nonobstructing stone or cortical calcification. No hydronephrosis. Bladder: Appears normal for degree of bladder distention. Both distal ureters are shown to be patent at the level of bladder (bilateral ureteral jets visualized). IMPRESSION: 1. Probable 4 mm nonobstructing left renal stone versus small cortical calcification. No associated hydronephrosis. Left kidney appears otherwise normal. 2. Right kidney appears normal. 3. Bladder appears normal. Electronically Signed   By: Franki Cabot M.D.   On: 09/27/2015 12:26   I have personally reviewed and evaluated these images and lab results as part of my medical decision-making.   EKG Interpretation None      MDM   Final diagnoses:  Left flank pain    BP 141/80 mmHg  Pulse 59  Temp(Src) 98.3 F (36.8 C) (Oral)  Resp 18  SpO2 99%     Domenic Moras, PA-C 09/27/15 1305  Lacretia Leigh, MD 09/28/15 [phone]

## 2015-09-27 NOTE — ED Notes (Signed)
Ultrasound at bedside

## 2016-02-22 ENCOUNTER — Emergency Department: Payer: PPO

## 2016-02-22 ENCOUNTER — Encounter: Payer: Self-pay | Admitting: Emergency Medicine

## 2016-02-22 ENCOUNTER — Emergency Department
Admission: EM | Admit: 2016-02-22 | Discharge: 2016-02-22 | Disposition: A | Discharge status: Other Acute Inpatient Hospital | Payer: PPO | Attending: Emergency Medicine | Admitting: Emergency Medicine

## 2016-02-22 DIAGNOSIS — M659 Synovitis and tenosynovitis, unspecified: Secondary | ICD-10-CM | POA: Insufficient documentation

## 2016-02-22 DIAGNOSIS — Y999 Unspecified external cause status: Secondary | ICD-10-CM | POA: Diagnosis not present

## 2016-02-22 DIAGNOSIS — Z791 Long term (current) use of non-steroidal anti-inflammatories (NSAID): Secondary | ICD-10-CM | POA: Diagnosis not present

## 2016-02-22 DIAGNOSIS — F1290 Cannabis use, unspecified, uncomplicated: Secondary | ICD-10-CM | POA: Diagnosis not present

## 2016-02-22 DIAGNOSIS — B999 Unspecified infectious disease: Secondary | ICD-10-CM | POA: Diagnosis not present

## 2016-02-22 DIAGNOSIS — Y33XXXA Other specified events, undetermined intent, initial encounter: Secondary | ICD-10-CM | POA: Diagnosis not present

## 2016-02-22 DIAGNOSIS — S61011A Laceration without foreign body of right thumb without damage to nail, initial encounter: Secondary | ICD-10-CM | POA: Insufficient documentation

## 2016-02-22 DIAGNOSIS — M7989 Other specified soft tissue disorders: Secondary | ICD-10-CM | POA: Diagnosis not present

## 2016-02-22 DIAGNOSIS — M79641 Pain in right hand: Secondary | ICD-10-CM | POA: Diagnosis not present

## 2016-02-22 DIAGNOSIS — S61001A Unspecified open wound of right thumb without damage to nail, initial encounter: Secondary | ICD-10-CM | POA: Diagnosis not present

## 2016-02-22 DIAGNOSIS — Y929 Unspecified place or not applicable: Secondary | ICD-10-CM | POA: Diagnosis not present

## 2016-02-22 DIAGNOSIS — M6518 Other infective (teno)synovitis, other site: Secondary | ICD-10-CM | POA: Diagnosis not present

## 2016-02-22 DIAGNOSIS — F329 Major depressive disorder, single episode, unspecified: Secondary | ICD-10-CM | POA: Insufficient documentation

## 2016-02-22 DIAGNOSIS — I10 Essential (primary) hypertension: Secondary | ICD-10-CM | POA: Insufficient documentation

## 2016-02-22 DIAGNOSIS — F17210 Nicotine dependence, cigarettes, uncomplicated: Secondary | ICD-10-CM | POA: Insufficient documentation

## 2016-02-22 DIAGNOSIS — M1990 Unspecified osteoarthritis, unspecified site: Secondary | ICD-10-CM | POA: Diagnosis not present

## 2016-02-22 DIAGNOSIS — F1120 Opioid dependence, uncomplicated: Secondary | ICD-10-CM | POA: Insufficient documentation

## 2016-02-22 DIAGNOSIS — S61111A Laceration without foreign body of right thumb with damage to nail, initial encounter: Secondary | ICD-10-CM | POA: Diagnosis not present

## 2016-02-22 DIAGNOSIS — W25XXXA Contact with sharp glass, initial encounter: Secondary | ICD-10-CM | POA: Diagnosis not present

## 2016-02-22 DIAGNOSIS — E039 Hypothyroidism, unspecified: Secondary | ICD-10-CM | POA: Diagnosis not present

## 2016-02-22 DIAGNOSIS — Z79899 Other long term (current) drug therapy: Secondary | ICD-10-CM | POA: Diagnosis not present

## 2016-02-22 DIAGNOSIS — W208XXA Other cause of strike by thrown, projected or falling object, initial encounter: Secondary | ICD-10-CM | POA: Diagnosis not present

## 2016-02-22 DIAGNOSIS — M6510 Other infective (teno)synovitis, unspecified site: Secondary | ICD-10-CM

## 2016-02-22 DIAGNOSIS — Y9389 Activity, other specified: Secondary | ICD-10-CM | POA: Insufficient documentation

## 2016-02-22 LAB — BASIC METABOLIC PANEL
Anion gap: 6 (ref 5–15)
BUN: 15 mg/dL (ref 6–20)
CALCIUM: 9.1 mg/dL (ref 8.9–10.3)
CO2: 25 mmol/L (ref 22–32)
CREATININE: 0.75 mg/dL (ref 0.61–1.24)
Chloride: 106 mmol/L (ref 101–111)
GFR calc Af Amer: >60 mL/min (ref >60)
GFR calc non Af Amer: >60 mL/min (ref >60)
GLUCOSE: 92 mg/dL (ref 65–99)
Potassium: 4.2 mmol/L (ref 3.5–5.1)
Sodium: 137 mmol/L (ref 135–145)

## 2016-02-22 LAB — CBC WITH DIFFERENTIAL/PLATELET
BASOS PCT: 0 %
Basophils Absolute: 0 10*3/uL (ref 0–0.1)
EOS ABS: 0.1 10*3/uL (ref 0–0.7)
Eosinophils Relative: 2 %
HEMATOCRIT: 45.2 % (ref 40.0–52.0)
Hemoglobin: 15.2 g/dL (ref 13.0–18.0)
Lymphocytes Relative: 22 %
Lymphs Abs: 1.7 10*3/uL (ref 1.0–3.6)
MCH: 30.3 pg (ref 26.0–34.0)
MCHC: 33.5 g/dL (ref 32.0–36.0)
MCV: 90.3 fL (ref 80.0–100.0)
MONO ABS: 0.8 10*3/uL (ref 0.2–1.0)
MONOS PCT: 10 %
Neutro Abs: 4.9 10*3/uL (ref 1.4–6.5)
Neutrophils Relative %: 66 %
Platelets: 283 10*3/uL (ref 150–440)
RBC: 5.01 MIL/uL (ref 4.40–5.90)
RDW: 13.9 % (ref 11.5–14.5)
WBC: 7.5 10*3/uL (ref 3.8–10.6)

## 2016-02-22 MED ORDER — DEXTROSE 5 % IV SOLN
1.0000 g | Freq: Once | INTRAVENOUS | Status: AC
  Administered 2016-02-22: 1 g via INTRAVENOUS
  Filled 2016-02-22: qty 10

## 2016-02-22 MED ORDER — CEPHALEXIN 500 MG PO CAPS: 500.0000 mg | ORAL_CAPSULE | Freq: Once | ORAL | Status: DC

## 2016-02-22 MED ORDER — NAPROXEN 500 MG PO TBEC: 500.0000 mg | DELAYED_RELEASE_TABLET | Freq: Two times a day (BID) | ORAL | Status: DC

## 2016-02-22 MED ORDER — DIPHENHYDRAMINE HCL 25 MG PO CAPS
25.0000 mg | ORAL_CAPSULE | Freq: Once | ORAL | Status: AC
Start: 2016-02-22 — End: 2016-02-22
  Administered 2016-02-22: 25 mg via ORAL
  Filled 2016-02-22: qty 1

## 2016-02-22 MED ORDER — OXYCODONE-ACETAMINOPHEN 5-325 MG PO TABS
1.0000 | ORAL_TABLET | Freq: Once | ORAL | Status: AC
  Administered 2016-02-22: 1 via ORAL
  Filled 2016-02-22: qty 1

## 2016-02-22 MED ORDER — CEPHALEXIN 500 MG PO CAPS: 500.0000 mg | ORAL_CAPSULE | Freq: Three times a day (TID) | ORAL | Status: DC

## 2016-02-22 NOTE — ED Notes (Signed)
Cut thumb 6 days ago on glass and taped it down. Swelling started today after a drawer fell onto it last night

## 2016-02-22 NOTE — ED Provider Notes (Signed)
[facility] Emergency Department Provider Note ____________________________________________  Time seen: 2:45PM  I have reviewed the triage vital signs and the nursing notes.  HISTORY  Chief Complaint  Laceration and Wound Infection   HPI Jeremy Delgado is a 45 y.o. male, NAD, presents to the emergency room with 6 day history of a laceration to the right (dominant) hand. Patient states that 6 days ago he was moving a old tube tv when he accidentally struck the back of his thumb on a piece of broken glass where he received a deep laceration. He states he cleaned it with hydrogen peroxide, put the skin flap back in place and "taped it up". He does mention that he could see something "shiny" in the wound that he thought may be his bone. He has decreased ROM to the thumb and admittedly self-limited use of the thumb. Last night a dresser drawer fell on the thumb and re-opened the flap causing severe pain. He took some ibuprofen which helped slightly but didn't last long enough. He states that the pain radiates up the thumb and to the wrist. He has noticed some increased swelling in the area of the laceration as well as increased redness. Denies fever, chills, sweats, or other injury. Tetanus up to date according to patient, states it was five years ago.  Past Medical History  Diagnosis Date  . Sciatica   . Chronic pain   . Hypertension   . Arthritis   . Colitis     self  . GERD 12/29/2008    Qualifier: Diagnosis of  By: Hassell Done FNP, Tori Milks    . HYPERCHOLESTEROLEMIA 03/11/2008    Qualifier: Diagnosis of  By: Hassell Done FNP, Tori Milks    . ANXIETY 03/11/2008    Qualifier: Diagnosis of  By: Hassell Done FNP, Tori Milks     . TOBACCO ABUSE 07/15/2008    Qualifier: Diagnosis of  By: Hassell Done FNP, Tori Milks    . DEPRESSION 11/05/2003    Qualifier: Diagnosis of  By: Hassell Done FNP, Tori Milks    . PAIN IN JOINT PELVIC REGION AND THIGH 03/11/2008    Qualifier: Diagnosis of  By: Hassell Done FNP, Tori Milks     . BACK PAIN 02/17/2007    Qualifier: Diagnosis of  By: Silvio Pate MD, [facility]   . H/O arthritis 02/20/2015  . H/O: hypothyroidism 02/20/2015  . Addiction to drug ([place]) 04/06/2015  . Renal disorder     Patient Active Problem List   Diagnosis Date Noted  . Addiction to drug ([place]) 04/06/2015  . Combinations of drug dependence excluding opioid type drug ([place]) 04/06/2015  . Pain 04/06/2015  . Episodic paroxysmal anxiety disorder 04/06/2015  . Noninfectious gastroenteritis and colitis 04/06/2015  . H/O arthritis 02/20/2015  . H/O elevated lipids 02/20/2015  . Backache 02/20/2015  . H/O: hypothyroidism 02/20/2015  . Depression, major, recurrent, moderate ([place]) 02/20/2015  . PHARYNGITIS 01/31/2009  . COUGH 01/31/2009  . GERD 12/29/2008  . TOBACCO ABUSE 07/15/2008  . HYPERCHOLESTEROLEMIA 03/11/2008  . ANXIETY 03/11/2008  . PAIN IN JOINT PELVIC REGION AND THIGH 03/11/2008  . FLANK PAIN, LEFT 03/11/2008  . BACK PAIN 02/17/2007  . DEPRESSION 11/05/2003    Past Surgical History  Procedure Laterality Date  . Hemorrhoid surgery      Current Outpatient Rx  Name  Route  Sig  Dispense  Refill  . cephALEXin (KEFLEX) 500 MG capsule   Oral   Take 1 capsule (500 mg total) by mouth 3 (three) times daily.   21 capsule  0   . FLUoxetine (PROZAC) 40 MG capsule   Oral   Take 1 capsule (40 mg total) by mouth daily.   30 capsule   3   . gabapentin (NEURONTIN) 300 MG capsule   Oral   Take 1 capsule by mouth 2 (two) times daily as needed (pain).       3   . ibuprofen (ADVIL,MOTRIN) 600 MG tablet   Oral   Take 1 tablet (600 mg total) by mouth every 6 (six) hours as needed for moderate pain. Patient not taking: Reported on 05/09/2015   15 tablet   0   . levothyroxine (SYNTHROID, LEVOTHROID) 50 MCG tablet   Oral   Take 50 mcg by mouth every morning.       0   . lisinopril (PRINIVIL,ZESTRIL) 10 MG tablet   Oral   Take 10 mg by mouth daily.       1   . Melatonin 3 MG CAPS    Oral   Take 1 capsule by mouth at bedtime as needed (sleep).          . meloxicam (MOBIC) 7.5 MG tablet   Oral   Take 7.5 mg by mouth daily.       2   . naproxen (EC NAPROSYN) 500 MG EC tablet   Oral   Take 1 tablet (500 mg total) by mouth 2 (two) times daily with a meal.   30 tablet   0   . omeprazole (PRILOSEC) 20 MG capsule   Oral   Take 20 mg by mouth daily.         . pravastatin (PRAVACHOL) 40 MG tablet   Oral   Take 40 mg by mouth every evening.          Allergies Bee pollen; Codeine; and Prednisone  Family History  Problem Relation Age of Onset  . Depression Mother   . Hypertension Father   . Heart failure Father   . Diabetes Paternal Grandfather     Social History Social History  Substance Use Topics  . Smoking status: Current Every Day Smoker -- 0.50 packs/day    Types: Cigarettes    Start date: 07/05/1990  . Smokeless tobacco: Never Used     Comment: pt currently uses a E-cig  . Alcohol Use: 0.0 oz/week    0 Glasses of wine, 0 Cans of beer, 0 Shots of liquor, 0 Standard drinks or equivalent per week     Comment: social     Review of Systems  Constitutional: Negative for fever. Cardiovascular: Negative for chest pain. Respiratory: Negative for shortness of breath. Gastrointestinal: Negative for abdominal pain, vomiting and diarrhea. Genitourinary: Negative for dysuria. Musculoskeletal: Positive for right thumb and wrist pain and swelling. Negative for back pain. Skin: U-shaped laceration on dorsal aspect of right thumb. Negative for rash. Neurological: Negative for headaches, focal weakness or numbness. ____________________________________________  PHYSICAL EXAM:  VITAL SIGNS: ED Triage Vitals  Enc Vitals Group     BP 02/22/16 1352 138/88 mmHg     Pulse Rate 02/22/16 1352 72     Resp 02/22/16 1352 14     Temp 02/22/16 1352 98.3 F (36.8 C)     Temp Source 02/22/16 1352 Oral     SpO2 02/22/16 1352 99 %     Weight 02/22/16 1352 185  lb (83.915 kg)     Height 02/22/16 1352 6' (1.829 m)     Head Cir --      Peak Flow --  Pain Score 02/22/16 1352 6     Pain Loc --      Pain Edu? --      Excl. in [place]? --     Constitutional: Alert and oriented. Well appearing and in no distress. Head: Normocephalic and atraumatic. Cardiovascular: Distal pulses normal. Cap refill less than 3secs Musculoskeletal: Limited ROM and TTP in right thumb at the MCP. Unable to completely flex or extend thumb, without pain along the proximal extensor tendon sheath.  TTP up to mid-forearm.  Nontender with normal range of motion in all other extremities.  Neurologic:  Normal gross sensation, normal intrinsic and opposition testing.  Normal speech and language. No gross focal neurologic deficits are appreciated. Skin:  U-shaped laceration with skin flap with signs of recent dehiscence to the dorsal aspect of the right thumb at the MCP. Obvious serous drainage from wound. Edema present on dorsal aspect of thumb and hand. Otherwise, skin is warm, dry and intact. No rash noted. Psychiatric: Mood and affect are normal. Patient exhibits appropriate insight and judgment. ________________________________________________________ LABS  Labs Reviewed  WOUND CULTURE  BASIC METABOLIC PANEL  CBC WITH DIFFERENTIAL/PLATELET  ____________________________________________ Radiology  EXAM: RIGHT THUMB 2+V  COMPARISON: None.  FINDINGS: No fracture or dislocation of the first digit. No radiodense foreign body.  IMPRESSION: No fracture or foreign body. ____________________________________________  PROCEDURES  Rocephin 1g IV PO Roxicet 5-325mg  PO Benadryl 25mg  ____________________________________________  INITIAL IMPRESSION / ASSESSMENT AND PLAN / ED COURSE  Question of infectious tenosynovitis s/p laceration to dorsal aspect of right thumb MCP. After consultation Dr. Joni Fears he agreed with treatment plan to transfer patient to [facility] for further  evaluation and management by ortho hand specialist. Patient agrees with the treatment plan and transfer.   ----------------------------------------- 6:20 PM on 02/22/2016 ----------------------------------------- [place] agrees to ED-ED transfer of patient. Dr. Gevena Barre (Ortho-Hand) will accept patient. ____________________________________________   FINAL CLINICAL IMPRESSION(S) / ED DIAGNOSES  Final diagnoses:  Thumb laceration, right, initial encounter  Infectious tenosynovitis     Melvenia Needles, PA-C 02/22/16 1845  Dannielle Karvonen [place], PA-C 02/22/16 1851  Carrie Mew, MD 02/23/16 [phone]

## 2016-02-22 NOTE — ED Notes (Signed)
Lac to right thmb last week .  Now with swelling to area

## 2016-02-22 NOTE — Discharge Instructions (Signed)
Laceration Care, Adult  A laceration is a cut that goes through all layers of the skin. The cut also goes into the tissue that is right under the skin. Some cuts heal on their own. Others need to be closed with stitches (sutures), staples, skin adhesive strips, or wound glue. Taking care of your cut lowers your risk of infection and helps your cut to heal better.  HOW TO TAKE CARE OF YOUR CUT  For stitches or staples:  · Keep the wound clean and dry.  · If you were given a bandage (dressing), you should change it at least one time per day or as told by your doctor. You should also change it if it gets wet or dirty.  · Keep the wound completely dry for the first 24 hours or as told by your doctor. After that time, you may take a shower or a bath. However, make sure that the wound is not soaked in water until after the stitches or staples have been removed.  · Clean the wound one time each day or as told by your doctor:    Wash the wound with soap and water.    Rinse the wound with water until all of the soap comes off.    Pat the wound dry with a clean towel. Do not rub the wound.  · After you clean the wound, put a thin layer of antibiotic ointment on it as told by your doctor. This ointment:    Helps to prevent infection.    Keeps the bandage from sticking to the wound.  · Have your stitches or staples removed as told by your doctor.  If your doctor used skin adhesive strips:   · Keep the wound clean and dry.  · If you were given a bandage, you should change it at least one time per day or as told by your doctor. You should also change it if it gets dirty or wet.  · Do not get the skin adhesive strips wet. You can take a shower or a bath, but be careful to keep the wound dry.  · If the wound gets wet, pat it dry with a clean towel. Do not rub the wound.  · Skin adhesive strips fall off on their own. You can trim the strips as the wound heals. Do not remove any strips that are still stuck to the wound. They will  fall off after a while.  If your doctor used wound glue:  · Try to keep your wound dry, but you may briefly wet it in the shower or bath. Do not soak the wound in water, such as by swimming.  · After you take a shower or a bath, gently pat the wound dry with a clean towel. Do not rub the wound.  · Do not do any activities that will make you really sweaty until the skin glue has fallen off on its own.  · Do not apply liquid, cream, or ointment medicine to your wound while the skin glue is still on.  · If you were given a bandage, you should change it at least one time per day or as told by your doctor. You should also change it if it gets dirty or wet.  · If a bandage is placed over the wound, do not let the tape for the bandage touch the skin glue.  · Do not pick at the glue. The skin glue usually stays on for 5-10 days. Then, it   falls off of the skin.  General Instructions   · To help prevent scarring, make sure to cover your wound with sunscreen whenever you are outside after stitches are removed, after adhesive strips are removed, or when wound glue stays in place and the wound is healed. Make sure to wear a sunscreen of at least 30 SPF.  · Take over-the-counter and prescription medicines only as told by your doctor.  · If you were given antibiotic medicine or ointment, take or apply it as told by your doctor. Do not stop using the antibiotic even if your wound is getting better.  · Do not scratch or pick at the wound.  · Keep all follow-up visits as told by your doctor. This is important.  · Check your wound every day for signs of infection. Watch for:    Redness, swelling, or pain.    Fluid, blood, or pus.  · Raise (elevate) the injured area above the level of your heart while you are sitting or lying down, if possible.  GET HELP IF:  · You got a tetanus shot and you have any of these problems at the injection site:    Swelling.    Very bad pain.    Redness.    Bleeding.  · You have a fever.  · A wound that was  closed breaks open.  · You notice a bad smell coming from your wound or your bandage.  · You notice something coming out of the wound, such as wood or glass.  · Medicine does not help your pain.  · You have more redness, swelling, or pain at the site of your wound.  · You have fluid, blood, or pus coming from your wound.  · You notice a change in the color of your skin near your wound.  · You need to change the bandage often because fluid, blood, or pus is coming from the wound.  · You start to have a new rash.  · You start to have numbness around the wound.  GET HELP RIGHT AWAY IF:  · You have very bad swelling around the wound.  · Your pain suddenly gets worse and is very bad.  · You notice painful lumps near the wound or on skin that is anywhere on your body.  · You have a red streak going away from your wound.  · The wound is on your hand or foot and you cannot move a finger or toe like you usually can.  · The wound is on your hand or foot and you notice that your fingers or toes look pale or bluish.     This information is not intended to replace advice given to you by your health care provider. Make sure you discuss any questions you have with your health care provider.     Document Released: 04/08/2008 Document Revised: 03/07/2015 Document Reviewed: 10/17/2014  Elsevier Interactive Patient Education ©2016 [employer]

## 2016-02-22 NOTE — ED Provider Notes (Signed)
Medical screening examination/treatment/procedure(s) were conducted as a shared visit with non-physician practitioner(s) and myself.  I personally evaluated the patient during the encounter.  I saw the patient in conjunction with the physician assistant. I discussed her evaluation and findings with her and agree with her assessment and plan with the following comments:  Patient with multiple traumatic injuries to the right thumb over the past week. The patient reports that when he initially injured it he cut it with a glass tube from an old TV set, and he was able to visualize bone through the depth of the wound and thought that he also saw injured tendon-like structure.  On exam, there is a curvilinear flap-like laceration over the first MCP. There is swelling and severe tenderness with some purulent drainage from the wound edges. He is holding the thumb in a neutral position and has severe pain with any flexion or extension of the MCP joint passively. He additionally has tenderness along the tendon sheath proximally. There is intact perfusion of the thumb, intact sensation. There is surrounding erythema about the laceration wound. No crepitus. Compartments soft.  With the historical elements and today's exam findings, there is a high suspicion for septic arthritis versus tenosynovitis versus osteomyelitis of the right thumb or MCP joint. Patient plan for transfer to hand surgery specialist for definitive management of this likely will require exploration and debridement. Start IV antibiotics in the emergency department now.  Carrie Mew, MD 02/23/16 [phone]

## 2016-02-25 LAB — WOUND CULTURE
Gram Stain: NONE SEEN
SPECIAL REQUESTS: NORMAL

## 2016-02-29 DIAGNOSIS — S63641A Sprain of metacarpophalangeal joint of right thumb, initial encounter: Secondary | ICD-10-CM | POA: Diagnosis not present

## 2016-03-14 DIAGNOSIS — S63641A Sprain of metacarpophalangeal joint of right thumb, initial encounter: Secondary | ICD-10-CM | POA: Insufficient documentation

## 2016-03-14 DIAGNOSIS — S63641D Sprain of metacarpophalangeal joint of right thumb, subsequent encounter: Secondary | ICD-10-CM | POA: Diagnosis not present

## 2016-03-26 DIAGNOSIS — K219 Gastro-esophageal reflux disease without esophagitis: Secondary | ICD-10-CM | POA: Diagnosis not present

## 2016-03-26 DIAGNOSIS — R110 Nausea: Secondary | ICD-10-CM | POA: Diagnosis not present

## 2016-03-26 DIAGNOSIS — I10 Essential (primary) hypertension: Secondary | ICD-10-CM | POA: Diagnosis not present

## 2016-03-26 DIAGNOSIS — M5440 Lumbago with sciatica, unspecified side: Secondary | ICD-10-CM | POA: Diagnosis not present

## 2016-03-26 DIAGNOSIS — E039 Hypothyroidism, unspecified: Secondary | ICD-10-CM | POA: Diagnosis not present

## 2016-03-26 DIAGNOSIS — S65411S Laceration of blood vessel of right thumb, sequela: Secondary | ICD-10-CM | POA: Diagnosis not present

## 2016-03-26 DIAGNOSIS — L03011 Cellulitis of right finger: Secondary | ICD-10-CM | POA: Diagnosis not present

## 2016-03-26 DIAGNOSIS — E782 Mixed hyperlipidemia: Secondary | ICD-10-CM | POA: Diagnosis not present

## 2016-06-25 DIAGNOSIS — M5440 Lumbago with sciatica, unspecified side: Secondary | ICD-10-CM | POA: Diagnosis not present

## 2016-06-25 DIAGNOSIS — R110 Nausea: Secondary | ICD-10-CM | POA: Diagnosis not present

## 2016-06-25 DIAGNOSIS — Z0001 Encounter for general adult medical examination with abnormal findings: Secondary | ICD-10-CM | POA: Diagnosis not present

## 2016-06-25 DIAGNOSIS — E039 Hypothyroidism, unspecified: Secondary | ICD-10-CM | POA: Diagnosis not present

## 2016-06-25 DIAGNOSIS — K219 Gastro-esophageal reflux disease without esophagitis: Secondary | ICD-10-CM | POA: Diagnosis not present

## 2016-06-25 DIAGNOSIS — F17210 Nicotine dependence, cigarettes, uncomplicated: Secondary | ICD-10-CM | POA: Diagnosis not present

## 2016-06-25 DIAGNOSIS — I10 Essential (primary) hypertension: Secondary | ICD-10-CM | POA: Diagnosis not present

## 2016-06-25 DIAGNOSIS — F329 Major depressive disorder, single episode, unspecified: Secondary | ICD-10-CM | POA: Diagnosis not present

## 2016-06-26 ENCOUNTER — Other Ambulatory Visit
Admission: RE | Admit: 2016-06-26 | Discharge: 2016-06-26 | Disposition: A | Discharge status: Home / Self Care | Payer: PPO | Source: Ambulatory Visit | Attending: Nurse Practitioner | Admitting: Nurse Practitioner

## 2016-06-26 DIAGNOSIS — Z0000 Encounter for general adult medical examination without abnormal findings: Secondary | ICD-10-CM | POA: Diagnosis not present

## 2016-06-26 DIAGNOSIS — R631 Polydipsia: Secondary | ICD-10-CM | POA: Diagnosis not present

## 2016-06-26 DIAGNOSIS — R358 Other polyuria: Secondary | ICD-10-CM | POA: Diagnosis not present

## 2016-06-26 DIAGNOSIS — E039 Hypothyroidism, unspecified: Secondary | ICD-10-CM | POA: Insufficient documentation

## 2016-06-26 LAB — CBC WITH DIFFERENTIAL/PLATELET
Basophils Absolute: 0 10*3/uL (ref 0–0.1)
Basophils Relative: 1 %
EOS ABS: 0.1 10*3/uL (ref 0–0.7)
HEMATOCRIT: 43.9 % (ref 40.0–52.0)
HEMOGLOBIN: 15.7 g/dL (ref 13.0–18.0)
LYMPHS ABS: 1.3 10*3/uL (ref 1.0–3.6)
MCH: 31.9 pg (ref 26.0–34.0)
MCHC: 35.7 g/dL (ref 32.0–36.0)
MCV: 89.3 fL (ref 80.0–100.0)
MONO ABS: 0.6 10*3/uL (ref 0.2–1.0)
Monocytes Relative: 10 %
Neutro Abs: 4.1 10*3/uL (ref 1.4–6.5)
Neutrophils Relative %: 65 %
Platelets: 293 10*3/uL (ref 150–440)
RBC: 4.92 MIL/uL (ref 4.40–5.90)
RDW: 13.5 % (ref 11.5–14.5)
WBC: 6.2 10*3/uL (ref 3.8–10.6)

## 2016-06-26 LAB — COMPREHENSIVE METABOLIC PANEL
ALK PHOS: 61 U/L (ref 38–126)
ALT: 14 U/L — AB (ref 17–63)
AST: 15 U/L (ref 15–41)
Albumin: 4.3 g/dL (ref 3.5–5.0)
Anion gap: 6 (ref 5–15)
BILIRUBIN TOTAL: 0.8 mg/dL (ref 0.3–1.2)
BUN: 12 mg/dL (ref 6–20)
CALCIUM: 9.2 mg/dL (ref 8.9–10.3)
CO2: 25 mmol/L (ref 22–32)
CREATININE: 0.71 mg/dL (ref 0.61–1.24)
Chloride: 106 mmol/L (ref 101–111)
Glucose, Bld: 100 mg/dL — ABNORMAL HIGH (ref 65–99)
Potassium: 3.8 mmol/L (ref 3.5–5.1)
Sodium: 137 mmol/L (ref 135–145)
Total Protein: 7 g/dL (ref 6.5–8.1)

## 2016-06-26 LAB — LIPID PANEL
CHOLESTEROL: 192 mg/dL (ref 0–200)
HDL: 37 mg/dL — ABNORMAL LOW (ref >40)
LDL Cholesterol: 119 mg/dL — ABNORMAL HIGH (ref 0–99)
TRIGLYCERIDES: 180 mg/dL — AB (ref <150)
Total CHOL/HDL Ratio: 5.2 RATIO
VLDL: 36 mg/dL (ref 0–40)

## 2016-06-26 LAB — TSH: TSH: 0.923 u[IU]/mL (ref 0.350–4.500)

## 2016-06-26 LAB — HEMOGLOBIN A1C: HEMOGLOBIN A1C: 5.2 % (ref 4.0–6.0)

## 2016-06-27 LAB — T4, FREE: Free T4: 1.38 ng/dL — ABNORMAL HIGH (ref 0.61–1.12)

## 2016-06-27 LAB — T4

## 2016-07-10 ENCOUNTER — Encounter (HOSPITAL_COMMUNITY): Payer: Self-pay | Admitting: *Deleted

## 2016-07-10 ENCOUNTER — Emergency Department (HOSPITAL_COMMUNITY)
Admission: EM | Admit: 2016-07-10 | Discharge: 2016-07-10 | Disposition: A | Discharge status: Home / Self Care | Payer: PPO | Attending: Emergency Medicine | Admitting: Emergency Medicine

## 2016-07-10 DIAGNOSIS — Z79899 Other long term (current) drug therapy: Secondary | ICD-10-CM | POA: Diagnosis not present

## 2016-07-10 DIAGNOSIS — L03114 Cellulitis of left upper limb: Secondary | ICD-10-CM | POA: Diagnosis not present

## 2016-07-10 DIAGNOSIS — F17210 Nicotine dependence, cigarettes, uncomplicated: Secondary | ICD-10-CM | POA: Insufficient documentation

## 2016-07-10 DIAGNOSIS — E039 Hypothyroidism, unspecified: Secondary | ICD-10-CM | POA: Diagnosis not present

## 2016-07-10 DIAGNOSIS — I10 Essential (primary) hypertension: Secondary | ICD-10-CM | POA: Insufficient documentation

## 2016-07-10 DIAGNOSIS — M79642 Pain in left hand: Secondary | ICD-10-CM | POA: Diagnosis not present

## 2016-07-10 MED ORDER — SULFAMETHOXAZOLE-TRIMETHOPRIM 800-160 MG PO TABS: 1.0000 | ORAL_TABLET | Freq: Two times a day (BID) | ORAL | 0 refills | Status: AC

## 2016-07-10 MED ORDER — OXYCODONE-ACETAMINOPHEN 5-325 MG PO TABS
2.0000 | ORAL_TABLET | Freq: Once | ORAL | Status: AC
  Administered 2016-07-10: 2 via ORAL
  Filled 2016-07-10: qty 2

## 2016-07-10 MED ORDER — OXYCODONE-ACETAMINOPHEN 5-325 MG PO TABS: 1.0000 | ORAL_TABLET | Freq: Four times a day (QID) | ORAL | 0 refills | Status: DC | PRN

## 2016-07-10 MED ORDER — CEPHALEXIN 500 MG PO CAPS: 500.0000 mg | ORAL_CAPSULE | Freq: Three times a day (TID) | ORAL | 0 refills | Status: DC

## 2016-07-10 NOTE — Discharge Instructions (Signed)
You have been diagnosed with cellulitis. I am discharging you with the following medications: 1) BACTRIM AND KEFLEX Please take all of your antibiotics until finished!   You may develop abdominal discomfort or diarrhea from the antibiotic.  You may help offset this with probiotics which you can buy or get in yogurt. Do not eat  or take the probiotics until 2 hours after your antibiotic.  2) PERCOCET Do not drive, operate heavy machinery, drink alcohol, or take other tylenol containing products with this medicine. Follow up with your primary care doctor in 2-3 days. If you do not have a primary care doctor please follow up at the [facility].  HOME CARE INSTRUCTIONS  Take your antibiotics as directed. Finish them even if you start to feel better. Keep the infected arm or leg elevated to reduce swelling. Apply a warm cloth to the affected area up to 4 times per day to relieve pain. Only take over-the-counter or prescription medicines for pain, discomfort, or fever as directed by your caregiver. Keep all follow-up appointments as directed by your caregiver. SEEK MEDICAL CARE IF:  You notice red streaks coming from the infected area. Your red area gets larger or turns dark in color. Your bone or joint underneath the infected area becomes painful after the skin has healed. Your infection returns in the same area or another area. You notice a swollen bump in the infected area. You develop new symptoms. SEEK IMMEDIATE MEDICAL CARE IF:  You have a fever. You feel very sleepy. You develop vomiting or diarrhea. You have a general ill feeling (malaise) with muscle aches and pains.

## 2016-07-10 NOTE — ED Notes (Signed)
PT DISCHARGED. INSTRUCTIONS AND PRESCRIPTIONS GIVEN. AAOX4. PT IN NO APPARENT DISTRESS. THE OPPORTUNITY TO ASK QUESTIONS WAS PROVIDED. 

## 2016-07-10 NOTE — ED Provider Notes (Signed)
[facility] DEPT Provider Note   CSN: [id number] Arrival date & time: 07/10/16  1434  By signing my name below, I, Jeremy Delgado, attest that this documentation has been prepared under the direction and in the presence of Margarita Mail, PA-C. Electronically Signed: Judithann Delgado, ED Scribe. 07/10/16. 5:57 PM.    History   Chief Complaint Chief Complaint  Patient presents with  . Hand Pain    HPI Comments: Jeremy Delgado is a 45 y.o. male who presents to the Emergency Department complaining of gradually worsening moderately painful area of swelling and redness to his left hand that travels up his left forearm onset last night. He denies any recent injuries or trauma to the hand but explains that he had an episode of left hand itchiness while working at home last night. He adds that he had been scratching this hand prior to onset of his symptoms. No alleviating factors noted. Pt states that he tried Benadryl with no relief. He reports an allergy to Prednisone, codeine, and bee pollen. No fever, chills, or generalized rash.    The history is provided by the patient. No language interpreter was used.    Past Medical History:  Diagnosis Date  . Addiction to drug ([place]) 04/06/2015  . ANXIETY 03/11/2008   Qualifier: Diagnosis of  By: Hassell Done FNP, Tori Milks     . Arthritis   . BACK PAIN 02/17/2007   Qualifier: Diagnosis of  By: Silvio Pate MD, [facility] Cancer   . Chronic pain   . Colitis    self  . DEPRESSION 11/05/2003   Qualifier: Diagnosis of  By: Hassell Done FNP, Tori Milks    . GERD 12/29/2008   Qualifier: Diagnosis of  By: Hassell Done FNP, Tori Milks    . H/O arthritis 02/20/2015  . H/O: hypothyroidism 02/20/2015  . HYPERCHOLESTEROLEMIA 03/11/2008   Qualifier: Diagnosis of  By: Hassell Done FNP, Tori Milks    . Hypertension   . PAIN IN JOINT PELVIC REGION AND THIGH 03/11/2008   Qualifier: Diagnosis of  By: Hassell Done FNP, Tori Milks    . Renal disorder   . Sciatica   . TOBACCO ABUSE 07/15/2008   Qualifier:  Diagnosis of  By: Hassell Done FNP, Tori Milks      Patient Active Problem List   Diagnosis Date Noted  . Addiction to drug ([place]) 04/06/2015  . Combinations of drug dependence excluding opioid type drug ([place]) 04/06/2015  . Pain 04/06/2015  . Episodic paroxysmal anxiety disorder 04/06/2015  . Noninfectious gastroenteritis and colitis 04/06/2015  . H/O arthritis 02/20/2015  . H/O elevated lipids 02/20/2015  . Backache 02/20/2015  . H/O: hypothyroidism 02/20/2015  . Depression, major, recurrent, moderate ([place]) 02/20/2015  . PHARYNGITIS 01/31/2009  . COUGH 01/31/2009  . GERD 12/29/2008  . TOBACCO ABUSE 07/15/2008  . HYPERCHOLESTEROLEMIA 03/11/2008  . ANXIETY 03/11/2008  . PAIN IN JOINT PELVIC REGION AND THIGH 03/11/2008  . FLANK PAIN, LEFT 03/11/2008  . BACK PAIN 02/17/2007  . DEPRESSION 11/05/2003    Past Surgical History:  Procedure Laterality Date  . HEMORRHOID SURGERY         Home Medications    Prior to Admission medications   Medication Sig Start Date End Date Taking? Authorizing Provider  cephALEXin (KEFLEX) 500 MG capsule Take 1 capsule (500 mg total) by mouth 3 (three) times daily. 02/22/16   Jenise V Bacon Menshew, PA-C  FLUoxetine (PROZAC) 40 MG capsule Take 1 capsule (40 mg total) by mouth daily. 07/06/15   Rainey Pines, MD  gabapentin (NEURONTIN) 300 MG capsule Take 1 capsule  by mouth 2 (two) times daily as needed (pain).  03/08/15   Historical Provider, MD  ibuprofen (ADVIL,MOTRIN) 600 MG tablet Take 1 tablet (600 mg total) by mouth every 6 (six) hours as needed for moderate pain. Patient not taking: Reported on 05/09/2015 04/06/15   Joanne Gavel, MD  levothyroxine (SYNTHROID, LEVOTHROID) 50 MCG tablet Take 50 mcg by mouth every morning.  03/13/15   Historical Provider, MD  lisinopril (PRINIVIL,ZESTRIL) 10 MG tablet Take 10 mg by mouth daily.  03/08/15   Historical Provider, MD  Melatonin 3 MG CAPS Take 1 capsule by mouth at bedtime as needed (sleep).     Historical Provider, MD    meloxicam (MOBIC) 7.5 MG tablet Take 7.5 mg by mouth daily.  04/07/15   Historical Provider, MD  naproxen (EC NAPROSYN) 500 MG EC tablet Take 1 tablet (500 mg total) by mouth 2 (two) times daily with a meal. 02/22/16   Jenise V Bacon Menshew, PA-C  omeprazole (PRILOSEC) 20 MG capsule Take 20 mg by mouth daily.    Historical Provider, MD  pravastatin (PRAVACHOL) 40 MG tablet Take 40 mg by mouth every evening.    Historical Provider, MD    Family History Family History  Problem Relation Age of Onset  . Depression Mother   . Hypertension Father   . Heart failure Father   . Diabetes Paternal Grandfather     Social History Social History  Substance Use Topics  . Smoking status: Current Every Day Smoker    Packs/day: 0.50    Types: Cigarettes    Start date: 07/05/1990  . Smokeless tobacco: Never Used     Comment: pt currently uses a E-cig  . Alcohol use 0.0 oz/week     Comment: social      Allergies   Bee pollen; Codeine; and Prednisone   Review of Systems Review of Systems  Constitutional: Negative for chills and fever.  Skin: Positive for wound. Negative for rash.     Physical Exam Updated Vital Signs BP (!) 116/102 (BP Location: Right Arm)   Pulse 71   Temp 98.3 F (36.8 C) (Oral)   Resp 18   SpO2 99%   Physical Exam  Constitutional: He is oriented to person, place, and time. He appears well-developed and well-nourished. No distress.  HENT:  Head: Normocephalic and atraumatic.  Eyes: Conjunctivae and EOM are normal.  Neck: Neck supple. No tracheal deviation present.  Cardiovascular: Normal rate.   Pulmonary/Chest: Effort normal. No respiratory distress.  Musculoskeletal: Normal range of motion.  Radial pulses intact and strong  Neurological: He is alert and oriented to person, place, and time.  Skin: Skin is warm and dry.  Significant swelling and erythema up left forearm with lymphangitis to his left elbow  Psychiatric: He has a normal mood and affect. His  behavior is normal.  Nursing note and vitals reviewed.    ED Treatments / Results  DIAGNOSTIC STUDIES: Oxygen Saturation is 99% on RA, normal by my interpretation.    COORDINATION OF CARE: 5:40 PM- Pt advised of plan for treatment and pt agrees. Pt will receive Percocet.    Procedures Procedures (including critical care time)  Medications Ordered in ED Medications - No data to display   Initial Impression / Assessment and Plan / ED Course  Margarita Mail, PA-C has reviewed the triage vital signs and the nursing notes.  Pertinent labs & imaging results that were available during my care of the patient were reviewed by me and considered  in my medical decision making (see chart for details).  Clinical Course    Patient presentation consistent with cellulitis. Afebrile. No tachycardia, hypotension or other symptoms suggestive of severe infection. Area has been demarcated and pt advised to follow up for wound check in 2-3 days, sooner for worsening systemic symptoms, new lymphangitis, or significant spread of erythema past line of demarcation. Will discharge with bactrim/keflex. Return precautions discussed. Pt appears safe for discharge.    Final Clinical Impressions(s) / ED Diagnoses   Final diagnoses:  None    New Prescriptions New Prescriptions   No medications on file   I personally performed the services described in this documentation, which was scribed in my presence. The recorded information has been reviewed and is accurate.      Margarita Mail, PA-C 07/10/16 2332    Everlene Balls, MD 07/11/16 2153

## 2016-07-10 NOTE — ED Triage Notes (Signed)
Pt complains of pain, swelling and itching to left hand since last night. Pt took benadryl, which he states has not helped.

## 2016-07-12 ENCOUNTER — Encounter (HOSPITAL_COMMUNITY): Payer: Self-pay | Admitting: Emergency Medicine

## 2016-07-12 ENCOUNTER — Ambulatory Visit (HOSPITAL_COMMUNITY)
Admission: EM | Admit: 2016-07-12 | Discharge: 2016-07-12 | Disposition: A | Discharge status: Home / Self Care | Payer: PPO | Attending: Family Medicine | Admitting: Family Medicine

## 2016-07-12 DIAGNOSIS — L03114 Cellulitis of left upper limb: Secondary | ICD-10-CM

## 2016-07-12 DIAGNOSIS — Z09 Encounter for follow-up examination after completed treatment for conditions other than malignant neoplasm: Secondary | ICD-10-CM

## 2016-07-12 NOTE — ED Notes (Signed)
D/c by Frank Patrick, PA  

## 2016-07-12 NOTE — Discharge Instructions (Signed)
YOUR CELLULITIS IS IMPROVING  BE SURE TO COMPLETE YOUR ANTIBIOTICS  USE WARM COMPRESSES TO HELP WITH SWELLING AND REDNESS  TYLENOL OR IBUPROFEN FOR DISCOMFORT.   ELEVATION OF THE LIMB MAY HELP WITH DISCOMFORT AND SWELLING.

## 2016-07-12 NOTE — ED Provider Notes (Signed)
CSN: [id number]     Arrival date & time 07/12/16  1249 History   First MD Initiated Contact with Patient 07/12/16 1326     Chief Complaint  Patient presents with  . Follow-up   (Consider location/radiation/quality/duration/timing/severity/associated sxs/prior Treatment) HPI History obtained from patient:  Pt presents with the cc of:  Medical recheck for cellulitis Duration of symptoms: 2-3 days Treatment prior to arrival: Was seen in the emergency department and started on antibiotics and analgesia Context: Onset of cellulitis 2-3 days ago seen and treated in the emergency department. He was advised to come to urgent care for medical recheck and medication renewal if needed. Other symptoms include: Continued pain and wrist Pain score: 3 FAMILY HISTORY: Hypertension    Past Medical History:  Diagnosis Date  . Addiction to drug ([place]) 04/06/2015  . ANXIETY 03/11/2008   Qualifier: Diagnosis of  By: Hassell Done FNP, Tori Milks     . Arthritis   . BACK PAIN 02/17/2007   Qualifier: Diagnosis of  By: Silvio Pate MD, [facility] Cancer   . Chronic pain   . Colitis    self  . DEPRESSION 11/05/2003   Qualifier: Diagnosis of  By: Hassell Done FNP, Tori Milks    . GERD 12/29/2008   Qualifier: Diagnosis of  By: Hassell Done FNP, Tori Milks    . H/O arthritis 02/20/2015  . H/O: hypothyroidism 02/20/2015  . HYPERCHOLESTEROLEMIA 03/11/2008   Qualifier: Diagnosis of  By: Hassell Done FNP, Tori Milks    . Hypertension   . PAIN IN JOINT PELVIC REGION AND THIGH 03/11/2008   Qualifier: Diagnosis of  By: Hassell Done FNP, Tori Milks    . Renal disorder   . Sciatica   . TOBACCO ABUSE 07/15/2008   Qualifier: Diagnosis of  By: Hassell Done FNP, Tori Milks     Past Surgical History:  Procedure Laterality Date  . HEMORRHOID SURGERY     Family History  Problem Relation Age of Onset  . Depression Mother   . Hypertension Father   . Heart failure Father   . Diabetes Paternal Grandfather    Social History  Substance Use Topics  . Smoking status: Current Every Day  Smoker    Packs/day: 0.50    Types: Cigarettes    Start date: 07/05/1990  . Smokeless tobacco: Never Used     Comment: pt currently uses a E-cig  . Alcohol use 0.0 oz/week     Comment: social     Review of Systems  Denies: HEADACHE, NAUSEA, ABDOMINAL PAIN, CHEST PAIN, CONGESTION, DYSURIA, SHORTNESS OF BREATH  Allergies  Bee pollen; Codeine; and Prednisone  Home Medications   Prior to Admission medications   Medication Sig Start Date End Date Taking? Authorizing Provider  cephALEXin (KEFLEX) 500 MG capsule Take 1 capsule (500 mg total) by mouth 3 (three) times daily. 07/10/16  Yes Margarita Mail, PA-C  FLUoxetine (PROZAC) 40 MG capsule Take 1 capsule (40 mg total) by mouth daily. 07/06/15  Yes Rainey Pines, MD  gabapentin (NEURONTIN) 300 MG capsule Take 1 capsule by mouth 2 (two) times daily as needed (pain).  03/08/15  Yes Historical Provider, MD  levothyroxine (SYNTHROID, LEVOTHROID) 50 MCG tablet Take 50 mcg by mouth every morning.  03/13/15  Yes Historical Provider, MD  lisinopril (PRINIVIL,ZESTRIL) 10 MG tablet Take 10 mg by mouth daily.  03/08/15  Yes Historical Provider, MD  omeprazole (PRILOSEC) 20 MG capsule Take 20 mg by mouth daily.   Yes Historical Provider, MD  oxyCODONE-acetaminophen (PERCOCET) 5-325 MG tablet Take 1-2 tablets by mouth every 6 (six) hours as  needed. 07/10/16  Yes Margarita Mail, PA-C  pravastatin (PRAVACHOL) 40 MG tablet Take 40 mg by mouth every evening.   Yes Historical Provider, MD  sulfamethoxazole-trimethoprim (BACTRIM DS,SEPTRA DS) 800-160 MG tablet Take 1 tablet by mouth 2 (two) times daily. 07/10/16 07/17/16 Yes Abigail Harris, PA-C  ibuprofen (ADVIL,MOTRIN) 600 MG tablet Take 1 tablet (600 mg total) by mouth every 6 (six) hours as needed for moderate pain. Patient not taking: Reported on 05/09/2015 04/06/15   Joanne Gavel, MD  Melatonin 3 MG CAPS Take 1 capsule by mouth at bedtime as needed (sleep).     Historical Provider, MD  meloxicam (MOBIC) 7.5 MG tablet Take  7.5 mg by mouth daily.  04/07/15   Historical Provider, MD  naproxen (EC NAPROSYN) 500 MG EC tablet Take 1 tablet (500 mg total) by mouth 2 (two) times daily with a meal. 02/22/16   Jenise V Bacon Menshew, PA-C   Meds Ordered and Administered this Visit  Medications - No data to display  BP 138/84 (BP Location: Right Arm)   Pulse 65   Temp 98.3 F (36.8 C) (Oral)   Resp 16   SpO2 100%  No data found.   Physical Exam NURSES NOTES AND VITAL SIGNS REVIEWED. CONSTITUTIONAL: Well developed, well nourished, no acute distress HEENT: normocephalic, atraumatic EYES: Conjunctiva normal NECK:normal ROM, supple, no adenopathy PULMONARY:No respiratory distress, normal effort ABDOMINAL: Soft, ND, NT BS+, No CVAT MUSCULOSKELETAL: Normal ROM of all extremities, , NO REDNESS LEFT LOWER ARM SKIN: warm and dry without rash PSYCHIATRIC: Mood and affect, behavior are normal  Urgent Care Course   Clinical Course    Procedures (including critical care time)  Labs Review Labs Reviewed - No data to display  Imaging Review No results found.   Visual Acuity Review  Right Eye Distance:   Left Eye Distance:   Bilateral Distance:    Right Eye Near:   Left Eye Near:    Bilateral Near:        PT IS ADVISED THAT HIS CELLULITIS IS GETTING BETTER. THERE ARE NO INDICATIONS THAT HE NEEDS A HIGHER LEVEL OF TREATMENT.  MDM   1. Follow-up examination     Patient is reassured that there are no issues that require transfer to higher level of care at this time or additional tests. Patient is advised to continue home symptomatic treatment. Patient is advised that if there are new or worsening symptoms to attend the emergency department, contact primary care provider, or return to UC. Instructions of care provided discharged home in stable condition.    THIS NOTE WAS GENERATED USING A VOICE RECOGNITION SOFTWARE PROGRAM. ALL REASONABLE EFFORTS  WERE MADE TO PROOFREAD THIS DOCUMENT FOR ACCURACY.  I  have verbally reviewed the discharge instructions with the patient. A printed AVS was given to the patient.  All questions were answered prior to discharge.          Konrad Felix, PA 07/12/16 1416

## 2016-07-12 NOTE — ED Triage Notes (Signed)
Here for a f/u from [facility]  Was treated for cellulitis of left arm ... Reports getting better and voices no new concerns.   Taking Keflex and Bactrim and tolerating well  A&O x4... NAD

## 2016-08-05 ENCOUNTER — Encounter (HOSPITAL_COMMUNITY): Payer: Self-pay | Admitting: Emergency Medicine

## 2016-08-05 ENCOUNTER — Emergency Department (HOSPITAL_COMMUNITY)
Admission: EM | Admit: 2016-08-05 | Discharge: 2016-08-05 | Disposition: A | Discharge status: Home / Self Care | Payer: PPO | Attending: Emergency Medicine | Admitting: Emergency Medicine

## 2016-08-05 DIAGNOSIS — Z79899 Other long term (current) drug therapy: Secondary | ICD-10-CM | POA: Insufficient documentation

## 2016-08-05 DIAGNOSIS — R51 Headache: Secondary | ICD-10-CM | POA: Diagnosis not present

## 2016-08-05 DIAGNOSIS — E039 Hypothyroidism, unspecified: Secondary | ICD-10-CM | POA: Diagnosis not present

## 2016-08-05 DIAGNOSIS — G44009 Cluster headache syndrome, unspecified, not intractable: Secondary | ICD-10-CM | POA: Diagnosis not present

## 2016-08-05 DIAGNOSIS — I10 Essential (primary) hypertension: Secondary | ICD-10-CM | POA: Insufficient documentation

## 2016-08-05 DIAGNOSIS — F17210 Nicotine dependence, cigarettes, uncomplicated: Secondary | ICD-10-CM | POA: Insufficient documentation

## 2016-08-05 MED ORDER — METOCLOPRAMIDE HCL 5 MG/ML IJ SOLN
10.0000 mg | Freq: Once | INTRAMUSCULAR | Status: AC
  Administered 2016-08-05: 10 mg via INTRAVENOUS
  Filled 2016-08-05: qty 2

## 2016-08-05 MED ORDER — KETOROLAC TROMETHAMINE 30 MG/ML IJ SOLN
30.0000 mg | Freq: Once | INTRAMUSCULAR | Status: AC
  Administered 2016-08-05: 30 mg via INTRAVENOUS
  Filled 2016-08-05: qty 1

## 2016-08-05 NOTE — ED Notes (Signed)
Bed: WTR8 Expected date:  Expected time:  Means of arrival:  Comments: 

## 2016-08-05 NOTE — Discharge Instructions (Signed)
See your primary care physician if symptoms continue or return if concerned for any reason.

## 2016-08-05 NOTE — ED Provider Notes (Addendum)
[facility] DEPT Provider Note   CSN: [id number] Arrival date & time: 08/05/16  1623     History   Chief Complaint Chief Complaint  Patient presents with  . Headache    HPI Jeremy Delgado is a 45 y.o. male.Patient developed right-sided parietal occipital headache 10 AM today, gradual onset. Headache is throbbing in nature. Associated symptoms include photophobia and nausea. He treated himself with ibuprofen, without relief. No other associated symptoms. Pain worse with  [id number] lights not improved by anything. Patient reports he gets aches every 2-3 days.  HPI  Past Medical History:  Diagnosis Date  . Addiction to drug ([place]) 04/06/2015  . ANXIETY 03/11/2008   Qualifier: Diagnosis of  By: Hassell Done FNP, Tori Milks     . Arthritis   . BACK PAIN 02/17/2007   Qualifier: Diagnosis of  By: Silvio Pate MD, Baird Cancer   . Chronic pain   . Colitis    self  . DEPRESSION 11/05/2003   Qualifier: Diagnosis of  By: Hassell Done FNP, Tori Milks    . GERD 12/29/2008   Qualifier: Diagnosis of  By: Hassell Done FNP, Tori Milks    . H/O arthritis 02/20/2015  . H/O: hypothyroidism 02/20/2015  . HYPERCHOLESTEROLEMIA 03/11/2008   Qualifier: Diagnosis of  By: Hassell Done FNP, Tori Milks    . Hypertension   . PAIN IN JOINT PELVIC REGION AND THIGH 03/11/2008   Qualifier: Diagnosis of  By: Hassell Done FNP, Tori Milks    . Renal disorder   . Sciatica   . TOBACCO ABUSE 07/15/2008   Qualifier: Diagnosis of  By: Hassell Done FNP, Tori Milks      Patient Active Problem List   Diagnosis Date Noted  . Addiction to drug ([place]) 04/06/2015  . Combinations of drug dependence excluding opioid type drug ([place]) 04/06/2015  . Pain 04/06/2015  . Episodic paroxysmal anxiety disorder 04/06/2015  . Noninfectious gastroenteritis and colitis 04/06/2015  . H/O arthritis 02/20/2015  . H/O elevated lipids 02/20/2015  . Backache 02/20/2015  . H/O: hypothyroidism 02/20/2015  . Depression, major, recurrent, moderate ([place]) 02/20/2015  . PHARYNGITIS 01/31/2009    . COUGH 01/31/2009  . GERD 12/29/2008  . TOBACCO ABUSE 07/15/2008  . HYPERCHOLESTEROLEMIA 03/11/2008  . ANXIETY 03/11/2008  . PAIN IN JOINT PELVIC REGION AND THIGH 03/11/2008  . FLANK PAIN, LEFT 03/11/2008  . BACK PAIN 02/17/2007  . DEPRESSION 11/05/2003    Past Surgical History:  Procedure Laterality Date  . HEMORRHOID SURGERY         Home Medications    Prior to Admission medications   Medication Sig Start Date End Date Taking? Authorizing Provider  cephALEXin (KEFLEX) 500 MG capsule Take 1 capsule (500 mg total) by mouth 3 (three) times daily. 07/10/16   Margarita Mail, PA-C  FLUoxetine (PROZAC) 40 MG capsule Take 1 capsule (40 mg total) by mouth daily. 07/06/15   Rainey Pines, MD  gabapentin (NEURONTIN) 300 MG capsule Take 1 capsule by mouth 2 (two) times daily as needed (pain).  03/08/15   Historical Provider, MD  ibuprofen (ADVIL,MOTRIN) 600 MG tablet Take 1 tablet (600 mg total) by mouth every 6 (six) hours as needed for moderate pain. Patient not taking: Reported on 05/09/2015 04/06/15   Joanne Gavel, MD  levothyroxine (SYNTHROID, LEVOTHROID) 50 MCG tablet Take 50 mcg by mouth every morning.  03/13/15   Historical Provider, MD  lisinopril (PRINIVIL,ZESTRIL) 10 MG tablet Take 10 mg by mouth daily.  03/08/15   Historical Provider, MD  Melatonin 3 MG CAPS Take 1 capsule by mouth at bedtime  as needed (sleep).     Historical Provider, MD  meloxicam (MOBIC) 7.5 MG tablet Take 7.5 mg by mouth daily.  04/07/15   Historical Provider, MD  naproxen (EC NAPROSYN) 500 MG EC tablet Take 1 tablet (500 mg total) by mouth 2 (two) times daily with a meal. 02/22/16   Jenise V Bacon Menshew, PA-C  omeprazole (PRILOSEC) 20 MG capsule Take 20 mg by mouth daily.    Historical Provider, MD  oxyCODONE-acetaminophen (PERCOCET) 5-325 MG tablet Take 1-2 tablets by mouth every 6 (six) hours as needed. 07/10/16   Margarita Mail, PA-C  pravastatin (PRAVACHOL) 40 MG tablet Take 40 mg by mouth every evening.    Historical  Provider, MD    Family History Family History  Problem Relation Age of Onset  . Depression Mother   . Hypertension Father   . Heart failure Father   . Diabetes Paternal Grandfather     Social History Social History  Substance Use Topics  . Smoking status: Current Every Day Smoker    Packs/day: 0.50    Types: Cigarettes    Start date: 07/05/1990  . Smokeless tobacco: Never Used     Comment: pt currently uses a E-cig  . Alcohol use 0.0 oz/week     Comment: social      Allergies   Bee pollen; Codeine; and Prednisone   Review of Systems Review of Systems  Constitutional: Negative.   Eyes: Positive for photophobia.  Respiratory: Negative.   Cardiovascular: Positive for chest pain.       Syncope  Gastrointestinal: Positive for nausea.  Musculoskeletal: Negative.   Skin: Negative.   Allergic/Immunologic: Negative.   Neurological: Positive for headaches.  Psychiatric/Behavioral: Negative.   All other systems reviewed and are negative.    Physical Exam Updated Vital Signs BP 137/88   Pulse 68   Temp 98.4 F (36.9 C)   Resp 13   Ht 6' (1.829 m)   Wt 180 lb (81.6 kg)   SpO2 97%   BMI 24.41 kg/m   Physical Exam  Constitutional: He appears well-developed and well-nourished. No distress.  HENT:  Head: Normocephalic and atraumatic.  Bilateral tympanic membranes normal  Eyes: Conjunctivae and EOM are normal. Pupils are equal, round, and reactive to light.  Neck: Neck supple. No tracheal deviation present. No thyromegaly present.  Cardiovascular: Normal rate and regular rhythm.   No murmur heard. Pulmonary/Chest: Effort normal and breath sounds normal.  Abdominal: Soft. Bowel sounds are normal. He exhibits no distension. There is no tenderness.  Musculoskeletal: Normal range of motion. He exhibits no edema or tenderness.  Neurological: He is alert. He has normal reflexes. Coordination normal.  DTRs symmetric bilaterally at knee jerk ankle jerk and biceps toes  downward going bilaterally. Gait normal Romberg normal pronator drift normal finger to nose normal  Skin: Skin is warm and dry. No rash noted.  Psychiatric: He has a normal mood and affect.  Nursing note and vitals reviewed.    ED Treatments / Results  Labs (all labs ordered are listed, but only abnormal results are displayed) Labs Reviewed - No data to display  EKG  EKG Interpretation None      5:40 PM nausea improved after treatment with intravenous Reglan however still complains of pain. IV Toradol ordered Radiology No results found.  Procedures Procedures (including critical care time)  Medications Ordered in ED Medications  metoCLOPramide (REGLAN) injection 10 mg (not administered)     Initial Impression / Assessment and Plan / ED Course  I have reviewed the triage vital signs and the nursing notes.  Pertinent labs & imaging results that were available during my care of the patient were reviewed by me and considered in my medical decision making (see chart for details).  Clinical Course   6:45 PM symptoms well-controlled after treatment with intravenous Reglan and Toradol. He feels ready to go home I don't feel that emergent imaging needed in light of gradual onset of symptoms, throbbing in nature and photophobia suggestive of migraine symptoms. I discussed imaging with patient and he is in agreement.Suspect cluster headache  Final Clinical Impressions(s) / ED Diagnoses  Plan follow-up with PMD if symptoms continue Final diagnoses:  None   Diagnosis nonspecific headache New Prescriptions New Prescriptions   No medications on file     Orlie Dakin, MD 08/05/16 Frances Furbish, MD 08/05/16 [phone]

## 2016-08-05 NOTE — ED Notes (Signed)
Jacubowitz MD aware and verbalizes will see pt in a few minutes and protocols UNneccessary at present time.

## 2016-08-05 NOTE — ED Triage Notes (Addendum)
Pt complaint of right posterior headache onset 1000 today; pt denies numbness or tingling. Pt strength equal and strong but pt reports "side stepping today." Neuro unremarkable otherwise.

## 2016-09-24 DIAGNOSIS — I10 Essential (primary) hypertension: Secondary | ICD-10-CM | POA: Diagnosis not present

## 2016-09-24 DIAGNOSIS — F329 Major depressive disorder, single episode, unspecified: Secondary | ICD-10-CM | POA: Diagnosis not present

## 2016-09-24 DIAGNOSIS — F17210 Nicotine dependence, cigarettes, uncomplicated: Secondary | ICD-10-CM | POA: Diagnosis not present

## 2016-09-24 DIAGNOSIS — E039 Hypothyroidism, unspecified: Secondary | ICD-10-CM | POA: Diagnosis not present

## 2016-09-24 DIAGNOSIS — J069 Acute upper respiratory infection, unspecified: Secondary | ICD-10-CM | POA: Diagnosis not present

## 2016-09-24 DIAGNOSIS — G894 Chronic pain syndrome: Secondary | ICD-10-CM | POA: Diagnosis not present

## 2016-12-08 ENCOUNTER — Emergency Department
Admission: EM | Admit: 2016-12-08 | Discharge: 2016-12-08 | Disposition: A | Discharge status: Home / Self Care | Payer: PPO | Attending: Emergency Medicine | Admitting: Emergency Medicine

## 2016-12-08 ENCOUNTER — Emergency Department: Payer: PPO

## 2016-12-08 ENCOUNTER — Encounter: Payer: Self-pay | Admitting: Emergency Medicine

## 2016-12-08 DIAGNOSIS — F0781 Postconcussional syndrome: Secondary | ICD-10-CM | POA: Diagnosis not present

## 2016-12-08 DIAGNOSIS — S069X1A Unspecified intracranial injury with loss of consciousness of 30 minutes or less, initial encounter: Secondary | ICD-10-CM | POA: Insufficient documentation

## 2016-12-08 DIAGNOSIS — Y9389 Activity, other specified: Secondary | ICD-10-CM | POA: Diagnosis not present

## 2016-12-08 DIAGNOSIS — Y999 Unspecified external cause status: Secondary | ICD-10-CM | POA: Diagnosis not present

## 2016-12-08 DIAGNOSIS — S0990XA Unspecified injury of head, initial encounter: Secondary | ICD-10-CM | POA: Diagnosis not present

## 2016-12-08 DIAGNOSIS — F17210 Nicotine dependence, cigarettes, uncomplicated: Secondary | ICD-10-CM | POA: Insufficient documentation

## 2016-12-08 DIAGNOSIS — E039 Hypothyroidism, unspecified: Secondary | ICD-10-CM | POA: Insufficient documentation

## 2016-12-08 DIAGNOSIS — I10 Essential (primary) hypertension: Secondary | ICD-10-CM | POA: Insufficient documentation

## 2016-12-08 DIAGNOSIS — S060X1A Concussion with loss of consciousness of 30 minutes or less, initial encounter: Secondary | ICD-10-CM

## 2016-12-08 DIAGNOSIS — Z79899 Other long term (current) drug therapy: Secondary | ICD-10-CM | POA: Diagnosis not present

## 2016-12-08 DIAGNOSIS — W1809XA Striking against other object with subsequent fall, initial encounter: Secondary | ICD-10-CM | POA: Diagnosis not present

## 2016-12-08 DIAGNOSIS — R51 Headache: Secondary | ICD-10-CM | POA: Diagnosis not present

## 2016-12-08 DIAGNOSIS — Y92002 Bathroom of unspecified non-institutional (private) residence single-family (private) house as the place of occurrence of the external cause: Secondary | ICD-10-CM | POA: Diagnosis not present

## 2016-12-08 LAB — BASIC METABOLIC PANEL
Anion gap: 5 (ref 5–15)
BUN: 14 mg/dL (ref 6–20)
CALCIUM: 8.8 mg/dL — AB (ref 8.9–10.3)
CO2: 25 mmol/L (ref 22–32)
CREATININE: 0.74 mg/dL (ref 0.61–1.24)
Chloride: 109 mmol/L (ref 101–111)
GFR calc non Af Amer: >60 mL/min (ref >60)
Glucose, Bld: 102 mg/dL — ABNORMAL HIGH (ref 65–99)
Potassium: 4.1 mmol/L (ref 3.5–5.1)
Sodium: 139 mmol/L (ref 135–145)

## 2016-12-08 LAB — CBC
HCT: 43.8 % (ref 40.0–52.0)
Hemoglobin: 14.7 g/dL (ref 13.0–18.0)
MCH: 30.8 pg (ref 26.0–34.0)
MCHC: 33.6 g/dL (ref 32.0–36.0)
MCV: 91.6 fL (ref 80.0–100.0)
Platelets: 411 10*3/uL (ref 150–440)
RBC: 4.78 MIL/uL (ref 4.40–5.90)
RDW: 13.4 % (ref 11.5–14.5)
WBC: 7.7 10*3/uL (ref 3.8–10.6)

## 2016-12-08 LAB — URINALYSIS, COMPLETE (UACMP) WITH MICROSCOPIC
BACTERIA UA: NONE SEEN
BILIRUBIN URINE: NEGATIVE
Glucose, UA: NEGATIVE mg/dL
HGB URINE DIPSTICK: NEGATIVE
KETONES UR: NEGATIVE mg/dL
LEUKOCYTES UA: NEGATIVE
NITRITE: NEGATIVE
PROTEIN: NEGATIVE mg/dL
RBC / HPF: NONE SEEN RBC/hpf (ref 0–5)
SQUAMOUS EPITHELIAL / LPF: NONE SEEN
Specific Gravity, Urine: 1.019 (ref 1.005–1.030)
pH: 5 (ref 5.0–8.0)

## 2016-12-08 MED ORDER — BUTALBITAL-APAP-CAFFEINE 50-325-40 MG PO TABS: 1.0000 | ORAL_TABLET | Freq: Four times a day (QID) | ORAL | 0 refills | Status: DC | PRN

## 2016-12-08 MED ORDER — BUTALBITAL-APAP-CAFFEINE 50-325-40 MG PO TABS
2.0000 | ORAL_TABLET | Freq: Once | ORAL | Status: AC
  Administered 2016-12-08: 2 via ORAL
  Filled 2016-12-08: qty 2

## 2016-12-08 NOTE — ED Triage Notes (Addendum)
Pt presents to ED c/o of head injury post syncopal episode when he was walking to  The toilet, fell into the bath tub Friday night. " I think I passed out ". " I feel worse, tired, sleepy ; just out of it".

## 2016-12-08 NOTE — ED Provider Notes (Signed)
[facility] Emergency Department Provider Note  Time seen: 3:46 PM  I have reviewed the triage vital signs and the nursing notes.   HISTORY  Chief Complaint Head Injury and Loss of Consciousness    HPI Jeremy Delgado is a 46 y.o. male with a past medical history of substance use, neuropathy, presents the emergency department with generalized weakness and a feeling of being off balance. According to the patient the patient fell 2 days ago falling backwards hitting his head. States possible brief LOC, but he is not sure. He states since that time he just felt lightheaded and generalized weak. Denies any focal deficits.  Past Medical History:  Diagnosis Date  . Addiction to drug ([place]) 04/06/2015  . ANXIETY 03/11/2008   Qualifier: Diagnosis of  By: Hassell Done FNP, Tori Milks     . Arthritis   . BACK PAIN 02/17/2007   Qualifier: Diagnosis of  By: Silvio Pate MD, [facility]aird Cancer   . Chronic pain   . Colitis    self  . DEPRESSION 11/05/2003   Qualifier: Diagnosis of  By: Hassell Done FNP, Tori Milks    . GERD 12/29/2008   Qualifier: Diagnosis of  By: Hassell Done FNP, Tori Milks    . H/O arthritis 02/20/2015  . H/O: hypothyroidism 02/20/2015  . HYPERCHOLESTEROLEMIA 03/11/2008   Qualifier: Diagnosis of  By: Hassell Done FNP, Tori Milks    . Hypertension   . PAIN IN JOINT PELVIC REGION AND THIGH 03/11/2008   Qualifier: Diagnosis of  By: Hassell Done FNP, Tori Milks    . Renal disorder   . Sciatica   . TOBACCO ABUSE 07/15/2008   Qualifier: Diagnosis of  By: Hassell Done FNP, Tori Milks      Patient Active Problem List   Diagnosis Date Noted  . Addiction to drug ([place]) 04/06/2015  . Combinations of drug dependence excluding opioid type drug ([place]) 04/06/2015  . Pain 04/06/2015  . Episodic paroxysmal anxiety disorder 04/06/2015  . Noninfectious gastroenteritis and colitis 04/06/2015  . H/O arthritis 02/20/2015  . H/O elevated lipids 02/20/2015  . Backache 02/20/2015  . H/O: hypothyroidism 02/20/2015  . Depression,  major, recurrent, moderate ([place]) 02/20/2015  . PHARYNGITIS 01/31/2009  . COUGH 01/31/2009  . GERD 12/29/2008  . TOBACCO ABUSE 07/15/2008  . HYPERCHOLESTEROLEMIA 03/11/2008  . ANXIETY 03/11/2008  . PAIN IN JOINT PELVIC REGION AND THIGH 03/11/2008  . FLANK PAIN, LEFT 03/11/2008  . BACK PAIN 02/17/2007  . DEPRESSION 11/05/2003    Past Surgical History:  Procedure Laterality Date  . HEMORRHOID SURGERY      Prior to Admission medications   Medication Sig Start Date End Date Taking? Authorizing Provider  cephALEXin (KEFLEX) 500 MG capsule Take 1 capsule (500 mg total) by mouth 3 (three) times daily. Patient not taking: Reported on 08/05/2016 07/10/16   Margarita Mail, PA-C  cloNIDine (CATAPRES) 0.1 MG tablet Take 0.1 mg by mouth 2 (two) times daily. 07/26/16   Historical Provider, MD  FLUoxetine (PROZAC) 40 MG capsule Take 1 capsule (40 mg total) by mouth daily. 07/06/15   Rainey Pines, MD  gabapentin (NEURONTIN) 600 MG tablet Take 600 mg by mouth 2 (two) times daily. 07/26/16   Historical Provider, MD  levothyroxine (SYNTHROID, LEVOTHROID) 50 MCG tablet Take 50 mcg by mouth every morning.  03/13/15   Historical Provider, MD  Melatonin 3 MG CAPS Take 1 capsule by mouth at bedtime as needed (sleep).     Historical Provider, MD  naproxen (EC NAPROSYN) 500 MG EC tablet Take 1 tablet (500 mg total) by mouth  2 (two) times daily with a meal. Patient not taking: Reported on 08/05/2016 02/22/16   Alita Chyle Bacon Menshew, PA-C  omeprazole (PRILOSEC) 20 MG capsule Take 20 mg by mouth daily.    Historical Provider, MD  oxyCODONE-acetaminophen (PERCOCET) 5-325 MG tablet Take 1-2 tablets by mouth every 6 (six) hours as needed. Patient not taking: Reported on 08/05/2016 07/10/16   Margarita Mail, PA-C  pravastatin (PRAVACHOL) 40 MG tablet Take 40 mg by mouth every evening.    Historical Provider, MD    Allergies  Allergen Reactions  . Bee Pollen Anaphylaxis  . Codeine Itching and Nausea Only    "not bad nausea"   . Prednisone Other (See Comments)    Blood pressure goes up     Family History  Problem Relation Age of Onset  . Depression Mother   . Hypertension Father   . Heart failure Father   . Diabetes Paternal Grandfather     Social History Social History  Substance Use Topics  . Smoking status: Current Every Day Smoker    Packs/day: 0.50    Types: Cigarettes    Start date: 07/05/1990  . Smokeless tobacco: Never Used     Comment: pt currently uses a E-cig  . Alcohol use 0.0 oz/week     Comment: social     Review of Systems Constitutional: Negative for fever. Cardiovascular: Negative for chest pain. Respiratory: Negative for shortness of breath. Gastrointestinal: Negative for abdominal pain Neurological: Mild headache. Feels lightheaded. Denies focal weakness or numbness. States chronic left lower extremity nerve pain. 10-point ROS otherwise negative.  ____________________________________________   PHYSICAL EXAM:  VITAL SIGNS: ED Triage Vitals  Enc Vitals Group     BP 12/08/16 1528 122/83     Pulse Rate 12/08/16 1528 68     Resp 12/08/16 1528 20     Temp 12/08/16 1528 98.4 F (36.9 C)     Temp Source 12/08/16 1528 Oral     SpO2 12/08/16 1528 97 %     Weight 12/08/16 1528 180 lb (81.6 kg)     Height 12/08/16 1528 6' (1.829 m)     Head Circumference --      Peak Flow --      Pain Score 12/08/16 1529 7     Pain Loc --      Pain Edu? --      Excl. in [place]? --     Constitutional: Alert and oriented. Well appearing and in no distress. Eyes: Normal exam ENT   Head: Normocephalic and atraumatic.   Mouth/Throat: Mucous membranes are moist. Cardiovascular: Normal rate, regular rhythm. No murmur Respiratory: Normal respiratory effort without tachypnea nor retractions. Breath sounds are clear Gastrointestinal: Soft and nontender. No distention.   Musculoskeletal: Nontender with normal range of motion in all extremities.  Neurologic:  Normal speech and language. No  gross focal neurologic deficits. Equal grip strengths. No pronator drift. Intact cranial nerves. Skin:  Skin is warm, dry and intact.  Psychiatric: Mood and affect are normal.  ____________________________________________    EKG  EKG reviewed and interpreted by myself shows normal sinus rhythm at 66 bpm, narrow QRS, normal axis, normal intervals, no ST changes.  ____________________________________________    RADIOLOGY  CT head negative  ____________________________________________   INITIAL IMPRESSION / ASSESSMENT AND PLAN / ED COURSE  Pertinent labs & imaging results that were available during my care of the patient were reviewed by me and considered in my medical decision making (see chart for details).  Patient  presents the emergency department after a head injury. States he fell backwards hitting his head. Possible LOC. Since that time he has felt lightheaded dizzy and off-balance. Overall patient appears well. Normal neurological exam said some weakness and left lower extremity she states is chronic. We will obtain a CT scan head as well as basic labs. We will dose Fioricet for headache.  CT scan of head is negative. Labs are reassuring. We will discharge the patient home with normal concussion precautions. Patient agreeable.  ____________________________________________   FINAL CLINICAL IMPRESSION(S) / ED DIAGNOSES  Postconcussive syndrome    Harvest Dark, MD 12/08/16 [phone]

## 2016-12-19 DIAGNOSIS — M9901 Segmental and somatic dysfunction of cervical region: Secondary | ICD-10-CM | POA: Diagnosis not present

## 2016-12-19 DIAGNOSIS — M4602 Spinal enthesopathy, cervical region: Secondary | ICD-10-CM | POA: Diagnosis not present

## 2016-12-19 DIAGNOSIS — M436 Torticollis: Secondary | ICD-10-CM | POA: Diagnosis not present

## 2016-12-23 DIAGNOSIS — F329 Major depressive disorder, single episode, unspecified: Secondary | ICD-10-CM | POA: Diagnosis not present

## 2016-12-23 DIAGNOSIS — S060X0D Concussion without loss of consciousness, subsequent encounter: Secondary | ICD-10-CM | POA: Diagnosis not present

## 2016-12-23 DIAGNOSIS — M436 Torticollis: Secondary | ICD-10-CM | POA: Diagnosis not present

## 2016-12-23 DIAGNOSIS — R634 Abnormal weight loss: Secondary | ICD-10-CM | POA: Diagnosis not present

## 2016-12-23 DIAGNOSIS — F17210 Nicotine dependence, cigarettes, uncomplicated: Secondary | ICD-10-CM | POA: Diagnosis not present

## 2016-12-23 DIAGNOSIS — G4489 Other headache syndrome: Secondary | ICD-10-CM | POA: Diagnosis not present

## 2016-12-23 DIAGNOSIS — M5440 Lumbago with sciatica, unspecified side: Secondary | ICD-10-CM | POA: Diagnosis not present

## 2016-12-23 DIAGNOSIS — E039 Hypothyroidism, unspecified: Secondary | ICD-10-CM | POA: Diagnosis not present

## 2016-12-23 DIAGNOSIS — M9901 Segmental and somatic dysfunction of cervical region: Secondary | ICD-10-CM | POA: Diagnosis not present

## 2016-12-23 DIAGNOSIS — M4602 Spinal enthesopathy, cervical region: Secondary | ICD-10-CM | POA: Diagnosis not present

## 2016-12-23 DIAGNOSIS — I10 Essential (primary) hypertension: Secondary | ICD-10-CM | POA: Diagnosis not present

## 2016-12-24 DIAGNOSIS — M436 Torticollis: Secondary | ICD-10-CM | POA: Diagnosis not present

## 2016-12-24 DIAGNOSIS — M9901 Segmental and somatic dysfunction of cervical region: Secondary | ICD-10-CM | POA: Diagnosis not present

## 2016-12-24 DIAGNOSIS — M4602 Spinal enthesopathy, cervical region: Secondary | ICD-10-CM | POA: Diagnosis not present

## 2016-12-27 DIAGNOSIS — M4602 Spinal enthesopathy, cervical region: Secondary | ICD-10-CM | POA: Diagnosis not present

## 2016-12-27 DIAGNOSIS — M436 Torticollis: Secondary | ICD-10-CM | POA: Diagnosis not present

## 2016-12-27 DIAGNOSIS — M9901 Segmental and somatic dysfunction of cervical region: Secondary | ICD-10-CM | POA: Diagnosis not present

## 2016-12-30 DIAGNOSIS — M436 Torticollis: Secondary | ICD-10-CM | POA: Diagnosis not present

## 2016-12-30 DIAGNOSIS — M4602 Spinal enthesopathy, cervical region: Secondary | ICD-10-CM | POA: Diagnosis not present

## 2016-12-30 DIAGNOSIS — M9901 Segmental and somatic dysfunction of cervical region: Secondary | ICD-10-CM | POA: Diagnosis not present

## 2017-01-01 DIAGNOSIS — M4602 Spinal enthesopathy, cervical region: Secondary | ICD-10-CM | POA: Diagnosis not present

## 2017-01-01 DIAGNOSIS — M9901 Segmental and somatic dysfunction of cervical region: Secondary | ICD-10-CM | POA: Diagnosis not present

## 2017-01-01 DIAGNOSIS — M436 Torticollis: Secondary | ICD-10-CM | POA: Diagnosis not present

## 2017-01-03 DIAGNOSIS — M436 Torticollis: Secondary | ICD-10-CM | POA: Diagnosis not present

## 2017-01-03 DIAGNOSIS — M9901 Segmental and somatic dysfunction of cervical region: Secondary | ICD-10-CM | POA: Diagnosis not present

## 2017-01-03 DIAGNOSIS — M4602 Spinal enthesopathy, cervical region: Secondary | ICD-10-CM | POA: Diagnosis not present

## 2017-01-06 DIAGNOSIS — M9901 Segmental and somatic dysfunction of cervical region: Secondary | ICD-10-CM | POA: Diagnosis not present

## 2017-01-06 DIAGNOSIS — M4602 Spinal enthesopathy, cervical region: Secondary | ICD-10-CM | POA: Diagnosis not present

## 2017-01-06 DIAGNOSIS — M436 Torticollis: Secondary | ICD-10-CM | POA: Diagnosis not present

## 2017-02-04 DIAGNOSIS — E782 Mixed hyperlipidemia: Secondary | ICD-10-CM | POA: Diagnosis not present

## 2017-02-04 DIAGNOSIS — S2341XA Sprain of ribs, initial encounter: Secondary | ICD-10-CM | POA: Diagnosis not present

## 2017-02-04 DIAGNOSIS — F17210 Nicotine dependence, cigarettes, uncomplicated: Secondary | ICD-10-CM | POA: Diagnosis not present

## 2017-02-04 DIAGNOSIS — E039 Hypothyroidism, unspecified: Secondary | ICD-10-CM | POA: Diagnosis not present

## 2017-02-04 DIAGNOSIS — I10 Essential (primary) hypertension: Secondary | ICD-10-CM | POA: Diagnosis not present

## 2017-02-04 DIAGNOSIS — K219 Gastro-esophageal reflux disease without esophagitis: Secondary | ICD-10-CM | POA: Diagnosis not present

## 2017-02-04 DIAGNOSIS — M542 Cervicalgia: Secondary | ICD-10-CM | POA: Diagnosis not present

## 2017-02-04 DIAGNOSIS — R634 Abnormal weight loss: Secondary | ICD-10-CM | POA: Diagnosis not present

## 2017-02-04 DIAGNOSIS — R7301 Impaired fasting glucose: Secondary | ICD-10-CM | POA: Diagnosis not present

## 2017-02-11 DIAGNOSIS — G894 Chronic pain syndrome: Secondary | ICD-10-CM | POA: Diagnosis not present

## 2017-02-11 DIAGNOSIS — M545 Low back pain: Secondary | ICD-10-CM | POA: Diagnosis not present

## 2017-02-11 DIAGNOSIS — M5137 Other intervertebral disc degeneration, lumbosacral region: Secondary | ICD-10-CM | POA: Diagnosis not present

## 2017-02-11 DIAGNOSIS — M5136 Other intervertebral disc degeneration, lumbar region: Secondary | ICD-10-CM | POA: Diagnosis not present

## 2017-02-19 DIAGNOSIS — R9082 White matter disease, unspecified: Secondary | ICD-10-CM | POA: Diagnosis not present

## 2017-02-19 DIAGNOSIS — G43019 Migraine without aura, intractable, without status migrainosus: Secondary | ICD-10-CM | POA: Insufficient documentation

## 2017-02-25 DIAGNOSIS — M79661 Pain in right lower leg: Secondary | ICD-10-CM | POA: Diagnosis not present

## 2017-02-25 DIAGNOSIS — G894 Chronic pain syndrome: Secondary | ICD-10-CM | POA: Diagnosis not present

## 2017-02-25 DIAGNOSIS — M79662 Pain in left lower leg: Secondary | ICD-10-CM | POA: Diagnosis not present

## 2017-02-25 DIAGNOSIS — M545 Low back pain: Secondary | ICD-10-CM | POA: Diagnosis not present

## 2017-02-25 DIAGNOSIS — Z79891 Long term (current) use of opiate analgesic: Secondary | ICD-10-CM | POA: Diagnosis not present

## 2017-03-04 ENCOUNTER — Other Ambulatory Visit: Payer: Self-pay | Admitting: Anesthesiology

## 2017-03-04 DIAGNOSIS — M545 Low back pain: Secondary | ICD-10-CM

## 2017-03-18 ENCOUNTER — Ambulatory Visit
Admission: RE | Admit: 2017-03-18 | Discharge: 2017-03-18 | Disposition: A | Discharge status: Home / Self Care | Payer: PPO | Source: Ambulatory Visit | Attending: Anesthesiology | Admitting: Anesthesiology

## 2017-03-18 DIAGNOSIS — M5136 Other intervertebral disc degeneration, lumbar region: Secondary | ICD-10-CM | POA: Diagnosis not present

## 2017-03-18 DIAGNOSIS — M545 Low back pain: Secondary | ICD-10-CM | POA: Diagnosis not present

## 2017-03-25 DIAGNOSIS — G894 Chronic pain syndrome: Secondary | ICD-10-CM | POA: Diagnosis not present

## 2017-03-25 DIAGNOSIS — Z79891 Long term (current) use of opiate analgesic: Secondary | ICD-10-CM | POA: Diagnosis not present

## 2017-03-25 DIAGNOSIS — G890 Central pain syndrome: Secondary | ICD-10-CM | POA: Diagnosis not present

## 2017-03-25 DIAGNOSIS — M79662 Pain in left lower leg: Secondary | ICD-10-CM | POA: Diagnosis not present

## 2017-03-25 DIAGNOSIS — M545 Low back pain: Secondary | ICD-10-CM | POA: Diagnosis not present

## 2017-03-25 DIAGNOSIS — M79661 Pain in right lower leg: Secondary | ICD-10-CM | POA: Diagnosis not present

## 2017-04-23 DIAGNOSIS — G890 Central pain syndrome: Secondary | ICD-10-CM | POA: Diagnosis not present

## 2017-04-23 DIAGNOSIS — M79661 Pain in right lower leg: Secondary | ICD-10-CM | POA: Diagnosis not present

## 2017-04-23 DIAGNOSIS — M79662 Pain in left lower leg: Secondary | ICD-10-CM | POA: Diagnosis not present

## 2017-04-23 DIAGNOSIS — Z79891 Long term (current) use of opiate analgesic: Secondary | ICD-10-CM | POA: Diagnosis not present

## 2017-05-13 DIAGNOSIS — M5416 Radiculopathy, lumbar region: Secondary | ICD-10-CM | POA: Diagnosis not present

## 2017-05-13 DIAGNOSIS — E782 Mixed hyperlipidemia: Secondary | ICD-10-CM | POA: Diagnosis not present

## 2017-05-13 DIAGNOSIS — F17210 Nicotine dependence, cigarettes, uncomplicated: Secondary | ICD-10-CM | POA: Diagnosis not present

## 2017-05-13 DIAGNOSIS — G4489 Other headache syndrome: Secondary | ICD-10-CM | POA: Diagnosis not present

## 2017-05-13 DIAGNOSIS — I10 Essential (primary) hypertension: Secondary | ICD-10-CM | POA: Diagnosis not present

## 2017-05-13 DIAGNOSIS — E039 Hypothyroidism, unspecified: Secondary | ICD-10-CM | POA: Diagnosis not present

## 2017-05-13 DIAGNOSIS — M5481 Occipital neuralgia: Secondary | ICD-10-CM | POA: Diagnosis not present

## 2017-05-13 DIAGNOSIS — G479 Sleep disorder, unspecified: Secondary | ICD-10-CM | POA: Diagnosis not present

## 2017-05-13 DIAGNOSIS — R413 Other amnesia: Secondary | ICD-10-CM | POA: Diagnosis not present

## 2017-05-13 DIAGNOSIS — K219 Gastro-esophageal reflux disease without esophagitis: Secondary | ICD-10-CM | POA: Diagnosis not present

## 2017-05-13 DIAGNOSIS — G43019 Migraine without aura, intractable, without status migrainosus: Secondary | ICD-10-CM | POA: Diagnosis not present

## 2017-05-15 DIAGNOSIS — G43019 Migraine without aura, intractable, without status migrainosus: Secondary | ICD-10-CM | POA: Diagnosis not present

## 2017-05-15 DIAGNOSIS — M5416 Radiculopathy, lumbar region: Secondary | ICD-10-CM | POA: Diagnosis not present

## 2017-05-15 DIAGNOSIS — R413 Other amnesia: Secondary | ICD-10-CM | POA: Diagnosis not present

## 2017-05-15 DIAGNOSIS — G479 Sleep disorder, unspecified: Secondary | ICD-10-CM | POA: Diagnosis not present

## 2017-05-15 DIAGNOSIS — M5481 Occipital neuralgia: Secondary | ICD-10-CM | POA: Diagnosis not present

## 2017-05-21 DIAGNOSIS — M79661 Pain in right lower leg: Secondary | ICD-10-CM | POA: Diagnosis not present

## 2017-05-21 DIAGNOSIS — G894 Chronic pain syndrome: Secondary | ICD-10-CM | POA: Diagnosis not present

## 2017-05-21 DIAGNOSIS — M79605 Pain in left leg: Secondary | ICD-10-CM | POA: Diagnosis not present

## 2017-05-21 DIAGNOSIS — M79672 Pain in left foot: Secondary | ICD-10-CM | POA: Diagnosis not present

## 2017-05-21 DIAGNOSIS — M79662 Pain in left lower leg: Secondary | ICD-10-CM | POA: Diagnosis not present

## 2017-05-21 DIAGNOSIS — M79675 Pain in left toe(s): Secondary | ICD-10-CM | POA: Diagnosis not present

## 2017-05-21 DIAGNOSIS — M79604 Pain in right leg: Secondary | ICD-10-CM | POA: Diagnosis not present

## 2017-05-21 DIAGNOSIS — M545 Low back pain: Secondary | ICD-10-CM | POA: Diagnosis not present

## 2017-05-28 ENCOUNTER — Other Ambulatory Visit: Payer: Self-pay | Admitting: Nurse Practitioner

## 2017-05-28 DIAGNOSIS — R413 Other amnesia: Secondary | ICD-10-CM

## 2017-06-06 ENCOUNTER — Ambulatory Visit
Admission: RE | Admit: 2017-06-06 | Discharge: 2017-06-06 | Disposition: A | Discharge status: Home / Self Care | Payer: PPO | Source: Ambulatory Visit | Attending: Nurse Practitioner | Admitting: Nurse Practitioner

## 2017-06-06 DIAGNOSIS — R413 Other amnesia: Secondary | ICD-10-CM | POA: Insufficient documentation

## 2017-06-06 DIAGNOSIS — R51 Headache: Secondary | ICD-10-CM | POA: Diagnosis not present

## 2017-06-06 DIAGNOSIS — S0990XA Unspecified injury of head, initial encounter: Secondary | ICD-10-CM | POA: Diagnosis not present

## 2017-06-06 LAB — POCT I-STAT CREATININE: CREATININE: 1 mg/dL (ref 0.61–1.24)

## 2017-06-06 MED ORDER — GADOBENATE DIMEGLUMINE 529 MG/ML IV SOLN
20.0000 mL | Freq: Once | INTRAVENOUS | Status: AC | PRN
  Administered 2017-06-06: 17 mL via INTRAVENOUS

## 2017-06-08 ENCOUNTER — Emergency Department: Payer: PPO

## 2017-06-08 ENCOUNTER — Emergency Department
Admission: EM | Admit: 2017-06-08 | Discharge: 2017-06-08 | Disposition: A | Discharge status: Home / Self Care | Payer: PPO | Attending: Emergency Medicine | Admitting: Emergency Medicine

## 2017-06-08 DIAGNOSIS — R52 Pain, unspecified: Secondary | ICD-10-CM

## 2017-06-08 DIAGNOSIS — R609 Edema, unspecified: Secondary | ICD-10-CM

## 2017-06-08 DIAGNOSIS — F17210 Nicotine dependence, cigarettes, uncomplicated: Secondary | ICD-10-CM | POA: Diagnosis not present

## 2017-06-08 DIAGNOSIS — R2231 Localized swelling, mass and lump, right upper limb: Secondary | ICD-10-CM | POA: Diagnosis not present

## 2017-06-08 DIAGNOSIS — Z79899 Other long term (current) drug therapy: Secondary | ICD-10-CM | POA: Insufficient documentation

## 2017-06-08 DIAGNOSIS — I82611 Acute embolism and thrombosis of superficial veins of right upper extremity: Secondary | ICD-10-CM | POA: Diagnosis not present

## 2017-06-08 DIAGNOSIS — M7989 Other specified soft tissue disorders: Secondary | ICD-10-CM | POA: Diagnosis not present

## 2017-06-08 DIAGNOSIS — M79601 Pain in right arm: Secondary | ICD-10-CM | POA: Diagnosis present

## 2017-06-08 DIAGNOSIS — M79621 Pain in right upper arm: Secondary | ICD-10-CM | POA: Diagnosis not present

## 2017-06-08 DIAGNOSIS — I808 Phlebitis and thrombophlebitis of other sites: Secondary | ICD-10-CM

## 2017-06-08 LAB — CBC WITH DIFFERENTIAL/PLATELET
BASOS PCT: 1 %
Basophils Absolute: 0 10*3/uL (ref 0–0.1)
EOS ABS: 0.4 10*3/uL (ref 0–0.7)
Eosinophils Relative: 6 %
HEMATOCRIT: 42.5 % (ref 40.0–52.0)
HEMOGLOBIN: 14.4 g/dL (ref 13.0–18.0)
LYMPHS ABS: 1.5 10*3/uL (ref 1.0–3.6)
Lymphocytes Relative: 25 %
MCH: 30.5 pg (ref 26.0–34.0)
MCHC: 34 g/dL (ref 32.0–36.0)
MCV: 89.9 fL (ref 80.0–100.0)
MONOS PCT: 10 %
Monocytes Absolute: 0.6 10*3/uL (ref 0.2–1.0)
NEUTROS ABS: 3.6 10*3/uL (ref 1.4–6.5)
NEUTROS PCT: 58 %
Platelets: 257 10*3/uL (ref 150–440)
RBC: 4.73 MIL/uL (ref 4.40–5.90)
RDW: 13.6 % (ref 11.5–14.5)
WBC: 6.1 10*3/uL (ref 3.8–10.6)

## 2017-06-08 LAB — COMPREHENSIVE METABOLIC PANEL
ALBUMIN: 3.5 g/dL (ref 3.5–5.0)
ALK PHOS: 64 U/L (ref 38–126)
ALT: 13 U/L — AB (ref 17–63)
ANION GAP: 5 (ref 5–15)
AST: 17 U/L (ref 15–41)
BILIRUBIN TOTAL: 0.6 mg/dL (ref 0.3–1.2)
BUN: 17 mg/dL (ref 6–20)
CALCIUM: 8.9 mg/dL (ref 8.9–10.3)
CO2: 23 mmol/L (ref 22–32)
CREATININE: 0.74 mg/dL (ref 0.61–1.24)
Chloride: 107 mmol/L (ref 101–111)
GFR calc Af Amer: >60 mL/min (ref >60)
GFR calc non Af Amer: >60 mL/min (ref >60)
GLUCOSE: 104 mg/dL — AB (ref 65–99)
Potassium: 4.2 mmol/L (ref 3.5–5.1)
SODIUM: 135 mmol/L (ref 135–145)
TOTAL PROTEIN: 6.6 g/dL (ref 6.5–8.1)

## 2017-06-08 MED ORDER — INDOMETHACIN 25 MG PO CAPS
25.0000 mg | ORAL_CAPSULE | Freq: Once | ORAL | Status: AC
  Administered 2017-06-08: 25 mg via ORAL
  Filled 2017-06-08: qty 1

## 2017-06-08 MED ORDER — INDOMETHACIN 25 MG PO CAPS: 25.0000 mg | ORAL_CAPSULE | Freq: Three times a day (TID) | ORAL | 0 refills | Status: DC

## 2017-06-08 NOTE — Discharge Instructions (Signed)
Call tomorrow to make an appointment for follow-up with your primary care provider. Begin taking Indocin 25 mg 3 times a day with food. Use moist warm compresses to your right arm 4-5 times per day. Return to the emergency room if any worsening of your symptoms.

## 2017-06-08 NOTE — ED Provider Notes (Signed)
[facility] Emergency Department Provider Note  ____________________________________________   First MD Initiated Contact with Patient 06/08/17 1351     (approximate)  I have reviewed the triage vital signs and the nursing notes.   HISTORY  Chief Complaint Extremity Weakness   HPI Jeremy Delgado is a 46 y.o. male is here complaining of right arm pain. Patient states that he was here for an MRI on Friday. Patient had an IV started in the [facility] area and has noticed. Area has become very tender and swollen. The MRI was unrelated to patient's right arm pain. She denies any fever or chills. Currently he rates pain as 6/10.   Past Medical History:  Diagnosis Date  . Addiction to drug ([place]) 04/06/2015  . ANXIETY 03/11/2008   Qualifier: Diagnosis of  By: Hassell Done FNP, Tori Milks     . Arthritis   . BACK PAIN 02/17/2007   Qualifier: Diagnosis of  By: Silvio Pate MD, Baird Cancer   . Chronic pain   . Colitis    self  . DEPRESSION 11/05/2003   Qualifier: Diagnosis of  By: Hassell Done FNP, Tori Milks    . GERD 12/29/2008   Qualifier: Diagnosis of  By: Hassell Done FNP, Tori Milks    . H/O arthritis 02/20/2015  . H/O: hypothyroidism 02/20/2015  . HYPERCHOLESTEROLEMIA 03/11/2008   Qualifier: Diagnosis of  By: Hassell Done FNP, Tori Milks    . Hypertension   . PAIN IN JOINT PELVIC REGION AND THIGH 03/11/2008   Qualifier: Diagnosis of  By: Hassell Done FNP, Tori Milks    . Renal disorder   . Sciatica   . TOBACCO ABUSE 07/15/2008   Qualifier: Diagnosis of  By: Hassell Done FNP, Tori Milks      Patient Active Problem List   Diagnosis Date Noted  . Addiction to drug ([place]) 04/06/2015  . Combinations of drug dependence excluding opioid type drug ([place]) 04/06/2015  . Pain 04/06/2015  . Episodic paroxysmal anxiety disorder 04/06/2015  . Noninfectious gastroenteritis and colitis 04/06/2015  . H/O arthritis 02/20/2015  . H/O elevated lipids 02/20/2015  . Backache 02/20/2015  . H/O: hypothyroidism 02/20/2015  . Depression,  major, recurrent, moderate ([place]) 02/20/2015  . PHARYNGITIS 01/31/2009  . COUGH 01/31/2009  . GERD 12/29/2008  . TOBACCO ABUSE 07/15/2008  . HYPERCHOLESTEROLEMIA 03/11/2008  . ANXIETY 03/11/2008  . PAIN IN JOINT PELVIC REGION AND THIGH 03/11/2008  . FLANK PAIN, LEFT 03/11/2008  . BACK PAIN 02/17/2007  . DEPRESSION 11/05/2003    Past Surgical History:  Procedure Laterality Date  . HEMORRHOID SURGERY      Prior to Admission medications   Medication Sig Start Date End Date Taking? Authorizing Provider  cloNIDine (CATAPRES) 0.1 MG tablet Take 0.1 mg by mouth 2 (two) times daily. 07/26/16   [provider]  FLUoxetine (PROZAC) 40 MG capsule Take 1 capsule (40 mg total) by mouth daily. 07/06/15   Rainey Pines, MD  gabapentin (NEURONTIN) 600 MG tablet Take 600 mg by mouth 2 (two) times daily. 07/26/16   [provider]  indomethacin (INDOCIN) 25 MG capsule Take 1 capsule (25 mg total) by mouth 3 (three) times daily with meals. 06/08/17   Johnn Hai, PA-C  levothyroxine (SYNTHROID, LEVOTHROID) 50 MCG tablet Take 50 mcg by mouth every morning.  03/13/15   [provider]  Melatonin 3 MG CAPS Take 1 capsule by mouth at bedtime as needed (sleep).     [provider]  omeprazole (PRILOSEC) 20 MG capsule Take 20 mg by mouth daily.  [provider]  oxyCODONE-acetaminophen (PERCOCET) 5-325 MG tablet Take 1-2 tablets by mouth every 6 (six) hours as needed. Patient not taking: Reported on 08/05/2016 07/10/16   Margarita Mail, PA-C  pravastatin (PRAVACHOL) 40 MG tablet Take 40 mg by mouth every evening.    [provider]    Allergies Bee pollen; Codeine; and Prednisone  Family History  Problem Relation Age of Onset  . Depression Mother   . Hypertension Father   . Heart failure Father   . Diabetes Paternal Grandfather     Social History Social History  Substance Use Topics  . Smoking status: Current Every Day Smoker    Packs/day: 0.50      Types: Cigarettes    Start date: 07/05/1990  . Smokeless tobacco: Never Used     Comment: pt currently uses a E-cig  . Alcohol use 0.0 oz/week     Comment: social     Review of Systems Constitutional: No fever/chills Cardiovascular: Denies chest pain. Respiratory: Denies shortness of breath. Gastrointestinal: No abdominal pain.  No nausea, no vomiting.  Musculoskeletal: Positive for right arm pain. Skin: Positive for redness right arm. Neurological: Negative for headaches, focal weakness or numbness.   ____________________________________________   PHYSICAL EXAM:  VITAL SIGNS: ED Triage Vitals  Enc Vitals Group     BP 06/08/17 1311 (!) 131/92     Pulse Rate 06/08/17 1311 75     Resp 06/08/17 1311 18     Temp 06/08/17 1311 98.8 F (37.1 C)     Temp Source 06/08/17 1311 Oral     SpO2 06/08/17 1311 98 %     Weight 06/08/17 1312 180 lb (81.6 kg)     Height 06/08/17 1312 6' (1.829 m)     Head Circumference --      Peak Flow --      Pain Score 06/08/17 1310 6     Pain Loc --      Pain Edu? --      Excl. in [place]? --     Constitutional: Alert and oriented. Well appearing and in no acute distress. Eyes: Conjunctivae are normal.  Head: Atraumatic. Neck: No stridor.   Cardiovascular: Normal rate, regular rhythm. Grossly normal heart sounds.  Good peripheral circulation. Respiratory: Normal respiratory effort.  No retractions. Lungs CTAB. Musculoskeletal: Right arm AC area is moderately tender with some mild soft tissue swelling. Medial aspect is erythematous, slightly warm and extremely tender to palpation. Pulses distally are intact capillary refills less than 3 seconds. No skin opening wounds are noted. Neurologic:  Normal speech and language. No gross focal neurologic deficits are appreciated.  Skin:  Skin is warm, dry and intact. As noted above. Psychiatric: Mood and affect are normal. Speech and behavior are normal.  ____________________________________________    LABS (all labs ordered are listed, but only abnormal results are displayed)  Labs Reviewed  COMPREHENSIVE METABOLIC PANEL - Abnormal; Notable for the following:       Result Value   Glucose, Bld 104 (*)    ALT 13 (*)    All other components within normal limits  CBC WITH DIFFERENTIAL/PLATELET    RADIOLOGY  US Venous Img Upper Uni Right  Result Date: 06/08/2017 CLINICAL DATA:  46 y/o M; recent IV in the arm. Pain and swelling since this morning. EXAM: RIGHT UPPER EXTREMITY VENOUS DOPPLER ULTRASOUND TECHNIQUE: Gray-scale sonography with graded compression, as well as color Doppler and duplex ultrasound were performed to evaluate the upper extremity deep venous system from  the level of the subclavian vein and including the jugular, axillary, basilic, radial, ulnar and upper cephalic vein. Spectral Doppler was utilized to evaluate flow at rest and with distal augmentation maneuvers. COMPARISON:  None. FINDINGS: Contralateral Subclavian Vein: Respiratory phasicity is normal and symmetric with the symptomatic side. No evidence of thrombus. Normal compressibility. Internal Jugular Vein: No evidence of thrombus. Normal compressibility, respiratory phasicity and response to augmentation. Subclavian Vein: No evidence of thrombus. Normal compressibility, respiratory phasicity and response to augmentation. Axillary Vein: No evidence of thrombus. Normal compressibility, respiratory phasicity and response to augmentation. Cephalic Vein: No evidence of thrombus. Normal compressibility, respiratory phasicity and response to augmentation. Basilic Vein: No evidence of thrombus. Normal compressibility, respiratory phasicity and response to augmentation. Brachial Veins: Occlusive noncompressible thrombus within the basilic vein. Radial Veins: No evidence of thrombus. Normal compressibility, respiratory phasicity and response to augmentation. Ulnar Veins: No evidence of thrombus. Normal compressibility, respiratory  phasicity and response to augmentation. Venous Reflux:  None visualized. Other Findings:  None visualized. IMPRESSION: Occlusive thrombus within the basilic vein. No evidence for deep venous thrombosis. Electronically Signed   By: Kristine Garbe M.D.   On: 06/08/2017 16:11    ____________________________________________   PROCEDURES  Procedure(s) performed: None  Procedures  Critical Care performed: No  ____________________________________________   INITIAL IMPRESSION / ASSESSMENT AND PLAN / ED COURSE  Pertinent labs & imaging results that were available during my care of the patient were reviewed by me and considered in my medical decision making (see chart for details).  Patient was made aware that he has thrombophlebitis of his right arm most likely secondary to his IV prior to the MRI done recently. Patient was given Indocin 25 mg by mouth in the department while waiting for the report. He was also given a prescription for indomethacin 25 mg 3 times a day. He was instructed to use warm moist compresses to his arm frequently and to follow-up with his PCP at [facility]. It is noted that patient does have oxycodone at home as he sees a pain management clinic.   ___________________________________________   FINAL CLINICAL IMPRESSION(S) / ED DIAGNOSES  Final diagnoses:  Swelling  Pain  Thrombophlebitis of right arm      NEW MEDICATIONS STARTED DURING THIS VISIT:  Discharge Medication List as of 06/08/2017  4:54 PM    START taking these medications   Details  indomethacin (INDOCIN) 25 MG capsule Take 1 capsule (25 mg total) by mouth 3 (three) times daily with meals., Starting Sun 06/08/2017, Print         Note:  This document was prepared using Dragon voice recognition software and may include unintentional dictation errors.    Johnn Hai, PA-C 06/08/17 1840    Lisa Roca, MD 06/10/17 [phone]

## 2017-06-08 NOTE — ED Triage Notes (Signed)
Pt presents to ED via POV with c/o RIGHT arm and elbow pain and "numbness" since Friday following an MRI. Pt reports pain and swelling to [facility] area, mild redness observed. Pt reports arm pain, numbness, and heaviness, CMS intact. Pt is A&O, in NAD; RR even, regular, and unlabored.

## 2017-06-09 ENCOUNTER — Ambulatory Visit (INDEPENDENT_AMBULATORY_CARE_PROVIDER_SITE_OTHER): Payer: PPO | Admitting: Vascular Surgery

## 2017-06-09 ENCOUNTER — Encounter (INDEPENDENT_AMBULATORY_CARE_PROVIDER_SITE_OTHER): Payer: Self-pay | Admitting: Vascular Surgery

## 2017-06-09 VITALS — BP 133/81 | HR 62 | Resp 17 | Wt 186.0 lb

## 2017-06-09 DIAGNOSIS — K219 Gastro-esophageal reflux disease without esophagitis: Secondary | ICD-10-CM | POA: Diagnosis not present

## 2017-06-09 DIAGNOSIS — E7800 Pure hypercholesterolemia, unspecified: Secondary | ICD-10-CM | POA: Diagnosis not present

## 2017-06-09 DIAGNOSIS — I10 Essential (primary) hypertension: Secondary | ICD-10-CM | POA: Diagnosis not present

## 2017-06-09 DIAGNOSIS — I809 Phlebitis and thrombophlebitis of unspecified site: Secondary | ICD-10-CM | POA: Insufficient documentation

## 2017-06-09 DIAGNOSIS — I808 Phlebitis and thrombophlebitis of other sites: Secondary | ICD-10-CM

## 2017-06-09 DIAGNOSIS — F17210 Nicotine dependence, cigarettes, uncomplicated: Secondary | ICD-10-CM | POA: Diagnosis not present

## 2017-06-09 NOTE — Progress Notes (Signed)
MRN : [id number]  Jeremy Delgado is a 46 y.o. (01/08/1971) male who presents with chief complaint of  Chief Complaint  Patient presents with  . Blind Spot  .  History of Present Illness:The patient is seen for evaluation of symptomatic STP. The patient relates burning and stinging which worsened steadily throughout the course of the day, particularly with dependency. The patient also notes an aching and throbbing pain over the phlebitis, particularly with prolonged dependent positions. The symptoms are significantly improved with elevation.  The event appears to have been incited by an iv placed for contrast for his MRI.  There is no history of DVT or PE. There is no history of ulceration or hemorrhage. The patient denies a significant family history of venous thrombosis.  The patient has not worn graduated compression in the past. At the present time the patient has not been using over-the-counter analgesics. There is no history of prior surgical intervention or sclerotherapy.    Current Meds  Medication Sig  . cloNIDine (CATAPRES) 0.1 MG tablet Take 0.1 mg by mouth 2 (two) times daily.  Marland Kitchen FLUoxetine (PROZAC) 40 MG capsule Take 1 capsule (40 mg total) by mouth daily.  Marland Kitchen gabapentin (NEURONTIN) 600 MG tablet Take 600 mg by mouth 2 (two) times daily.  . indomethacin (INDOCIN) 25 MG capsule Take 1 capsule (25 mg total) by mouth 3 (three) times daily with meals.  Marland Kitchen levothyroxine (SYNTHROID, LEVOTHROID) 50 MCG tablet Take 50 mcg by mouth every morning.   . Melatonin 3 MG CAPS Take 1 capsule by mouth at bedtime as needed (sleep).   Marland Kitchen omeprazole (PRILOSEC) 20 MG capsule Take 20 mg by mouth daily.  Marland Kitchen oxyCODONE-acetaminophen (PERCOCET) 5-325 MG tablet Take 1-2 tablets by mouth every 6 (six) hours as needed.    Past Medical History:  Diagnosis Date  . Addiction to drug ([place]) 04/06/2015  . ANXIETY 03/11/2008   Qualifier: Diagnosis of  By: Hassell Done FNP, Tori Milks     . Arthritis   . BACK PAIN  02/17/2007   Qualifier: Diagnosis of  By: Silvio Pate MD, Baird Cancer   . Chronic pain   . Colitis    self  . DEPRESSION 11/05/2003   Qualifier: Diagnosis of  By: Hassell Done FNP, Tori Milks    . GERD 12/29/2008   Qualifier: Diagnosis of  By: Hassell Done FNP, Tori Milks    . H/O arthritis 02/20/2015  . H/O: hypothyroidism 02/20/2015  . HYPERCHOLESTEROLEMIA 03/11/2008   Qualifier: Diagnosis of  By: Hassell Done FNP, Tori Milks    . Hypertension   . PAIN IN JOINT PELVIC REGION AND THIGH 03/11/2008   Qualifier: Diagnosis of  By: Hassell Done FNP, Tori Milks    . Renal disorder   . Sciatica   . TOBACCO ABUSE 07/15/2008   Qualifier: Diagnosis of  By: Hassell Done FNP, Tori Milks      Past Surgical History:  Procedure Laterality Date  . HEMORRHOID SURGERY      Social History Social History  Substance Use Topics  . Smoking status: Current Every Day Smoker    Packs/day: 0.50    Types: Cigarettes    Start date: 07/05/1990  . Smokeless tobacco: Never Used     Comment: pt currently uses a E-cig  . Alcohol use 0.0 oz/week     Comment: social     Family History Family History  Problem Relation Age of Onset  . Depression Mother   . Hypertension Father   . Heart failure Father   . Diabetes Paternal Grandfather  Allergies  Allergen Reactions  . Bee Pollen Anaphylaxis  . Codeine Itching and Nausea Only    "not bad nausea"  . Prednisone Other (See Comments)    Blood pressure goes up      REVIEW OF SYSTEMS (Negative unless checked)  Constitutional: [] Weight loss  [] Fever  [] Chills Cardiac: [] Chest pain   [] Chest pressure   [] Palpitations   [] Shortness of breath when laying flat   [] Shortness of breath with exertion. Vascular:  [] Pain in legs with walking   [] Pain in legs at rest  [] History of DVT   [] Phlebitis   [] Swelling in legs   [] Varicose veins   [] Non-healing ulcers Pulmonary:   [] Uses home oxygen   [] Productive cough   [] Hemoptysis   [] Wheeze  [] COPD   [] Asthma Neurologic:  [] Dizziness   [] Seizures   [] History of  stroke   [] History of TIA  [] Aphasia   [] Vissual changes   [] Weakness or numbness in arm   [] Weakness or numbness in leg Musculoskeletal:   [] Joint swelling   [] Joint pain   [] Low back pain Hematologic:  [] Easy bruising  [] Easy bleeding   [] Hypercoagulable state   [] Anemic Gastrointestinal:  [] Diarrhea   [] Vomiting  [] Gastroesophageal reflux/heartburn   [] Difficulty swallowing. Genitourinary:  [] Chronic kidney disease   [] Difficult urination  [] Frequent urination   [] Blood in urine Skin:  [] Rashes   [] Ulcers  Psychological:  [] History of anxiety   []  History of major depression.  Physical Examination  Vitals:   06/09/17 1355  BP: 133/81  Pulse: 62  Resp: 17  Weight: 186 lb (84.4 kg)   Body mass index is 25.23 kg/m. Gen: WD/WN, NAD Head: [place]/AT, No temporalis wasting.  Ear/Nose/Throat: Hearing grossly intact, nares w/o erythema or drainage Eyes: PER, EOMI, sclera nonicteric.  Neck: Supple, no large masses.   Pulmonary:  Good air movement, no audible wheezing bilaterally, no use of accessory muscles.  Cardiac: RRR, no JVD Vascular: palpable cord at the antecubital fossa right side Vessel Right Left  Radial Palpable Palpable  Ulnar Palpable Palpable  Brachial Palpable Palpable  Gastrointestinal: Non-distended. No guarding/no peritoneal signs.  Musculoskeletal: M/S 5/5 throughout.  No deformity or atrophy.  Neurologic: CN 2-12 intact. Symmetrical.  Speech is fluent. Motor exam as listed above. Psychiatric: Judgment intact, Mood & affect appropriate for pt's clinical situation. Dermatologic: No rashes or ulcers noted.  No changes consistent with cellulitis. Lymph : No lichenification or skin changes of chronic lymphedema.  CBC Lab Results  Component Value Date   WBC 6.1 06/08/2017   HGB 14.4 06/08/2017   HCT 42.5 06/08/2017   MCV 89.9 06/08/2017   PLT 257 06/08/2017    BMET    Component Value Date/Time   NA 135 06/08/2017 1458   NA 135 02/27/2015 2026   K 4.2 06/08/2017  1458   K 4.1 02/27/2015 2026   CL 107 06/08/2017 1458   CL 103 02/27/2015 2026   CO2 23 06/08/2017 1458   CO2 26 02/27/2015 2026   GLUCOSE 104 (H) 06/08/2017 1458   GLUCOSE 98 02/27/2015 2026   BUN 17 06/08/2017 1458   BUN 14 02/27/2015 2026   CREATININE 0.74 06/08/2017 1458   CREATININE 1.21 02/27/2015 2026   CALCIUM 8.9 06/08/2017 1458   CALCIUM 8.5 (L) 02/27/2015 2026   GFRNONAA >60 06/08/2017 1458   GFRNONAA >60 02/27/2015 2026   GFRAA >60 06/08/2017 1458   GFRAA >60 02/27/2015 2026   Estimated Creatinine Clearance: 128 mL/min (by C-G formula based on SCr of 0.74  mg/dL).  COAG No results found for: INR, PROTIME  Radiology Mr Jeri Cos Wo Contrast  Result Date: 06/06/2017 CLINICAL DATA:  Memory loss. [place] [address] and hit head while getting out of bed in 12/2016 with persistent head pain. Word-finding difficulty. EXAM: MRI HEAD WITHOUT AND WITH CONTRAST TECHNIQUE: Multiplanar, multiecho pulse sequences of the brain and surrounding structures were obtained without and with intravenous contrast. CONTRAST:  89mL MULTIHANCE GADOBENATE DIMEGLUMINE 529 MG/ML IV SOLN COMPARISON:  Head CT 12/08/2016 FINDINGS: Brain: There is no evidence of acute infarct, intracranial hemorrhage, mass, midline shift, or extra-axial fluid collection. The ventricles and sulci are normal. The brain is normal in signal. No abnormal enhancement is identified. Vascular: Major intracranial vascular flow voids are preserved. Skull and upper cervical spine: Unremarkable bone marrow signal. Sinuses/Orbits: Unremarkable orbits. Mild right greater than left ethmoid air cell mucosal thickening. Clear paranasal scar sec clear mastoid air cells. Other: None. IMPRESSION: Unremarkable appearance of the brain. Electronically Signed   By: Logan Bores M.D.   On: 06/06/2017 14:19   US Venous Img Upper Uni Right  Result Date: 06/08/2017 CLINICAL DATA:  46 y/o M; recent IV in the arm. Pain and swelling since this morning. EXAM: RIGHT UPPER  EXTREMITY VENOUS DOPPLER ULTRASOUND TECHNIQUE: Gray-scale sonography with graded compression, as well as color Doppler and duplex ultrasound were performed to evaluate the upper extremity deep venous system from the level of the subclavian vein and including the jugular, axillary, basilic, radial, ulnar and upper cephalic vein. Spectral Doppler was utilized to evaluate flow at rest and with distal augmentation maneuvers. COMPARISON:  None. FINDINGS: Contralateral Subclavian Vein: Respiratory phasicity is normal and symmetric with the symptomatic side. No evidence of thrombus. Normal compressibility. Internal Jugular Vein: No evidence of thrombus. Normal compressibility, respiratory phasicity and response to augmentation. Subclavian Vein: No evidence of thrombus. Normal compressibility, respiratory phasicity and response to augmentation. Axillary Vein: No evidence of thrombus. Normal compressibility, respiratory phasicity and response to augmentation. Cephalic Vein: No evidence of thrombus. Normal compressibility, respiratory phasicity and response to augmentation. Basilic Vein: No evidence of thrombus. Normal compressibility, respiratory phasicity and response to augmentation. Brachial Veins: Occlusive noncompressible thrombus within the basilic vein. Radial Veins: No evidence of thrombus. Normal compressibility, respiratory phasicity and response to augmentation. Ulnar Veins: No evidence of thrombus. Normal compressibility, respiratory phasicity and response to augmentation. Venous Reflux:  None visualized. Other Findings:  None visualized. IMPRESSION: Occlusive thrombus within the basilic vein. No evidence for deep venous thrombosis. Electronically Signed   By: Kristine Garbe M.D.   On: 06/08/2017 16:11    Assessment/Plan 1. Superficial thrombophlebitis of right upper extremity Recommend:  The patient is complaining of STP.    I have had a long discussion with the patient regarding  STP and why  it causes symptoms.  Patient will begin wearing graduated compression.    The patient  will also begin using over-the-counter analgesics such as Motrin 600 mg po TID to help control the symptoms as needed.    In addition, behavioral modification including elevation during the day will be initiated, elevating.  The patient is also instructed to continue exercising such as walking 4-5 times per week.  At this time the patient wishes to continue conservative therapy   The Patient will follow up in one week  2. Gastroesophageal reflux disease without esophagitis Continue PPI as already ordered, these medications have been reviewed and there are no changes at this time.   3. HYPERCHOLESTEROLEMIA Continue statin as ordered and reviewed,  no changes at this time     Hortencia Pilar, MD  06/09/2017 9:23 PM

## 2017-06-19 ENCOUNTER — Encounter (INDEPENDENT_AMBULATORY_CARE_PROVIDER_SITE_OTHER): Payer: Self-pay | Admitting: Vascular Surgery

## 2017-06-19 ENCOUNTER — Other Ambulatory Visit (INDEPENDENT_AMBULATORY_CARE_PROVIDER_SITE_OTHER): Payer: Self-pay | Admitting: Vascular Surgery

## 2017-06-19 ENCOUNTER — Ambulatory Visit (INDEPENDENT_AMBULATORY_CARE_PROVIDER_SITE_OTHER): Payer: PPO | Admitting: Vascular Surgery

## 2017-06-19 ENCOUNTER — Ambulatory Visit (INDEPENDENT_AMBULATORY_CARE_PROVIDER_SITE_OTHER): Payer: PPO

## 2017-06-19 VITALS — BP 140/86 | HR 78 | Resp 16 | Ht 72.0 in | Wt 185.0 lb

## 2017-06-19 DIAGNOSIS — E7800 Pure hypercholesterolemia, unspecified: Secondary | ICD-10-CM | POA: Diagnosis not present

## 2017-06-19 DIAGNOSIS — I82629 Acute embolism and thrombosis of deep veins of unspecified upper extremity: Secondary | ICD-10-CM

## 2017-06-19 DIAGNOSIS — I808 Phlebitis and thrombophlebitis of other sites: Secondary | ICD-10-CM | POA: Diagnosis not present

## 2017-06-19 DIAGNOSIS — K219 Gastro-esophageal reflux disease without esophagitis: Secondary | ICD-10-CM

## 2017-06-19 MED ORDER — MELOXICAM 15 MG PO TABS: 15.0000 mg | ORAL_TABLET | Freq: Every day | ORAL | 1 refills | Status: DC

## 2017-06-20 DIAGNOSIS — M79605 Pain in left leg: Secondary | ICD-10-CM | POA: Diagnosis not present

## 2017-06-20 DIAGNOSIS — M79672 Pain in left foot: Secondary | ICD-10-CM | POA: Diagnosis not present

## 2017-06-20 DIAGNOSIS — M79604 Pain in right leg: Secondary | ICD-10-CM | POA: Diagnosis not present

## 2017-06-20 DIAGNOSIS — M79675 Pain in left toe(s): Secondary | ICD-10-CM | POA: Diagnosis not present

## 2017-06-20 DIAGNOSIS — G894 Chronic pain syndrome: Secondary | ICD-10-CM | POA: Diagnosis not present

## 2017-06-22 NOTE — Progress Notes (Signed)
MRN : [id number]  Jeremy Delgado is a 46 y.o. ([birth year]) male who presents with chief complaint of  Chief Complaint  Patient presents with  . Follow-up    1 week f/u lt arm  .  History of Present Illness:The patient is seen for follow up of symptomatic STP of the right arm.  He was last seen about two weeks ago. He notes his arm is still very painful but is a little better.  He has finished the Indocin.  The patient relates burning and stinging which worsened steadily throughout the course of the day, particularly with dependency. The patient also notes an aching and throbbing pain over the phlebitis, particularly with prolonged dependent positions. The symptoms are significantly improved with elevation.  The event appears to have been incited by an iv placed for contrast for his MRI.  There is no history of DVT or PE. There is no history of ulceration or hemorrhage. The patient denies a significant family history of venous thrombosis.  Current Meds  Medication Sig  . cloNIDine (CATAPRES) 0.1 MG tablet Take 0.1 mg by mouth 2 (two) times daily.  Marland Kitchen FLUoxetine (PROZAC) 40 MG capsule Take 1 capsule (40 mg total) by mouth daily.  Marland Kitchen gabapentin (NEURONTIN) 600 MG tablet Take 600 mg by mouth 2 (two) times daily.  Marland Kitchen levothyroxine (SYNTHROID, LEVOTHROID) 50 MCG tablet Take 50 mcg by mouth every morning.   . Melatonin 3 MG CAPS Take 1 capsule by mouth at bedtime as needed (sleep).   Marland Kitchen omeprazole (PRILOSEC) 20 MG capsule Take 20 mg by mouth daily.  Marland Kitchen oxyCODONE-acetaminophen (PERCOCET) 5-325 MG tablet Take 1-2 tablets by mouth every 6 (six) hours as needed.  . pravastatin (PRAVACHOL) 40 MG tablet Take 40 mg by mouth every evening.  . [DISCONTINUED] indomethacin (INDOCIN) 25 MG capsule Take 1 capsule (25 mg total) by mouth 3 (three) times daily with meals.    Past Medical History:  Diagnosis Date  . Addiction to drug ([place]) 04/06/2015  . ANXIETY 03/11/2008   Qualifier: Diagnosis of  By: Hassell Done  FNP, Tori Milks     . Arthritis   . BACK PAIN 02/17/2007   Qualifier: Diagnosis of  By: Silvio Pate MD, [facility] Cancer   . Chronic pain   . Colitis    self  . DEPRESSION 11/05/2003   Qualifier: Diagnosis of  By: Hassell Done FNP, Tori Milks    . GERD 12/29/2008   Qualifier: Diagnosis of  By: Hassell Done FNP, Tori Milks    . H/O arthritis 02/20/2015  . H/O: hypothyroidism 02/20/2015  . HYPERCHOLESTEROLEMIA 03/11/2008   Qualifier: Diagnosis of  By: Hassell Done FNP, Tori Milks    . Hypertension   . PAIN IN JOINT PELVIC REGION AND THIGH 03/11/2008   Qualifier: Diagnosis of  By: Hassell Done FNP, Tori Milks    . Renal disorder   . Sciatica   . TOBACCO ABUSE 07/15/2008   Qualifier: Diagnosis of  By: Hassell Done FNP, Tori Milks      Past Surgical History:  Procedure Laterality Date  . HEMORRHOID SURGERY      Social History Social History  Substance Use Topics  . Smoking status: Current Every Day Smoker    Packs/day: 0.50    Types: Cigarettes    Start date: 07/05/1990  . Smokeless tobacco: Never Used     Comment: pt currently uses a E-cig  . Alcohol use 0.0 oz/week     Comment: social     Family History Family History  Problem Relation Age of Onset  .  Depression Mother   . Hypertension Father   . Heart failure Father   . Diabetes Paternal Grandfather     Allergies  Allergen Reactions  . Bee Pollen Anaphylaxis  . Codeine Itching and Nausea Only    "not bad nausea"  . Prednisone Other (See Comments)    Blood pressure goes up      REVIEW OF SYSTEMS (Negative unless checked)  Constitutional: [] Weight loss  [] Fever  [] Chills Cardiac: [] Chest pain   [] Chest pressure   [] Palpitations   [] Shortness of breath when laying flat   [] Shortness of breath with exertion. Vascular:  [] Pain in legs with walking   [] Pain in legs at rest  [] History of DVT   [] Phlebitis   [] Swelling in legs   [] Varicose veins   [] Non-healing ulcers Pulmonary:   [] Uses home oxygen   [] Productive cough   [] Hemoptysis   [] Wheeze  [] COPD    [] Asthma Neurologic:  [] Dizziness   [] Seizures   [] History of stroke   [] History of TIA  [] Aphasia   [] Vissual changes   [] Weakness or numbness in arm   [] Weakness or numbness in leg Musculoskeletal:   [] Joint swelling   [] Joint pain   [] Low back pain Hematologic:  [] Easy bruising  [] Easy bleeding   [] Hypercoagulable state   [] Anemic Gastrointestinal:  [] Diarrhea   [] Vomiting  [] Gastroesophageal reflux/heartburn   [] Difficulty swallowing. Genitourinary:  [] Chronic kidney disease   [] Difficult urination  [] Frequent urination   [] Blood in urine Skin:  [] Rashes   [] Ulcers  Psychological:  [] History of anxiety   []  History of major depression.  Physical Examination  Vitals:   06/19/17 1407  BP: 140/86  Pulse: 78  Resp: 16  Weight: 83.9 kg (185 lb)  Height: 6' (1.829 m)   Body mass index is 25.09 kg/m. Gen: WD/WN, NAD Head: [place]/AT, No temporalis wasting.  Ear/Nose/Throat: Hearing grossly intact, nares w/o erythema or drainage Eyes: PER, EOMI, sclera nonicteric.  Neck: Supple, no large masses.   Pulmonary:  Good air movement, no audible wheezing bilaterally, no use of accessory muscles.  Cardiac: RRR, no JVD Vascular: palpable cord persists Vessel Right Left  Radial Palpable Palpable  Ulnar Palpable Palpable  Brachial Palpable Palpable  Gastrointestinal: Non-distended. No guarding/no peritoneal signs.  Musculoskeletal: M/S 5/5 throughout.  No deformity or atrophy.  Neurologic: CN 2-12 intact. Symmetrical.  Speech is fluent. Motor exam as listed above. Psychiatric: Judgment intact, Mood & affect appropriate for pt's clinical situation. Dermatologic: No rashes or ulcers noted.  No changes consistent with cellulitis. Lymph : No lichenification or skin changes of chronic lymphedema.  CBC Lab Results  Component Value Date   WBC 6.1 06/08/2017   HGB 14.4 06/08/2017   HCT 42.5 06/08/2017   MCV 89.9 06/08/2017   PLT 257 06/08/2017    BMET    Component Value Date/Time   NA 135  06/08/2017 1458   NA 135 02/27/2015 2026   K 4.2 06/08/2017 1458   K 4.1 02/27/2015 2026   CL 107 06/08/2017 1458   CL 103 02/27/2015 2026   CO2 23 06/08/2017 1458   CO2 26 02/27/2015 2026   GLUCOSE 104 (H) 06/08/2017 1458   GLUCOSE 98 02/27/2015 2026   BUN 17 06/08/2017 1458   BUN 14 02/27/2015 2026   CREATININE 0.74 06/08/2017 1458   CREATININE 1.21 02/27/2015 2026   CALCIUM 8.9 06/08/2017 1458   CALCIUM 8.5 (L) 02/27/2015 2026   GFRNONAA >60 06/08/2017 1458   GFRNONAA >60 02/27/2015 2026   GFRAA >60  06/08/2017 1458   GFRAA >60 02/27/2015 2026   Estimated Creatinine Clearance: 128 mL/min (by C-G formula based on SCr of 0.74 mg/dL).  COAG No results found for: INR, PROTIME  Radiology Mr Jeri Cos Wo Contrast  Result Date: 06/06/2017 CLINICAL DATA:  Memory loss. [place] and hit head while getting out of bed in 12/2016 with persistent head pain. Word-finding difficulty. EXAM: MRI HEAD WITHOUT AND WITH CONTRAST TECHNIQUE: Multiplanar, multiecho pulse sequences of the brain and surrounding structures were obtained without and with intravenous contrast. CONTRAST:  48mL MULTIHANCE GADOBENATE DIMEGLUMINE 529 MG/ML IV SOLN COMPARISON:  Head CT 12/08/2016 FINDINGS: Brain: There is no evidence of acute infarct, intracranial hemorrhage, mass, midline shift, or extra-axial fluid collection. The ventricles and sulci are normal. The brain is normal in signal. No abnormal enhancement is identified. Vascular: Major intracranial vascular flow voids are preserved. Skull and upper cervical spine: Unremarkable bone marrow signal. Sinuses/Orbits: Unremarkable orbits. Mild right greater than left ethmoid air cell mucosal thickening. Clear paranasal scar sec clear mastoid air cells. Other: None. IMPRESSION: Unremarkable appearance of the brain. Electronically Signed   By: Logan Bores M.D.   On: 06/06/2017 14:19   US Venous Img Upper Uni Right  Result Date: 06/08/2017 CLINICAL DATA:  46 y/o M; recent IV in the  arm. Pain and swelling since this morning. EXAM: RIGHT UPPER EXTREMITY VENOUS DOPPLER ULTRASOUND TECHNIQUE: Gray-scale sonography with graded compression, as well as color Doppler and duplex ultrasound were performed to evaluate the upper extremity deep venous system from the level of the subclavian vein and including the jugular, axillary, basilic, radial, ulnar and upper cephalic vein. Spectral Doppler was utilized to evaluate flow at rest and with distal augmentation maneuvers. COMPARISON:  None. FINDINGS: Contralateral Subclavian Vein: Respiratory phasicity is normal and symmetric with the symptomatic side. No evidence of thrombus. Normal compressibility. Internal Jugular Vein: No evidence of thrombus. Normal compressibility, respiratory phasicity and response to augmentation. Subclavian Vein: No evidence of thrombus. Normal compressibility, respiratory phasicity and response to augmentation. Axillary Vein: No evidence of thrombus. Normal compressibility, respiratory phasicity and response to augmentation. Cephalic Vein: No evidence of thrombus. Normal compressibility, respiratory phasicity and response to augmentation. Basilic Vein: No evidence of thrombus. Normal compressibility, respiratory phasicity and response to augmentation. Brachial Veins: Occlusive noncompressible thrombus within the basilic vein. Radial Veins: No evidence of thrombus. Normal compressibility, respiratory phasicity and response to augmentation. Ulnar Veins: No evidence of thrombus. Normal compressibility, respiratory phasicity and response to augmentation. Venous Reflux:  None visualized. Other Findings:  None visualized. IMPRESSION: Occlusive thrombus within the basilic vein. No evidence for deep venous thrombosis. Electronically Signed   By: Kristine Garbe M.D.   On: 06/08/2017 16:11   Assessment/Plan 1. Superficial thrombophlebitis of right upper extremity Recommend:  The patient is complaining of STP.   duplex  ultrasound today is negative for DVT.  There has been no significant propagation of his STP.  I have reviewed my discussion with the patient regarding  STP and why it causes symptoms.  Patient has been using compression on his right arm and states this helps.    The patient  will also begin using meloxicam to help control the symptoms as needed.    In addition, behavioral modification including elevation during the day will be initiated, elevating.  The patient is also instructed to continue exercising such as walking 4-5 times per week.  At this time the patient wishes to continue conservative therapy   The Patient will follow up in  three week  2. Gastroesophageal reflux disease without esophagitis Continue PPI as already ordered, these medications have been reviewed and there are no changes at this time.   3. HYPERCHOLESTEROLEMIA Continue statin as ordered and reviewed, no changes at this time     Hortencia Pilar, MD  06/22/2017 1:04 PM

## 2017-07-09 ENCOUNTER — Other Ambulatory Visit (INDEPENDENT_AMBULATORY_CARE_PROVIDER_SITE_OTHER): Payer: Self-pay | Admitting: Vascular Surgery

## 2017-07-09 DIAGNOSIS — M25579 Pain in unspecified ankle and joints of unspecified foot: Principal | ICD-10-CM

## 2017-07-09 DIAGNOSIS — M25473 Effusion, unspecified ankle: Secondary | ICD-10-CM

## 2017-07-17 ENCOUNTER — Ambulatory Visit (INDEPENDENT_AMBULATORY_CARE_PROVIDER_SITE_OTHER): Payer: PPO | Admitting: Vascular Surgery

## 2017-07-17 ENCOUNTER — Encounter (INDEPENDENT_AMBULATORY_CARE_PROVIDER_SITE_OTHER): Payer: Self-pay | Admitting: Vascular Surgery

## 2017-07-17 ENCOUNTER — Ambulatory Visit (INDEPENDENT_AMBULATORY_CARE_PROVIDER_SITE_OTHER): Payer: PPO

## 2017-07-17 VITALS — BP 127/80 | HR 61 | Ht 72.0 in | Wt 190.0 lb

## 2017-07-17 DIAGNOSIS — I808 Phlebitis and thrombophlebitis of other sites: Secondary | ICD-10-CM | POA: Diagnosis not present

## 2017-07-17 DIAGNOSIS — M25473 Effusion, unspecified ankle: Secondary | ICD-10-CM | POA: Diagnosis not present

## 2017-07-17 DIAGNOSIS — M25579 Pain in unspecified ankle and joints of unspecified foot: Secondary | ICD-10-CM

## 2017-07-17 DIAGNOSIS — K219 Gastro-esophageal reflux disease without esophagitis: Secondary | ICD-10-CM

## 2017-07-17 DIAGNOSIS — E7800 Pure hypercholesterolemia, unspecified: Secondary | ICD-10-CM

## 2017-07-17 DIAGNOSIS — G894 Chronic pain syndrome: Secondary | ICD-10-CM | POA: Diagnosis not present

## 2017-07-19 NOTE — Progress Notes (Signed)
MRN : [id number]  Jeremy Delgado is a 46 y.o. (04/29/1971) male who presents with chief complaint of  Chief Complaint  Patient presents with  . Follow-up    1 week DVT follow up  .  History of Present Illness:  The patient is seen for follow up of symptomatic STP of the right arm.  He was last seen about two weeks ago. He notes his arm is much less painful and significantly better.  He has finished the Indocin. The symptoms are significantly improved with elevation. The event appears to have been incited by an iv placed for contrast for his MRI.  There is no history of DVT or PE. There is no history of ulceration or hemorrhage. The patient denies a significant family history of venous thrombosis.  Current Meds  Medication Sig  . aspirin EC 81 MG tablet Take by mouth.  . cloNIDine (CATAPRES) 0.1 MG tablet Take 0.1 mg by mouth 2 (two) times daily.  Marland Kitchen FLUoxetine (PROZAC) 40 MG capsule Take 1 capsule (40 mg total) by mouth daily.  Marland Kitchen gabapentin (NEURONTIN) 300 MG capsule   . gabapentin (NEURONTIN) 600 MG tablet Take 600 mg by mouth 2 (two) times daily.  . indomethacin (INDOCIN) 25 MG capsule TK 1 C PO TID WITH MEALS  . levothyroxine (SYNTHROID, LEVOTHROID) 50 MCG tablet Take 50 mcg by mouth every morning.   . Magnesium Oxide 500 MG TABS Take Over The Counter Magnesium Oxide 400 to 600 mg per day (if develop diarrhea - try magnesium glycinate 400 to 600 mg per day)  . Melatonin 3 MG CAPS Take 1 capsule by mouth at bedtime as needed (sleep).   . meloxicam (MOBIC) 15 MG tablet Take 1 tablet (15 mg total) by mouth daily.  Marland Kitchen omeprazole (PRILOSEC) 20 MG capsule Take 20 mg by mouth daily.  Marland Kitchen oxyCODONE-acetaminophen (PERCOCET) 5-325 MG tablet Take 1-2 tablets by mouth every 6 (six) hours as needed.  . pravastatin (PRAVACHOL) 40 MG tablet Take 40 mg by mouth every evening.  . promethazine (PHENERGAN) 25 MG tablet     Past Medical History:  Diagnosis Date  . Addiction to drug ([place])  04/06/2015  . ANXIETY 03/11/2008   Qualifier: Diagnosis of  By: Hassell Done FNP, Tori Milks     . Arthritis   . BACK PAIN 02/17/2007   Qualifier: Diagnosis of  By: Silvio Pate MD, [facility] Cancer   . Chronic pain   . Colitis    self  . DEPRESSION 11/05/2003   Qualifier: Diagnosis of  By: Hassell Done FNP, Tori Milks    . GERD 12/29/2008   Qualifier: Diagnosis of  By: Hassell Done FNP, Tori Milks    . H/O arthritis 02/20/2015  . H/O: hypothyroidism 02/20/2015  . HYPERCHOLESTEROLEMIA 03/11/2008   Qualifier: Diagnosis of  By: Hassell Done FNP, Tori Milks    . Hypertension   . PAIN IN JOINT PELVIC REGION AND THIGH 03/11/2008   Qualifier: Diagnosis of  By: Hassell Done FNP, Tori Milks    . Renal disorder   . Sciatica   . TOBACCO ABUSE 07/15/2008   Qualifier: Diagnosis of  By: Hassell Done FNP, Tori Milks      Past Surgical History:  Procedure Laterality Date  . HEMORRHOID SURGERY      Social History Social History  Substance Use Topics  . Smoking status: Current Every Day Smoker    Packs/day: 0.50    Types: Cigarettes    Start date: 07/05/1990  . Smokeless tobacco: Never Used     Comment: pt currently uses a  E-cig  . Alcohol use 0.0 oz/week     Comment: social     Family History Family History  Problem Relation Age of Onset  . Depression Mother   . Hypertension Father   . Heart failure Father   . Diabetes Paternal Grandfather     Allergies  Allergen Reactions  . Bee Pollen Anaphylaxis  . Codeine Itching and Nausea Only    "not bad nausea"  . Prednisone Other (See Comments)    Blood pressure goes up      REVIEW OF SYSTEMS (Negative unless checked)  Constitutional: [] Weight loss  [] Fever  [] Chills Cardiac: [] Chest pain   [] Chest pressure   [] Palpitations   [] Shortness of breath when laying flat   [] Shortness of breath with exertion. Vascular:  [] Pain in legs with walking   [] Pain in legs at rest  [] History of DVT   [] Phlebitis   [] Swelling in legs   [] Varicose veins   [] Non-healing ulcers Pulmonary:   [] Uses home oxygen    [] Productive cough   [] Hemoptysis   [] Wheeze  [] COPD   [] Asthma Neurologic:  [] Dizziness   [] Seizures   [] History of stroke   [] History of TIA  [] Aphasia   [] Vissual changes   [] Weakness or numbness in arm   [] Weakness or numbness in leg Musculoskeletal:   [] Joint swelling   [] Joint pain   [] Low back pain Hematologic:  [] Easy bruising  [] Easy bleeding   [] Hypercoagulable state   [] Anemic Gastrointestinal:  [] Diarrhea   [] Vomiting  [] Gastroesophageal reflux/heartburn   [] Difficulty swallowing. Genitourinary:  [] Chronic kidney disease   [] Difficult urination  [] Frequent urination   [] Blood in urine Skin:  [] Rashes   [] Ulcers  Psychological:  [] History of anxiety   []  History of major depression.  Physical Examination  Vitals:   07/17/17 1553  BP: 127/80  Pulse: 61  Weight: 190 lb (86.2 kg)  Height: 6' (1.829 m)   Body mass index is 25.77 kg/m. Gen: WD/WN, NAD Head: [place]/AT, No temporalis wasting.  Ear/Nose/Throat: Hearing grossly intact, nares w/o erythema or drainage Eyes: PER, EOMI, sclera nonicteric.  Neck: Supple, no large masses.   Pulmonary:  Good air movement, no audible wheezing bilaterally, no use of accessory muscles.  Cardiac: RRR, no JVD Vascular:  Palpable cord right arm softer and less tender. Vessel Right Left  Radial Palpable Palpable  Ulnar Palpable Palpable  Brachial Palpable Palpable  Gastrointestinal: Non-distended. No guarding/no peritoneal signs.  Musculoskeletal: M/S 5/5 throughout.  No deformity or atrophy.  Neurologic: CN 2-12 intact. Symmetrical.  Speech is fluent. Motor exam as listed above. Psychiatric: Judgment intact, Mood & affect appropriate for pt's clinical situation. Dermatologic: No rashes or ulcers noted.  No changes consistent with cellulitis. Lymph : No lichenification or skin changes of chronic lymphedema.  CBC Lab Results  Component Value Date   WBC 6.1 06/08/2017   HGB 14.4 06/08/2017   HCT 42.5 06/08/2017   MCV 89.9 06/08/2017   PLT  257 06/08/2017    BMET    Component Value Date/Time   NA 135 06/08/2017 1458   NA 135 02/27/2015 2026   K 4.2 06/08/2017 1458   K 4.1 02/27/2015 2026   CL 107 06/08/2017 1458   CL 103 02/27/2015 2026   CO2 23 06/08/2017 1458   CO2 26 02/27/2015 2026   GLUCOSE 104 (H) 06/08/2017 1458   GLUCOSE 98 02/27/2015 2026   BUN 17 06/08/2017 1458   BUN 14 02/27/2015 2026   CREATININE 0.74 06/08/2017 1458   CREATININE  1.21 02/27/2015 2026   CALCIUM 8.9 06/08/2017 1458   CALCIUM 8.5 (L) 02/27/2015 2026   GFRNONAA >60 06/08/2017 1458   GFRNONAA >60 02/27/2015 2026   GFRAA >60 06/08/2017 1458   GFRAA >60 02/27/2015 2026   CrCl cannot be calculated (Patient's most recent lab result is older than the maximum 21 days allowed.).  COAG No results found for: INR, PROTIME  Radiology No results found.  Assessment/Plan 1. Superficial thrombophlebitis of right upper extremity Recommend:   No surgery or intervention at this point in time.  IVC filter is not indicated at present.  Patient's duplex ultrasound of the venous system shows recanalization of the basilic vein.  Elevation was stressed.  The patient will continue NSAID for now as there have not been any problems or complications at this point.   He will follow up with me PRN   2. Gastroesophageal reflux disease without esophagitis Continue PPI as already ordered, these medications have been reviewed and there are no changes at this time.   3. HYPERCHOLESTEROLEMIA Continue statin as ordered and reviewed, no changes at this time     Hortencia Pilar, MD  07/19/2017 2:53 PM

## 2017-08-02 ENCOUNTER — Emergency Department
Admission: EM | Admit: 2017-08-02 | Discharge: 2017-08-03 | Disposition: A | Discharge status: Home / Self Care | Payer: PPO | Attending: Emergency Medicine | Admitting: Emergency Medicine

## 2017-08-02 ENCOUNTER — Emergency Department: Payer: PPO

## 2017-08-02 ENCOUNTER — Encounter: Payer: Self-pay | Admitting: Emergency Medicine

## 2017-08-02 DIAGNOSIS — R109 Unspecified abdominal pain: Secondary | ICD-10-CM | POA: Diagnosis not present

## 2017-08-02 DIAGNOSIS — S0990XA Unspecified injury of head, initial encounter: Secondary | ICD-10-CM | POA: Insufficient documentation

## 2017-08-02 DIAGNOSIS — Y92410 Unspecified street and highway as the place of occurrence of the external cause: Secondary | ICD-10-CM | POA: Insufficient documentation

## 2017-08-02 DIAGNOSIS — T148XXA Other injury of unspecified body region, initial encounter: Secondary | ICD-10-CM

## 2017-08-02 DIAGNOSIS — M542 Cervicalgia: Secondary | ICD-10-CM | POA: Diagnosis not present

## 2017-08-02 DIAGNOSIS — Y939 Activity, unspecified: Secondary | ICD-10-CM | POA: Diagnosis not present

## 2017-08-02 DIAGNOSIS — I10 Essential (primary) hypertension: Secondary | ICD-10-CM | POA: Insufficient documentation

## 2017-08-02 DIAGNOSIS — R1084 Generalized abdominal pain: Secondary | ICD-10-CM | POA: Diagnosis not present

## 2017-08-02 DIAGNOSIS — M791 Myalgia, unspecified site: Secondary | ICD-10-CM

## 2017-08-02 DIAGNOSIS — S199XXA Unspecified injury of neck, initial encounter: Secondary | ICD-10-CM | POA: Diagnosis not present

## 2017-08-02 DIAGNOSIS — E039 Hypothyroidism, unspecified: Secondary | ICD-10-CM | POA: Diagnosis not present

## 2017-08-02 DIAGNOSIS — F17210 Nicotine dependence, cigarettes, uncomplicated: Secondary | ICD-10-CM | POA: Insufficient documentation

## 2017-08-02 DIAGNOSIS — M25511 Pain in right shoulder: Secondary | ICD-10-CM | POA: Diagnosis not present

## 2017-08-02 DIAGNOSIS — Y999 Unspecified external cause status: Secondary | ICD-10-CM | POA: Diagnosis not present

## 2017-08-02 DIAGNOSIS — Z79899 Other long term (current) drug therapy: Secondary | ICD-10-CM | POA: Diagnosis not present

## 2017-08-02 DIAGNOSIS — Z7982 Long term (current) use of aspirin: Secondary | ICD-10-CM | POA: Insufficient documentation

## 2017-08-02 DIAGNOSIS — S3991XA Unspecified injury of abdomen, initial encounter: Secondary | ICD-10-CM | POA: Diagnosis present

## 2017-08-02 LAB — CBC WITH DIFFERENTIAL/PLATELET
Basophils Absolute: 0 10*3/uL (ref 0–0.1)
Basophils Relative: 0 %
Eosinophils Absolute: 0.2 10*3/uL (ref 0–0.7)
Eosinophils Relative: 2 %
HCT: 41.3 % (ref 40.0–52.0)
Hemoglobin: 14.4 g/dL (ref 13.0–18.0)
Lymphocytes Relative: 32 %
Lymphs Abs: 2.5 10*3/uL (ref 1.0–3.6)
MCH: 31 pg (ref 26.0–34.0)
MCHC: 34.7 g/dL (ref 32.0–36.0)
MCV: 89.4 fL (ref 80.0–100.0)
Monocytes Absolute: 0.8 10*3/uL (ref 0.2–1.0)
Monocytes Relative: 10 %
Neutro Abs: 4.4 10*3/uL (ref 1.4–6.5)
Neutrophils Relative %: 56 %
Platelets: 272 10*3/uL (ref 150–440)
RBC: 4.62 MIL/uL (ref 4.40–5.90)
RDW: 14 % (ref 11.5–14.5)
WBC: 7.9 10*3/uL (ref 3.8–10.6)

## 2017-08-02 LAB — COMPREHENSIVE METABOLIC PANEL
ALT: 12 U/L — ABNORMAL LOW (ref 17–63)
AST: 16 U/L (ref 15–41)
Albumin: 3.6 g/dL (ref 3.5–5.0)
Alkaline Phosphatase: 55 U/L (ref 38–126)
Anion gap: 8 (ref 5–15)
BUN: 23 mg/dL — ABNORMAL HIGH (ref 6–20)
CO2: 26 mmol/L (ref 22–32)
Calcium: 8.9 mg/dL (ref 8.9–10.3)
Chloride: 106 mmol/L (ref 101–111)
Creatinine, Ser: 0.8 mg/dL (ref 0.61–1.24)
GFR calc Af Amer: >60 mL/min (ref >60)
GFR calc non Af Amer: >60 mL/min (ref >60)
Glucose, Bld: 101 mg/dL — ABNORMAL HIGH (ref 65–99)
Potassium: 4.2 mmol/L (ref 3.5–5.1)
Sodium: 140 mmol/L (ref 135–145)
Total Bilirubin: 0.6 mg/dL (ref 0.3–1.2)
Total Protein: 6.4 g/dL — ABNORMAL LOW (ref 6.5–8.1)

## 2017-08-02 MED ORDER — HYDROCODONE-ACETAMINOPHEN 5-325 MG PO TABS: 1.0000 | ORAL_TABLET | Freq: Four times a day (QID) | ORAL | 0 refills | Status: DC | PRN

## 2017-08-02 MED ORDER — DOCUSATE SODIUM 100 MG PO CAPS: ORAL_CAPSULE | ORAL | 0 refills | Status: DC

## 2017-08-02 MED ORDER — IOPAMIDOL (ISOVUE-300) INJECTION 61%
100.0000 mL | Freq: Once | INTRAVENOUS | Status: AC | PRN
  Administered 2017-08-02: 100 mL via INTRAVENOUS
  Filled 2017-08-02: qty 100

## 2017-08-02 NOTE — ED Notes (Signed)
Patient transported to CT 

## 2017-08-02 NOTE — ED Notes (Signed)
Report to christine, rn.  

## 2017-08-02 NOTE — Discharge Instructions (Signed)
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
## 2017-08-02 NOTE — ED Notes (Signed)
Pt returned from CT °
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
## 2017-08-02 NOTE — ED Notes (Signed)
Patient transported to X-ray 
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
## 2017-08-05 DIAGNOSIS — M79604 Pain in right leg: Secondary | ICD-10-CM | POA: Diagnosis not present

## 2017-08-05 DIAGNOSIS — M79672 Pain in left foot: Secondary | ICD-10-CM | POA: Diagnosis not present

## 2017-08-05 DIAGNOSIS — G894 Chronic pain syndrome: Secondary | ICD-10-CM | POA: Diagnosis not present

## 2017-08-05 DIAGNOSIS — M79675 Pain in left toe(s): Secondary | ICD-10-CM | POA: Diagnosis not present

## 2017-08-05 DIAGNOSIS — M79605 Pain in left leg: Secondary | ICD-10-CM | POA: Diagnosis not present

## 2017-08-13 DIAGNOSIS — Z01812 Encounter for preprocedural laboratory examination: Secondary | ICD-10-CM | POA: Diagnosis not present

## 2017-08-13 DIAGNOSIS — G43019 Migraine without aura, intractable, without status migrainosus: Secondary | ICD-10-CM | POA: Diagnosis not present

## 2017-08-13 DIAGNOSIS — E559 Vitamin D deficiency, unspecified: Secondary | ICD-10-CM | POA: Diagnosis not present

## 2017-08-13 DIAGNOSIS — M5481 Occipital neuralgia: Secondary | ICD-10-CM | POA: Diagnosis not present

## 2017-08-13 DIAGNOSIS — R413 Other amnesia: Secondary | ICD-10-CM | POA: Diagnosis not present

## 2017-08-13 DIAGNOSIS — R9082 White matter disease, unspecified: Secondary | ICD-10-CM | POA: Diagnosis not present

## 2017-08-13 DIAGNOSIS — M5416 Radiculopathy, lumbar region: Secondary | ICD-10-CM | POA: Diagnosis not present

## 2017-08-13 DIAGNOSIS — G479 Sleep disorder, unspecified: Secondary | ICD-10-CM | POA: Diagnosis not present

## 2017-08-18 DIAGNOSIS — M79672 Pain in left foot: Secondary | ICD-10-CM | POA: Diagnosis not present

## 2017-08-18 DIAGNOSIS — G890 Central pain syndrome: Secondary | ICD-10-CM | POA: Diagnosis not present

## 2017-08-18 DIAGNOSIS — M79661 Pain in right lower leg: Secondary | ICD-10-CM | POA: Diagnosis not present

## 2017-08-18 DIAGNOSIS — M79662 Pain in left lower leg: Secondary | ICD-10-CM | POA: Diagnosis not present

## 2017-08-18 DIAGNOSIS — G894 Chronic pain syndrome: Secondary | ICD-10-CM | POA: Diagnosis not present

## 2017-08-18 DIAGNOSIS — M79605 Pain in left leg: Secondary | ICD-10-CM | POA: Diagnosis not present

## 2017-08-18 DIAGNOSIS — M79604 Pain in right leg: Secondary | ICD-10-CM | POA: Diagnosis not present

## 2017-08-18 DIAGNOSIS — M545 Low back pain: Secondary | ICD-10-CM | POA: Diagnosis not present

## 2017-08-21 DIAGNOSIS — Z0001 Encounter for general adult medical examination with abnormal findings: Secondary | ICD-10-CM | POA: Diagnosis not present

## 2017-08-21 DIAGNOSIS — I10 Essential (primary) hypertension: Secondary | ICD-10-CM | POA: Diagnosis not present

## 2017-08-21 DIAGNOSIS — R10814 Left lower quadrant abdominal tenderness: Secondary | ICD-10-CM | POA: Diagnosis not present

## 2017-08-21 DIAGNOSIS — F17210 Nicotine dependence, cigarettes, uncomplicated: Secondary | ICD-10-CM | POA: Diagnosis not present

## 2017-08-21 DIAGNOSIS — K219 Gastro-esophageal reflux disease without esophagitis: Secondary | ICD-10-CM | POA: Diagnosis not present

## 2017-08-21 DIAGNOSIS — G894 Chronic pain syndrome: Secondary | ICD-10-CM | POA: Diagnosis not present

## 2017-08-21 DIAGNOSIS — F329 Major depressive disorder, single episode, unspecified: Secondary | ICD-10-CM | POA: Diagnosis not present

## 2017-08-21 DIAGNOSIS — E039 Hypothyroidism, unspecified: Secondary | ICD-10-CM | POA: Diagnosis not present

## 2017-09-01 DIAGNOSIS — M79675 Pain in left toe(s): Secondary | ICD-10-CM | POA: Diagnosis not present

## 2017-09-01 DIAGNOSIS — M79662 Pain in left lower leg: Secondary | ICD-10-CM | POA: Diagnosis not present

## 2017-09-01 DIAGNOSIS — M79661 Pain in right lower leg: Secondary | ICD-10-CM | POA: Diagnosis not present

## 2017-09-01 DIAGNOSIS — M5417 Radiculopathy, lumbosacral region: Secondary | ICD-10-CM | POA: Diagnosis not present

## 2017-09-01 DIAGNOSIS — G894 Chronic pain syndrome: Secondary | ICD-10-CM | POA: Diagnosis not present

## 2017-09-01 DIAGNOSIS — M79672 Pain in left foot: Secondary | ICD-10-CM | POA: Diagnosis not present

## 2017-09-01 DIAGNOSIS — M79604 Pain in right leg: Secondary | ICD-10-CM | POA: Diagnosis not present

## 2017-09-01 DIAGNOSIS — M79605 Pain in left leg: Secondary | ICD-10-CM | POA: Diagnosis not present

## 2017-12-22 ENCOUNTER — Ambulatory Visit: Payer: PPO | Admitting: Nurse Practitioner

## 2017-12-22 ENCOUNTER — Encounter: Payer: Self-pay | Admitting: Nurse Practitioner

## 2017-12-22 VITALS — BP 117/76 | HR 63 | Resp 16 | Ht 72.0 in | Wt 189.0 lb

## 2017-12-22 DIAGNOSIS — F321 Major depressive disorder, single episode, moderate: Secondary | ICD-10-CM

## 2017-12-22 DIAGNOSIS — E039 Hypothyroidism, unspecified: Secondary | ICD-10-CM

## 2017-12-22 DIAGNOSIS — E782 Mixed hyperlipidemia: Secondary | ICD-10-CM

## 2017-12-22 DIAGNOSIS — Z125 Encounter for screening for malignant neoplasm of prostate: Secondary | ICD-10-CM

## 2017-12-22 DIAGNOSIS — K219 Gastro-esophageal reflux disease without esophagitis: Secondary | ICD-10-CM | POA: Diagnosis not present

## 2017-12-22 DIAGNOSIS — I10 Essential (primary) hypertension: Secondary | ICD-10-CM

## 2017-12-22 DIAGNOSIS — G8929 Other chronic pain: Secondary | ICD-10-CM

## 2017-12-22 DIAGNOSIS — R110 Nausea: Secondary | ICD-10-CM

## 2017-12-22 MED ORDER — CLONIDINE HCL 0.1 MG PO TABS: 0.1000 mg | ORAL_TABLET | Freq: Two times a day (BID) | ORAL | 3 refills | Status: DC

## 2017-12-22 MED ORDER — OMEPRAZOLE 20 MG PO CPDR
20.0000 mg | DELAYED_RELEASE_CAPSULE | Freq: Every day | ORAL | 3 refills | Status: DC
Start: 2017-12-22 — End: 2018-04-27

## 2017-12-22 MED ORDER — FLUOXETINE HCL 40 MG PO CAPS: 40.0000 mg | ORAL_CAPSULE | Freq: Every day | ORAL | 3 refills | Status: DC

## 2017-12-22 MED ORDER — GABAPENTIN 600 MG PO TABS: 600.0000 mg | ORAL_TABLET | Freq: Two times a day (BID) | ORAL | 3 refills | Status: DC

## 2017-12-22 MED ORDER — PROMETHAZINE HCL 25 MG PO TABS: 25.0000 mg | ORAL_TABLET | Freq: Three times a day (TID) | ORAL | 3 refills | Status: DC | PRN

## 2017-12-22 MED ORDER — PRAVASTATIN SODIUM 40 MG PO TABS: 40.0000 mg | ORAL_TABLET | Freq: Every evening | ORAL | 3 refills | Status: DC

## 2017-12-22 MED ORDER — LEVOTHYROXINE SODIUM 50 MCG PO TABS: 50.0000 ug | ORAL_TABLET | Freq: Every morning | ORAL | 3 refills | Status: DC

## 2017-12-22 NOTE — Progress Notes (Signed)
Outpatient Services [place], [place] [address]  Internal MEDICINE  Office Visit Note  Patient Name: Jeremy Delgado  [id number]  [id number]  Date of Service: 12/31/2017  Chief Complaint  Patient presents with  . Hypertension    Hypertension  This is a chronic problem. The current episode started more than 1 year ago. The problem is unchanged. The problem is resistant. Associated symptoms include anxiety, headaches and neck pain. Pertinent negatives include no chest pain, palpitations or shortness of breath. Agents associated with hypertension include thyroid hormones. Risk factors for coronary artery disease include dyslipidemia, stress and smoking/tobacco exposure. Past treatments include beta blockers and direct vasodilators. The current treatment provides mild improvement. Compliance problems include psychosocial issues and exercise.  Identifiable causes of hypertension include a thyroid problem.    Pt is here for routine follow up.    Current Medication: Outpatient Encounter Medications as of 12/22/2017  Medication Sig Note  . cloNIDine (CATAPRES) 0.1 MG tablet Take 1 tablet (0.1 mg total) by mouth 2 (two) times daily.   Marland Kitchen FLUoxetine (PROZAC) 40 MG capsule Take 1 capsule (40 mg total) by mouth daily.   Marland Kitchen levothyroxine (SYNTHROID, LEVOTHROID) 50 MCG tablet Take 1 tablet (50 mcg total) by mouth every morning.   . Melatonin 3 MG CAPS Take 1 capsule by mouth at bedtime as needed (sleep).  09/27/2015: .   . omeprazole (PRILOSEC) 20 MG capsule Take 1 capsule (20 mg total) by mouth daily.   . promethazine (PHENERGAN) 25 MG tablet Take 1 tablet (25 mg total) by mouth every 8 (eight) hours as needed for nausea or vomiting.   . [DISCONTINUED] cloNIDine (CATAPRES) 0.1 MG tablet Take 0.1 mg by mouth 2 (two) times daily.   . [DISCONTINUED] FLUoxetine (PROZAC) 40 MG capsule Take 1 capsule (40 mg total) by mouth daily.   . [DISCONTINUED] gabapentin (NEURONTIN) 300 MG capsule     . [DISCONTINUED] levothyroxine (SYNTHROID, LEVOTHROID) 50 MCG tablet Take 50 mcg by mouth every morning.  09/27/2015: .   Marland Kitchen [DISCONTINUED] omeprazole (PRILOSEC) 20 MG capsule Take 20 mg by mouth daily.   . [DISCONTINUED] promethazine (PHENERGAN) 25 MG tablet    . aspirin EC 81 MG tablet Take by mouth.   . docusate sodium (COLACE) 100 MG capsule Take 1 tablet once or twice daily as needed for constipation while taking narcotic pain medicine (Patient not taking: Reported on 12/22/2017)   . gabapentin (NEURONTIN) 600 MG tablet Take 1 tablet (600 mg total) by mouth 2 (two) times daily.   . pravastatin (PRAVACHOL) 40 MG tablet Take 1 tablet (40 mg total) by mouth every evening.   . [DISCONTINUED] gabapentin (NEURONTIN) 600 MG tablet Take 600 mg by mouth 2 (two) times daily.   . [DISCONTINUED] HYDROcodone-acetaminophen (NORCO/VICODIN) 5-325 MG tablet Take 1-2 tablets by mouth every 6 (six) hours as needed for moderate pain.   . [DISCONTINUED] indomethacin (INDOCIN) 25 MG capsule TK 1 C PO TID WITH MEALS   . [DISCONTINUED] Magnesium Oxide 500 MG TABS Take Over The Counter Magnesium Oxide 400 to 600 mg per day (if develop diarrhea - try magnesium glycinate 400 to 600 mg per day)   . [DISCONTINUED] meloxicam (MOBIC) 15 MG tablet Take 1 tablet (15 mg total) by mouth daily.   . [DISCONTINUED] oxyCODONE-acetaminophen (PERCOCET) 5-325 MG tablet Take 1-2 tablets by mouth every 6 (six) hours as needed.   . [DISCONTINUED] pravastatin (PRAVACHOL) 40 MG tablet Take 40 mg by mouth every evening. 09/27/2015: Pt states,  i just stopped taking it   No facility-administered encounter medications on file as of 12/22/2017.     Surgical History: Past Surgical History:  Procedure Laterality Date  . HEMORRHOID SURGERY      Medical History: Past Medical History:  Diagnosis Date  . Addiction to drug ([place]) 04/06/2015  . ANXIETY 03/11/2008   Qualifier: Diagnosis of  By: Hassell Done FNP, Tori Milks     . Arthritis   . BACK PAIN  02/17/2007   Qualifier: Diagnosis of  By: Silvio Pate MD, [facility]   . Chronic pain   . Colitis    self  . DEPRESSION 11/05/2003   Qualifier: Diagnosis of  By: Hassell Done FNP, Tori Milks    . GERD 12/29/2008   Qualifier: Diagnosis of  By: Hassell Done FNP, Tori Milks    . H/O arthritis 02/20/2015  . H/O: hypothyroidism 02/20/2015  . HYPERCHOLESTEROLEMIA 03/11/2008   Qualifier: Diagnosis of  By: Hassell Done FNP, Tori Milks    . Hypertension   . PAIN IN JOINT PELVIC REGION AND THIGH 03/11/2008   Qualifier: Diagnosis of  By: Hassell Done FNP, Tori Milks    . Renal disorder   . Sciatica   . TOBACCO ABUSE 07/15/2008   Qualifier: Diagnosis of  By: Hassell Done FNP, Tori Milks      Family History: Family History  Problem Relation Age of Onset  . Depression Mother   . Hypertension Father   . Heart failure Father   . Diabetes Paternal Grandfather     Social History   Socioeconomic History  . Marital status: Single    Spouse name: Not on file  . Number of children: Not on file  . Years of education: Not on file  . Highest education level: Not on file  Social Needs  . Financial resource strain: Not on file  . Food insecurity - worry: Not on file  . Food insecurity - inability: Not on file  . Transportation needs - medical: Not on file  . Transportation needs - non-medical: Not on file  Occupational History  . Not on file  Tobacco Use  . Smoking status: Current Every Day Smoker    Packs/day: 0.50    Types: Cigarettes    Start date: 07/05/1990  . Smokeless tobacco: Never Used  . Tobacco comment: pt currently uses a E-cig  Substance and Sexual Activity  . Alcohol use: Yes    Alcohol/week: 0.0 oz    Comment: social   . Drug use: Yes    Frequency: 2.0 times per week    Types: Marijuana  . Sexual activity: Yes    Birth control/protection: Condom  Other Topics Concern  . Not on file  Social History Narrative  . Not on file      Review of Systems  Constitutional: Positive for fatigue. Negative for activity change,  chills and unexpected weight change.  HENT: Negative for congestion, postnasal drip, rhinorrhea, sneezing and sore throat.   Eyes: Negative for redness.  Respiratory: Negative for cough, chest tightness, shortness of breath and wheezing.   Cardiovascular: Negative for chest pain and palpitations.  Gastrointestinal: Negative for abdominal pain, constipation, diarrhea, nausea and vomiting.  Endocrine:       History of thyroid disorder.  Genitourinary: Negative for dysuria and frequency.  Musculoskeletal: Positive for arthralgias, back pain and neck pain. Negative for joint swelling.       Patient has long history of chronic pain. Stopped seeing his current pain management provider and would like to be referred to new one.   Skin: Negative  for rash.  Allergic/Immunologic: Positive for environmental allergies.  Neurological: Positive for headaches. Negative for tremors and numbness.  Hematological: Negative for adenopathy. Does not bruise/bleed easily.  Psychiatric/Behavioral: Positive for behavioral problems (Depression). Negative for sleep disturbance and suicidal ideas. The patient is nervous/anxious.     Today's Vitals   12/22/17 1047  BP: 117/76  Pulse: 63  Resp: 16  SpO2: 97%  Weight: 189 lb (85.7 kg)  Height: 6' (1.829 m)    Physical Exam  Constitutional: He is oriented to person, place, and time. He appears well-developed and well-nourished. No distress.  HENT:  Head: Normocephalic and atraumatic.  Mouth/Throat: Oropharynx is clear and moist. No oropharyngeal exudate.  Eyes: EOM are normal. Pupils are equal, round, and reactive to light.  Neck: Neck supple. No JVD present. Spinous process tenderness and muscular tenderness present. Carotid bruit is not present. Decreased range of motion present. No tracheal deviation present. No thyromegaly present.  Cardiovascular: Normal rate, regular rhythm and normal heart sounds. Exam reveals no gallop and no friction rub.  No murmur  heard. Pulmonary/Chest: Effort normal and breath sounds normal. No respiratory distress. He has no wheezes. He has no rales. He exhibits no tenderness.  Abdominal: Soft. Bowel sounds are normal. There is no tenderness.  Musculoskeletal:  Low back pain, which radiates to both hips and sown the legs. Pain is more severe when bending and twisting at the waist. Also more severe when ilfting, pushing, or pulling with the arms.   Lymphadenopathy:    He has no cervical adenopathy.  Neurological: He is alert and oriented to person, place, and time. No cranial nerve deficit.  Skin: Skin is warm and dry. He is not diaphoretic.  Psychiatric: His speech is normal and behavior is normal. Judgment and thought content normal. His mood appears anxious. Cognition and memory are normal. He exhibits a depressed mood. He expresses no homicidal and no suicidal ideation.  Nursing note and vitals reviewed.   Assessment/Plan:  1. Essential hypertension Stable.  - cloNIDine (CATAPRES) 0.1 MG tablet; Take 1 tablet (0.1 mg total) by mouth 2 (two) times daily.  Dispense: 60 tablet; Refill: 3 - CBC with Differential/Platelet - Comprehensive metabolic panel - Lipid panel  2. Acquired hypothyroidism Check thyrpod panel and adjust levothyroxine as indicated.  - levothyroxine (SYNTHROID, LEVOTHROID) 50 MCG tablet; Take 1 tablet (50 mcg total) by mouth every morning.  Dispense: 30 tablet; Refill: 3 - TSH - T4, free  3. Mixed hyperlipidemia Fasting lipid panel and CMP ordered - pravastatin (PRAVACHOL) 40 MG tablet; Take 1 tablet (40 mg total) by mouth every evening.  Dispense: 30 tablet; Refill: 3 - Comprehensive metabolic panel  4. Other chronic pain Refer to new pain management - Ambulatory referral to Pain Clinic - gabapentin (NEURONTIN) 600 MG tablet; Take 1 tablet (600 mg total) by mouth 2 (two) times daily.  Dispense: 60 tablet; Refill: 3  5. Nausea - promethazine (PHENERGAN) 25 MG tablet; Take 1 tablet  (25 mg total) by mouth every 8 (eight) hours as needed for nausea or vomiting.  Dispense: 45 tablet; Refill: 3  6. Gastroesophageal reflux disease without esophagitis - omeprazole (PRILOSEC) 20 MG capsule; Take 1 capsule (20 mg total) by mouth daily.  Dispense: 30 capsule; Refill: 3  7. Current moderate episode of major depressive disorder, unspecified whether recurrent (HCC) - FLUoxetine (PROZAC) 40 MG capsule; Take 1 capsule (40 mg total) by mouth daily.  Dispense: 30 capsule; Refill: 3 - CBC with Differential/Platelet - Lipid panel  8. Screening for prostate cancer - PSA   General Counseling: payten beaumier understanding of the findings of todays visit and agrees with plan of treatment. I have discussed any further diagnostic evaluation that may be needed or ordered today. We also reviewed his medications today. he has been encouraged to call the office with any questions or concerns that should arise related to todays visit.   Marland KitchenHypertension Counseling:   The following hypertensive lifestyle modification were recommended and discussed:  1. Limiting alcohol intake to less than 1 oz/day of ethanol:(24 oz of beer or 8 oz of wine or 2 oz of 100-proof whiskey). 2. Take baby ASA 81 mg daily. 3. Importance of regular aerobic exercise and losing weight. 4. Reduce dietary saturated fat and cholesterol intake for overall cardiovascular health. 5. Maintaining adequate dietary potassium, calcium, and magnesium intake. 6. Regular monitoring of the blood pressure. 7. Reduce sodium intake to less than 100 mmol/day (less than 2.3 gm of sodium or less than 6 gm of sodium choride)   This patient was seen by Leretha Pol, FNP- C in Collaboration with Dr Lavera Guise as a part of collaborative care agreement   Orders Placed This Encounter  Procedures  . PSA  . CBC with Differential/Platelet  . Comprehensive metabolic panel  . Lipid panel  . TSH  . T4, free  . Ambulatory referral to Pain  Clinic    Meds ordered this encounter  Medications  . cloNIDine (CATAPRES) 0.1 MG tablet    Sig: Take 1 tablet (0.1 mg total) by mouth 2 (two) times daily.    Dispense:  60 tablet    Refill:  3    Order Specific Question:   Supervising Provider    Answer:   Lavera Guise [[id number]]  . FLUoxetine (PROZAC) 40 MG capsule    Sig: Take 1 capsule (40 mg total) by mouth daily.    Dispense:  30 capsule    Refill:  3    Order Specific Question:   Supervising Provider    Answer:   Lavera Guise [[id number]]  . gabapentin (NEURONTIN) 600 MG tablet    Sig: Take 1 tablet (600 mg total) by mouth 2 (two) times daily.    Dispense:  60 tablet    Refill:  3    Order Specific Question:   Supervising Provider    Answer:   Lavera Guise [[id number]]  . levothyroxine (SYNTHROID, LEVOTHROID) 50 MCG tablet    Sig: Take 1 tablet (50 mcg total) by mouth every morning.    Dispense:  30 tablet    Refill:  3    Order Specific Question:   Supervising Provider    Answer:   Lavera Guise [[id number]]  . pravastatin (PRAVACHOL) 40 MG tablet    Sig: Take 1 tablet (40 mg total) by mouth every evening.    Dispense:  30 tablet    Refill:  3    Order Specific Question:   Supervising Provider    Answer:   Lavera Guise [[id number]]  . omeprazole (PRILOSEC) 20 MG capsule    Sig: Take 1 capsule (20 mg total) by mouth daily.    Dispense:  30 capsule    Refill:  3    Order Specific Question:   Supervising Provider    Answer:   Lavera Guise [[id number]]  . promethazine (PHENERGAN) 25 MG tablet    Sig: Take 1 tablet (25 mg total) by mouth every 8 (eight) hours as needed  for nausea or vomiting.    Dispense:  45 tablet    Refill:  3    Order Specific Question:   Supervising Provider    Answer:   Lavera Guise [[id number]]    Time spent: 61 Minutes     Dr Lavera Guise Internal medicine

## 2017-12-31 DIAGNOSIS — Z125 Encounter for screening for malignant neoplasm of prostate: Secondary | ICD-10-CM | POA: Insufficient documentation

## 2017-12-31 DIAGNOSIS — F321 Major depressive disorder, single episode, moderate: Secondary | ICD-10-CM | POA: Insufficient documentation

## 2017-12-31 DIAGNOSIS — I10 Essential (primary) hypertension: Secondary | ICD-10-CM | POA: Insufficient documentation

## 2017-12-31 DIAGNOSIS — R110 Nausea: Secondary | ICD-10-CM | POA: Insufficient documentation

## 2017-12-31 DIAGNOSIS — E782 Mixed hyperlipidemia: Secondary | ICD-10-CM | POA: Insufficient documentation

## 2017-12-31 DIAGNOSIS — E039 Hypothyroidism, unspecified: Secondary | ICD-10-CM | POA: Insufficient documentation

## 2017-12-31 DIAGNOSIS — G8929 Other chronic pain: Secondary | ICD-10-CM | POA: Insufficient documentation

## 2018-01-26 DIAGNOSIS — M542 Cervicalgia: Secondary | ICD-10-CM | POA: Diagnosis not present

## 2018-01-26 DIAGNOSIS — M4602 Spinal enthesopathy, cervical region: Secondary | ICD-10-CM | POA: Diagnosis not present

## 2018-01-26 DIAGNOSIS — M9901 Segmental and somatic dysfunction of cervical region: Secondary | ICD-10-CM | POA: Diagnosis not present

## 2018-01-29 DIAGNOSIS — M542 Cervicalgia: Secondary | ICD-10-CM | POA: Diagnosis not present

## 2018-01-29 DIAGNOSIS — M9901 Segmental and somatic dysfunction of cervical region: Secondary | ICD-10-CM | POA: Diagnosis not present

## 2018-01-29 DIAGNOSIS — M4602 Spinal enthesopathy, cervical region: Secondary | ICD-10-CM | POA: Diagnosis not present

## 2018-02-04 ENCOUNTER — Ambulatory Visit: Payer: PPO | Admitting: Nurse Practitioner

## 2018-03-06 DIAGNOSIS — M5481 Occipital neuralgia: Secondary | ICD-10-CM | POA: Diagnosis not present

## 2018-03-06 DIAGNOSIS — R413 Other amnesia: Secondary | ICD-10-CM | POA: Diagnosis not present

## 2018-03-06 DIAGNOSIS — E559 Vitamin D deficiency, unspecified: Secondary | ICD-10-CM | POA: Diagnosis not present

## 2018-03-06 DIAGNOSIS — M5416 Radiculopathy, lumbar region: Secondary | ICD-10-CM | POA: Diagnosis not present

## 2018-03-06 DIAGNOSIS — G43019 Migraine without aura, intractable, without status migrainosus: Secondary | ICD-10-CM | POA: Diagnosis not present

## 2018-03-06 DIAGNOSIS — G479 Sleep disorder, unspecified: Secondary | ICD-10-CM | POA: Diagnosis not present

## 2018-04-17 ENCOUNTER — Other Ambulatory Visit: Payer: Self-pay

## 2018-04-17 DIAGNOSIS — F321 Major depressive disorder, single episode, moderate: Secondary | ICD-10-CM

## 2018-04-17 MED ORDER — FLUOXETINE HCL 40 MG PO CAPS: 40.0000 mg | ORAL_CAPSULE | Freq: Every day | ORAL | 3 refills | Status: DC

## 2018-04-21 ENCOUNTER — Other Ambulatory Visit: Payer: Self-pay | Admitting: Nurse Practitioner

## 2018-04-21 ENCOUNTER — Ambulatory Visit: Payer: Self-pay | Admitting: Nurse Practitioner

## 2018-04-21 DIAGNOSIS — E559 Vitamin D deficiency, unspecified: Secondary | ICD-10-CM | POA: Diagnosis not present

## 2018-04-21 DIAGNOSIS — G43019 Migraine without aura, intractable, without status migrainosus: Secondary | ICD-10-CM | POA: Diagnosis not present

## 2018-04-21 DIAGNOSIS — M5481 Occipital neuralgia: Secondary | ICD-10-CM | POA: Diagnosis not present

## 2018-04-21 DIAGNOSIS — R413 Other amnesia: Secondary | ICD-10-CM | POA: Diagnosis not present

## 2018-04-21 DIAGNOSIS — M5416 Radiculopathy, lumbar region: Secondary | ICD-10-CM | POA: Diagnosis not present

## 2018-04-21 DIAGNOSIS — G479 Sleep disorder, unspecified: Secondary | ICD-10-CM | POA: Diagnosis not present

## 2018-04-21 DIAGNOSIS — R9082 White matter disease, unspecified: Secondary | ICD-10-CM | POA: Diagnosis not present

## 2018-04-21 DIAGNOSIS — Z01812 Encounter for preprocedural laboratory examination: Secondary | ICD-10-CM | POA: Diagnosis not present

## 2018-04-22 ENCOUNTER — Other Ambulatory Visit: Payer: Self-pay | Admitting: Nurse Practitioner

## 2018-04-22 DIAGNOSIS — F321 Major depressive disorder, single episode, moderate: Secondary | ICD-10-CM

## 2018-04-22 MED ORDER — FLUOXETINE HCL 40 MG PO CAPS: 40.0000 mg | ORAL_CAPSULE | Freq: Two times a day (BID) | ORAL | 3 refills | Status: DC

## 2018-04-23 DIAGNOSIS — R413 Other amnesia: Secondary | ICD-10-CM | POA: Insufficient documentation

## 2018-04-23 DIAGNOSIS — M5416 Radiculopathy, lumbar region: Secondary | ICD-10-CM | POA: Insufficient documentation

## 2018-04-23 DIAGNOSIS — M5481 Occipital neuralgia: Secondary | ICD-10-CM | POA: Insufficient documentation

## 2018-04-27 ENCOUNTER — Other Ambulatory Visit: Payer: Self-pay

## 2018-04-27 DIAGNOSIS — I10 Essential (primary) hypertension: Secondary | ICD-10-CM

## 2018-04-27 DIAGNOSIS — K219 Gastro-esophageal reflux disease without esophagitis: Secondary | ICD-10-CM

## 2018-04-27 DIAGNOSIS — G8929 Other chronic pain: Secondary | ICD-10-CM

## 2018-04-27 MED ORDER — GABAPENTIN 600 MG PO TABS: 600.0000 mg | ORAL_TABLET | Freq: Two times a day (BID) | ORAL | 1 refills | Status: DC

## 2018-04-27 MED ORDER — CLONIDINE HCL 0.1 MG PO TABS: 0.1000 mg | ORAL_TABLET | Freq: Two times a day (BID) | ORAL | 3 refills | Status: DC

## 2018-04-27 MED ORDER — OMEPRAZOLE 20 MG PO CPDR: 20.0000 mg | DELAYED_RELEASE_CAPSULE | Freq: Every day | ORAL | 3 refills | Status: DC

## 2018-05-12 DIAGNOSIS — M7918 Myalgia, other site: Secondary | ICD-10-CM | POA: Diagnosis not present

## 2018-05-12 DIAGNOSIS — G8929 Other chronic pain: Secondary | ICD-10-CM | POA: Diagnosis not present

## 2018-05-12 DIAGNOSIS — M5481 Occipital neuralgia: Secondary | ICD-10-CM | POA: Diagnosis not present

## 2018-05-25 ENCOUNTER — Other Ambulatory Visit: Payer: Self-pay | Admitting: Nurse Practitioner

## 2018-05-25 DIAGNOSIS — E039 Hypothyroidism, unspecified: Secondary | ICD-10-CM

## 2018-05-25 MED ORDER — LEVOTHYROXINE SODIUM 50 MCG PO TABS: 50.0000 ug | ORAL_TABLET | Freq: Every morning | ORAL | 3 refills | Status: DC

## 2018-06-05 ENCOUNTER — Ambulatory Visit: Payer: PPO | Admitting: Adult Health

## 2018-06-05 ENCOUNTER — Encounter: Payer: Self-pay | Admitting: Adult Health

## 2018-06-05 VITALS — BP 120/88 | HR 70 | Ht 70.0 in | Wt 170.0 lb

## 2018-06-05 DIAGNOSIS — K5900 Constipation, unspecified: Secondary | ICD-10-CM

## 2018-06-05 DIAGNOSIS — M25559 Pain in unspecified hip: Secondary | ICD-10-CM | POA: Diagnosis not present

## 2018-06-05 DIAGNOSIS — R110 Nausea: Secondary | ICD-10-CM | POA: Diagnosis not present

## 2018-06-05 DIAGNOSIS — G8929 Other chronic pain: Secondary | ICD-10-CM

## 2018-06-05 MED ORDER — PROMETHAZINE HCL 25 MG PO TABS: 25.0000 mg | ORAL_TABLET | Freq: Three times a day (TID) | ORAL | 0 refills | Status: DC | PRN

## 2018-06-05 MED ORDER — GABAPENTIN 600 MG PO TABS: 600.0000 mg | ORAL_TABLET | Freq: Two times a day (BID) | ORAL | 1 refills | Status: DC

## 2018-06-05 NOTE — Progress Notes (Signed)
[facility] [place], [place] [address]  Internal MEDICINE  Office Visit Note  Patient Name: Jeremy Delgado  [id number]  [id number]  Date of Service: 06/05/2018  Chief Complaint  Patient presents with  . Hypertension  . Abdominal Pain    for about a week and half   . Weight Loss    patient has lost 19 pounds, patient notices hungry but can  not eat   . Headache    Hypertension  This is a recurrent problem. The current episode started more than 1 year ago. The problem is uncontrolled. Associated symptoms include headaches and malaise/fatigue. Pertinent negatives include no chest pain, neck pain, palpitations or shortness of breath.  Abdominal Pain  This is a new problem. The current episode started more than 1 year ago. The onset quality is sudden. The problem occurs intermittently. The pain is located in the LUQ, RUQ and epigastric region. The pain is mild. Associated symptoms include headaches. Pertinent negatives include no arthralgias, constipation, diarrhea, dysuria, frequency, nausea or vomiting.  Headache   Associated symptoms include abdominal pain. Pertinent negatives include no back pain, coughing, eye redness, nausea, neck pain, numbness, rhinorrhea, sore throat or vomiting. His past medical history is significant for hypertension.   Pt here for follow up and med refill.  He is also complaining of stomach pain, constipation with diarrhea x 2 weeks. He reports no more than 2 episodes a day of diarrhea but has days where he doesn't go.     Current Medication: Outpatient Encounter Medications as of 06/05/2018  Medication Sig Note  . cloNIDine (CATAPRES) 0.1 MG tablet Take 1 tablet (0.1 mg total) by mouth 2 (two) times daily.   Marland Kitchen FLUoxetine (PROZAC) 40 MG capsule Take 1 capsule (40 mg total) by mouth 2 (two) times daily.   Marland Kitchen gabapentin (NEURONTIN) 600 MG tablet Take 1 tablet (600 mg total) by mouth 2 (two) times daily.   Marland Kitchen levothyroxine (SYNTHROID,  LEVOTHROID) 50 MCG tablet Take 1 tablet (50 mcg total) by mouth every morning.   . Melatonin 3 MG CAPS Take 1 capsule by mouth at bedtime as needed (sleep).  09/27/2015: .   . omeprazole (PRILOSEC) 20 MG capsule Take 1 capsule (20 mg total) by mouth daily.   . [DISCONTINUED] aspirin EC 81 MG tablet Take by mouth.   . [DISCONTINUED] docusate sodium (COLACE) 100 MG capsule Take 1 tablet once or twice daily as needed for constipation while taking narcotic pain medicine (Patient not taking: Reported on 12/22/2017)   . [DISCONTINUED] pravastatin (PRAVACHOL) 40 MG tablet Take 1 tablet (40 mg total) by mouth every evening.   . [DISCONTINUED] promethazine (PHENERGAN) 25 MG tablet Take 1 tablet (25 mg total) by mouth every 8 (eight) hours as needed for nausea or vomiting.    No facility-administered encounter medications on file as of 06/05/2018.     Surgical History: Past Surgical History:  Procedure Laterality Date  . HEMORRHOID SURGERY      Medical History: Past Medical History:  Diagnosis Date  . Addiction to drug ([place]) 04/06/2015  . ANXIETY 03/11/2008   Qualifier: Diagnosis of  By: Hassell Done FNP, Tori Milks     . Arthritis   . BACK PAIN 02/17/2007   Qualifier: Diagnosis of  By: Silvio Pate MD, [facility]   . Chronic pain   . Colitis    self  . DEPRESSION 11/05/2003   Qualifier: Diagnosis of  By: Hassell Done FNP, Tori Milks    . GERD 12/29/2008  Qualifier: Diagnosis of  By: Hassell Done FNP, Tori Milks    . H/O arthritis 02/20/2015  . H/O: hypothyroidism 02/20/2015  . HYPERCHOLESTEROLEMIA 03/11/2008   Qualifier: Diagnosis of  By: Hassell Done FNP, Tori Milks    . Hypertension   . PAIN IN JOINT PELVIC REGION AND THIGH 03/11/2008   Qualifier: Diagnosis of  By: Hassell Done FNP, Tori Milks    . Renal disorder   . Sciatica   . TOBACCO ABUSE 07/15/2008   Qualifier: Diagnosis of  By: Hassell Done FNP, Tori Milks      Family History: Family History  Problem Relation Age of Onset  . Depression Mother   . Hypertension Father   . Heart failure  Father   . Diabetes Paternal Grandfather     Social History   Socioeconomic History  . Marital status: Single    Spouse name: Not on file  . Number of children: Not on file  . Years of education: Not on file  . Highest education level: Not on file  Occupational History  . Not on file  Social Needs  . Financial resource strain: Not on file  . Food insecurity:    Worry: Not on file    Inability: Not on file  . Transportation needs:    Medical: Not on file    Non-medical: Not on file  Tobacco Use  . Smoking status: Current Every Day Smoker    Packs/day: 0.50    Types: Cigarettes    Start date: 07/05/1990  . Smokeless tobacco: Never Used  . Tobacco comment: pt currently uses a E-cig  Substance and Sexual Activity  . Alcohol use: Yes    Alcohol/week: 0.0 oz    Comment: social   . Drug use: Yes    Frequency: 2.0 times per week    Types: Marijuana  . Sexual activity: Yes    Birth control/protection: Condom  Lifestyle  . Physical activity:    Days per week: Not on file    Minutes per session: Not on file  . Stress: Not on file  Relationships  . Social connections:    Talks on phone: Not on file    Gets together: Not on file    Attends religious service: Not on file    Active member of club or organization: Not on file    Attends meetings of clubs or organizations: Not on file    Relationship status: Not on file  . Intimate partner violence:    Fear of current or ex partner: Not on file    Emotionally abused: Not on file    Physically abused: Not on file    Forced sexual activity: Not on file  Other Topics Concern  . Not on file  Social History Narrative  . Not on file      Review of Systems  Constitutional: Positive for malaise/fatigue. Negative for chills, fatigue and unexpected weight change.  HENT: Negative.  Negative for congestion, rhinorrhea, sneezing and sore throat.   Eyes: Negative for redness.  Respiratory: Negative.  Negative for cough, chest  tightness and shortness of breath.   Cardiovascular: Negative.  Negative for chest pain and palpitations.  Gastrointestinal: Positive for abdominal pain. Negative for constipation, diarrhea, nausea and vomiting.  Endocrine: Negative.   Genitourinary: Negative.  Negative for dysuria and frequency.  Musculoskeletal: Negative.  Negative for arthralgias, back pain, joint swelling and neck pain.  Skin: Negative.  Negative for rash.  Allergic/Immunologic: Negative.   Neurological: Positive for headaches. Negative for tremors and numbness.  Hematological: Negative  for adenopathy. Does not bruise/bleed easily.  Psychiatric/Behavioral: Negative.  Negative for behavioral problems, sleep disturbance and suicidal ideas. The patient is not nervous/anxious.     Vital Signs: BP 120/88   Pulse 70   Ht 5\' 10"  (1.778 m)   Wt 170 lb (77.1 kg)   SpO2 96%   BMI 24.39 kg/m    Physical Exam  Constitutional: He is oriented to person, place, and time. He appears well-developed and well-nourished. No distress.  HENT:  Head: Normocephalic and atraumatic.  Mouth/Throat: Oropharynx is clear and moist. No oropharyngeal exudate.  Eyes: Pupils are equal, round, and reactive to light. EOM are normal.  Neck: Normal range of motion. Neck supple. No JVD present. No tracheal deviation present. No thyromegaly present.  Cardiovascular: Normal rate, regular rhythm and normal heart sounds. Exam reveals no gallop and no friction rub.  No murmur heard. Pulmonary/Chest: Effort normal and breath sounds normal. No respiratory distress. He has no wheezes. He has no rales. He exhibits no tenderness.  Abdominal: Soft. There is no tenderness. There is no guarding.  Musculoskeletal: Normal range of motion.  Lymphadenopathy:    He has no cervical adenopathy.  Neurological: He is alert and oriented to person, place, and time. No cranial nerve deficit.  Skin: Skin is warm and dry. He is not diaphoretic.  Psychiatric: He has a  normal mood and affect. His behavior is normal. Judgment and thought content normal.  Nursing note and vitals reviewed.  Assessment/Plan: 1. Nausea - promethazine (PHENERGAN) 25 MG tablet; Take 1 tablet (25 mg total) by mouth every 8 (eight) hours as needed for nausea or vomiting.  Dispense: 20 tablet; Refill: 0  2. Other chronic pain Refill - gabapentin (NEURONTIN) 600 MG tablet; Take 1 tablet (600 mg total) by mouth 2 (two) times daily.  Dispense: 60 tablet; Refill: 1  3. Constipation, unspecified constipation type Given Miralax samples.  Encouraged pt to get a fleets enema an use it.      General Counseling: reyden smith understanding of the findings of todays visit and agrees with plan of treatment. I have discussed any further diagnostic evaluation that may be needed or ordered today. We also reviewed his medications today. he has been encouraged to call the office with any questions or concerns that should arise related to todays visit.    No orders of the defined types were placed in this encounter.   No orders of the defined types were placed in this encounter.   Time spent: 25 Minutes   This patient was seen by Orson Gear AGNP-C in Collaboration with Dr Lavera Guise as a part of collaborative care agreement    Dr Lavera Guise Internal medicine

## 2018-06-05 NOTE — Patient Instructions (Signed)

## 2018-06-11 ENCOUNTER — Encounter: Payer: Self-pay | Admitting: Adult Health

## 2018-06-25 ENCOUNTER — Ambulatory Visit (INDEPENDENT_AMBULATORY_CARE_PROVIDER_SITE_OTHER): Payer: PPO | Admitting: Adult Health

## 2018-06-25 ENCOUNTER — Encounter: Payer: Self-pay | Admitting: Adult Health

## 2018-06-25 VITALS — BP 142/98 | HR 95 | Temp 98.4°F | Resp 16 | Ht 72.0 in | Wt 171.0 lb

## 2018-06-25 DIAGNOSIS — K5900 Constipation, unspecified: Secondary | ICD-10-CM

## 2018-06-25 DIAGNOSIS — R110 Nausea: Secondary | ICD-10-CM | POA: Diagnosis not present

## 2018-06-25 DIAGNOSIS — R109 Unspecified abdominal pain: Secondary | ICD-10-CM

## 2018-06-25 MED ORDER — PROMETHAZINE HCL 25 MG PO TABS: 25.0000 mg | ORAL_TABLET | Freq: Three times a day (TID) | ORAL | 0 refills | Status: DC | PRN

## 2018-06-25 NOTE — Progress Notes (Signed)
[facility] [place], [place] [address]  Internal MEDICINE  Office Visit Note  Patient Name: Jeremy Delgado  [id number]  [id number]  Date of Service: 07/12/2018  Chief Complaint  Patient presents with  . Abdominal Pain     HPI Pt is here for a sick visit. Pt here reports continued abdominal pain.  Since last visit he has been able to have a bowel movement everyday.  He describes it as loose, but not diarrhea.  He reports some foods make it better but most of the time eating makes his stomach feel bloated and painful.  He reports consistent nausea without vomiting.  He describes the pain as a stabbing pain around his stomach.    Current Medication:  Outpatient Encounter Medications as of 06/25/2018  Medication Sig Note  . cloNIDine (CATAPRES) 0.1 MG tablet Take 1 tablet (0.1 mg total) by mouth 2 (two) times daily.   Marland Kitchen FLUoxetine (PROZAC) 40 MG capsule Take 1 capsule (40 mg total) by mouth 2 (two) times daily.   Marland Kitchen gabapentin (NEURONTIN) 600 MG tablet Take 1 tablet (600 mg total) by mouth 2 (two) times daily.   Marland Kitchen levothyroxine (SYNTHROID, LEVOTHROID) 50 MCG tablet Take 1 tablet (50 mcg total) by mouth every morning.   . Melatonin 3 MG CAPS Take 1 capsule by mouth at bedtime as needed (sleep).  09/27/2015: .   . omeprazole (PRILOSEC) 20 MG capsule Take 1 capsule (20 mg total) by mouth daily.   . promethazine (PHENERGAN) 25 MG tablet Take 1 tablet (25 mg total) by mouth every 8 (eight) hours as needed for nausea or vomiting.   . [DISCONTINUED] promethazine (PHENERGAN) 25 MG tablet Take 1 tablet (25 mg total) by mouth every 8 (eight) hours as needed for nausea or vomiting.    No facility-administered encounter medications on file as of 06/25/2018.       Medical History: Past Medical History:  Diagnosis Date  . Addiction to drug ([place]) 04/06/2015  . ANXIETY 03/11/2008   Qualifier: Diagnosis of  By: Hassell Done FNP, Tori Milks     . Arthritis   . BACK PAIN 02/17/2007    Qualifier: Diagnosis of  By: Silvio Pate MD, [facility]   . Chronic pain   . Colitis    self  . DEPRESSION 11/05/2003   Qualifier: Diagnosis of  By: Hassell Done FNP, Tori Milks    . GERD 12/29/2008   Qualifier: Diagnosis of  By: Hassell Done FNP, Tori Milks    . H/O arthritis 02/20/2015  . H/O: hypothyroidism 02/20/2015  . HYPERCHOLESTEROLEMIA 03/11/2008   Qualifier: Diagnosis of  By: Hassell Done FNP, Tori Milks    . Hypertension   . PAIN IN JOINT PELVIC REGION AND THIGH 03/11/2008   Qualifier: Diagnosis of  By: Hassell Done FNP, Tori Milks    . Renal disorder   . Sciatica   . TOBACCO ABUSE 07/15/2008   Qualifier: Diagnosis of  By: Hassell Done FNP, [place]       Vital Signs: BP (!) 142/98   Pulse 95   Temp 98.4 F (36.9 C)   Resp 16   Ht 6' (1.829 m)   Wt 171 lb (77.6 kg)   SpO2 96%   BMI 23.19 kg/m    Review of Systems  Constitutional: Negative.  Negative for chills, fatigue and unexpected weight change.  HENT: Negative.  Negative for congestion, rhinorrhea, sneezing and sore throat.   Eyes: Negative for redness.  Respiratory: Negative.  Negative for cough, chest tightness and shortness of breath.   Cardiovascular:  Negative.  Negative for chest pain and palpitations.  Gastrointestinal: Positive for abdominal pain and constipation. Negative for diarrhea, nausea and vomiting.  Endocrine: Negative.   Genitourinary: Negative.  Negative for dysuria and frequency.  Musculoskeletal: Negative.  Negative for arthralgias, back pain, joint swelling and neck pain.  Skin: Negative.  Negative for rash.  Allergic/Immunologic: Negative.   Neurological: Negative.  Negative for tremors and numbness.  Hematological: Negative for adenopathy. Does not bruise/bleed easily.  Psychiatric/Behavioral: Negative.  Negative for behavioral problems, sleep disturbance and suicidal ideas. The patient is not nervous/anxious.     Physical Exam  Constitutional: He is oriented to person, place, and time. He appears well-developed and  well-nourished. No distress.  HENT:  Head: Normocephalic and atraumatic.  Mouth/Throat: Oropharynx is clear and moist. No oropharyngeal exudate.  Eyes: Pupils are equal, round, and reactive to light. EOM are normal.  Neck: Normal range of motion. Neck supple. No JVD present. No tracheal deviation present. No thyromegaly present.  Cardiovascular: Normal rate, regular rhythm and normal heart sounds. Exam reveals no gallop and no friction rub.  No murmur heard. Pulmonary/Chest: Effort normal and breath sounds normal. No respiratory distress. He has no wheezes. He has no rales. He exhibits no tenderness.  Abdominal: Soft. There is no tenderness. There is no guarding.  Genitourinary: Prostate normal. Rectal exam shows guaiac positive stool.  Musculoskeletal: Normal range of motion.  Lymphadenopathy:    He has no cervical adenopathy.  Neurological: He is alert and oriented to person, place, and time. No cranial nerve deficit.  Skin: Skin is warm and dry. He is not diaphoretic.  Psychiatric: He has a normal mood and affect. His behavior is normal. Judgment and thought content normal.  Nursing note and vitals reviewed.   Assessment/Plan: 1. Abdominal pain, unspecified abdominal location Consistent Abdominal pain for multiple weeks.  Pt has had some constipation.  Guiac positive in office.  Concerned for Gastric Ulcer.  Abd pain could be as side effect of Marijuana use. Discussed stopping with patient he is agreeable at this time.  - CT Abdomen Pelvis W Contrast; Future -Amb Referral to Gastroenterology.  2. Constipation, unspecified constipation type Patient have some relief with Miralax.    3. Nausea Continue to use phenergan.  - CT Abdomen Pelvis W Contrast; Future -RX for Phenergan 25mg  PO.  #20. 0 refills.  General Counseling: chrishon martino understanding of the findings of todays visit and agrees with plan of treatment. I have discussed any further diagnostic evaluation that may be  needed or ordered today. We also reviewed his medications today. he has been encouraged to call the office with any questions or concerns that should arise related to todays visit.   Orders Placed This Encounter  Procedures  . CT Abdomen Pelvis W Contrast  . Ambulatory referral to Gastroenterology    Meds ordered this encounter  Medications  . promethazine (PHENERGAN) 25 MG tablet    Sig: Take 1 tablet (25 mg total) by mouth every 8 (eight) hours as needed for nausea or vomiting.    Dispense:  20 tablet    Refill:  0    Time spent: 25 Minutes  This patient was seen by Orson Gear AGNP-C in Collaboration with Dr Lavera Guise as a part of collaborative care agreement

## 2018-07-09 ENCOUNTER — Ambulatory Visit
Admission: RE | Admit: 2018-07-09 | Discharge: 2018-07-09 | Disposition: A | Discharge status: Home / Self Care | Payer: PPO | Source: Ambulatory Visit | Attending: Adult Health | Admitting: Adult Health

## 2018-07-09 DIAGNOSIS — R110 Nausea: Secondary | ICD-10-CM | POA: Diagnosis not present

## 2018-07-09 DIAGNOSIS — R109 Unspecified abdominal pain: Secondary | ICD-10-CM | POA: Diagnosis not present

## 2018-07-09 DIAGNOSIS — K439 Ventral hernia without obstruction or gangrene: Secondary | ICD-10-CM | POA: Diagnosis not present

## 2018-07-09 DIAGNOSIS — K5900 Constipation, unspecified: Secondary | ICD-10-CM | POA: Diagnosis not present

## 2018-07-09 DIAGNOSIS — K760 Fatty (change of) liver, not elsewhere classified: Secondary | ICD-10-CM | POA: Insufficient documentation

## 2018-07-09 MED ORDER — IOPAMIDOL (ISOVUE-300) INJECTION 61%
100.0000 mL | Freq: Once | INTRAVENOUS | Status: AC | PRN
  Administered 2018-07-09: 100 mL via INTRAVENOUS

## 2018-07-16 ENCOUNTER — Encounter: Payer: Self-pay | Admitting: Adult Health

## 2018-07-16 ENCOUNTER — Ambulatory Visit (INDEPENDENT_AMBULATORY_CARE_PROVIDER_SITE_OTHER): Payer: PPO | Admitting: Adult Health

## 2018-07-16 VITALS — BP 119/73 | HR 64 | Temp 97.9°F | Resp 16 | Ht 72.0 in | Wt 174.0 lb

## 2018-07-16 DIAGNOSIS — R110 Nausea: Secondary | ICD-10-CM | POA: Diagnosis not present

## 2018-07-16 DIAGNOSIS — K219 Gastro-esophageal reflux disease without esophagitis: Secondary | ICD-10-CM | POA: Diagnosis not present

## 2018-07-16 DIAGNOSIS — Z23 Encounter for immunization: Secondary | ICD-10-CM

## 2018-07-16 DIAGNOSIS — R109 Unspecified abdominal pain: Secondary | ICD-10-CM | POA: Diagnosis not present

## 2018-07-16 DIAGNOSIS — E039 Hypothyroidism, unspecified: Secondary | ICD-10-CM

## 2018-07-16 MED ORDER — PROMETHAZINE HCL 25 MG PO TABS: 25.0000 mg | ORAL_TABLET | Freq: Three times a day (TID) | ORAL | 0 refills | Status: DC | PRN

## 2018-07-16 MED ORDER — OMEPRAZOLE 20 MG PO CPDR: 20.0000 mg | DELAYED_RELEASE_CAPSULE | Freq: Two times a day (BID) | ORAL | 3 refills | Status: DC

## 2018-07-16 NOTE — Patient Instructions (Signed)
Abdominal Bloating °When you have abdominal bloating, your abdomen may feel full, tight, or painful. It may also look bigger than normal or swollen (distended). Common causes of abdominal bloating include: °· Swallowing air. °· Constipation. °· Problems digesting food. °· Eating too much. °· Irritable bowel syndrome. This is a condition that affects the large intestine. °· Lactose intolerance. This is an inability to digest lactose, a natural sugar in dairy products. °· Celiac disease. This is a condition that affects the ability to digest gluten, a protein found in some grains. °· Gastroparesis. This is a condition that slows down the movement of food in the stomach and small intestine. It is more common in people with diabetes mellitus. °· Gastroesophageal reflux disease (GERD). This is a digestive condition that makes stomach acid flow back into the esophagus. °· Urinary retention. This means that the body is holding onto urine, and the bladder cannot be emptied all the way. ° °Follow these instructions at home: °Eating and drinking °· Avoid eating too much. °· Try not to swallow air while talking or eating. °· Avoid eating while lying down. °· Avoid these foods and drinks: °? Foods that cause gas, such as broccoli, cabbage, cauliflower, and baked beans. °? Carbonated drinks. °? Hard candy. °? Chewing gum. °Medicines °· Take over-the-counter and prescription medicines only as told by your health care provider. °· Take probiotic medicines. These medicines contain live bacteria or yeasts that can help digestion. °· Take coated peppermint oil capsules. °Activity °· Try to exercise regularly. Exercise may help to relieve bloating that is caused by gas and relieve constipation. °General instructions °· Keep all follow-up visits as told by your health care provider. This is important. °Contact a health care provider if: °· You have nausea and vomiting. °· You have diarrhea. °· You have abdominal pain. °· You have  unusual weight loss or weight gain. °· You have severe pain, and medicines do not help. °Get help right away if: °· You have severe chest pain. °· You have trouble breathing. °· You have shortness of breath. °· You have trouble urinating. °· You have darker urine than normal. °· You have blood in your stools or have dark, tarry stools. °Summary °· Abdominal bloating means that the abdomen is swollen. °· Common causes of abdominal bloating are swallowing air, constipation, and problems digesting food. °· Avoid eating too much and avoid swallowing air. °· Avoid foods that cause gas, carbonated drinks, hard candy, and chewing gum. °This information is not intended to replace advice given to you by your health care provider. Make sure you discuss any questions you have with your health care provider. °Document Released: 11/22/2016 Document Revised: 11/22/2016 Document Reviewed: 11/22/2016 °Elsevier Interactive Patient Education © 2018 [employer] Inc. ° °

## 2018-07-16 NOTE — Progress Notes (Signed)
[facility] [place], [place] [address]  Internal MEDICINE  Office Visit Note  Patient Name: Jeremy Delgado  [id number]  [id number]  Date of Service: 07/27/2018  Chief Complaint  Patient presents with  . Abdominal Pain    still hurts pretty , pain level at 8 ,stabbing pain comes and goes   . Hypothyroidism  . Depression    HPI Pt here for follow up on abdominal pain, hypothyroid and depression.  Pt had an abdominal CT, and it was negative.  He continues to report abdominal pain, however his bowels have been normalizing.  He has an appt on oct 7th with GI to continue eval of this pain. He takes synthroid for hypothyroidism, however has not had any labs done since 2017, he was suppose to get them in February, but he forgot to go.    Current Medication: Outpatient Encounter Medications as of 07/16/2018  Medication Sig Note  . cloNIDine (CATAPRES) 0.1 MG tablet Take 1 tablet (0.1 mg total) by mouth 2 (two) times daily.   Marland Kitchen FLUoxetine (PROZAC) 40 MG capsule Take 1 capsule (40 mg total) by mouth 2 (two) times daily.   Marland Kitchen gabapentin (NEURONTIN) 600 MG tablet Take 1 tablet (600 mg total) by mouth 2 (two) times daily.   . Melatonin 3 MG CAPS Take 1 capsule by mouth at bedtime as needed (sleep).  09/27/2015: .   . promethazine (PHENERGAN) 25 MG tablet Take 1 tablet (25 mg total) by mouth every 8 (eight) hours as needed for nausea or vomiting.   . [DISCONTINUED] levothyroxine (SYNTHROID, LEVOTHROID) 50 MCG tablet Take 1 tablet (50 mcg total) by mouth every morning.   . [DISCONTINUED] omeprazole (PRILOSEC) 20 MG capsule Take 1 capsule (20 mg total) by mouth daily. (Patient taking differently: Take 20 mg by mouth 2 (two) times daily before a meal. )   . [DISCONTINUED] promethazine (PHENERGAN) 25 MG tablet Take 1 tablet (25 mg total) by mouth every 8 (eight) hours as needed for nausea or vomiting.   Marland Kitchen omeprazole (PRILOSEC) 20 MG capsule Take 1 capsule (20 mg total) by mouth 2  (two) times daily before a meal.    No facility-administered encounter medications on file as of 07/16/2018.     Surgical History: Past Surgical History:  Procedure Laterality Date  . HEMORRHOID SURGERY      Medical History: Past Medical History:  Diagnosis Date  . Addiction to drug ([place]) 04/06/2015  . ANXIETY 03/11/2008   Qualifier: Diagnosis of  By: Hassell Done FNP, Tori Milks     . Arthritis   . BACK PAIN 02/17/2007   Qualifier: Diagnosis of  By: Silvio Pate MD, [facility]   . Chronic pain   . Colitis    self  . DEPRESSION 11/05/2003   Qualifier: Diagnosis of  By: Hassell Done FNP, Tori Milks    . GERD 12/29/2008   Qualifier: Diagnosis of  By: Hassell Done FNP, Tori Milks    . H/O arthritis 02/20/2015  . H/O: hypothyroidism 02/20/2015  . HYPERCHOLESTEROLEMIA 03/11/2008   Qualifier: Diagnosis of  By: Hassell Done FNP, Tori Milks    . Hypertension   . PAIN IN JOINT PELVIC REGION AND THIGH 03/11/2008   Qualifier: Diagnosis of  By: Hassell Done FNP, Tori Milks    . Renal disorder   . Sciatica   . TOBACCO ABUSE 07/15/2008   Qualifier: Diagnosis of  By: Hassell Done FNP, Tori Milks      Family History: Family History  Problem Relation Age of Onset  . Depression Mother   .  Hypertension Father   . Heart failure Father   . Diabetes Paternal Grandfather     Social History   Socioeconomic History  . Marital status: Single    Spouse name: Not on file  . Number of children: Not on file  . Years of education: Not on file  . Highest education level: Not on file  Occupational History  . Not on file  Social Needs  . Financial resource strain: Not on file  . Food insecurity:    Worry: Not on file    Inability: Not on file  . Transportation needs:    Medical: Not on file    Non-medical: Not on file  Tobacco Use  . Smoking status: Current Every Day Smoker    Packs/day: 0.50    Types: Cigarettes    Start date: 07/05/1990  . Smokeless tobacco: Never Used  . Tobacco comment: pt currently uses a E-cig  Substance and Sexual Activity   . Alcohol use: Yes    Alcohol/week: 0.0 standard drinks    Comment: social   . Drug use: Yes    Frequency: 2.0 times per week    Types: Marijuana  . Sexual activity: Yes    Birth control/protection: Condom  Lifestyle  . Physical activity:    Days per week: Not on file    Minutes per session: Not on file  . Stress: Not on file  Relationships  . Social connections:    Talks on phone: Not on file    Gets together: Not on file    Attends religious service: Not on file    Active member of club or organization: Not on file    Attends meetings of clubs or organizations: Not on file    Relationship status: Not on file  . Intimate partner violence:    Fear of current or ex partner: Not on file    Emotionally abused: Not on file    Physically abused: Not on file    Forced sexual activity: Not on file  Other Topics Concern  . Not on file  Social History Narrative  . Not on file      Review of Systems  Constitutional: Negative.  Negative for chills, fatigue and unexpected weight change.  HENT: Negative.  Negative for congestion, rhinorrhea, sneezing and sore throat.   Eyes: Negative for redness.  Respiratory: Negative.  Negative for cough, chest tightness and shortness of breath.   Cardiovascular: Negative.  Negative for chest pain and palpitations.  Gastrointestinal: Positive for abdominal pain. Negative for blood in stool, constipation, diarrhea, nausea and vomiting.  Endocrine: Negative.   Genitourinary: Negative.  Negative for dysuria and frequency.  Musculoskeletal: Negative.  Negative for arthralgias, back pain, joint swelling and neck pain.  Skin: Negative.  Negative for rash.  Allergic/Immunologic: Negative.   Neurological: Negative.  Negative for tremors and numbness.  Hematological: Negative for adenopathy. Does not bruise/bleed easily.  Psychiatric/Behavioral: Negative.  Negative for behavioral problems, sleep disturbance and suicidal ideas. The patient is not  nervous/anxious.     Vital Signs: BP 119/73   Pulse 64   Temp 97.9 F (36.6 C)   Resp 16   Ht 6' (1.829 m)   Wt 174 lb (78.9 kg)   SpO2 97%   BMI 23.60 kg/m    Physical Exam  Constitutional: He is oriented to person, place, and time. He appears well-developed and well-nourished. No distress.  HENT:  Head: Normocephalic and atraumatic.  Mouth/Throat: Oropharynx is clear and moist. No  oropharyngeal exudate.  Eyes: Pupils are equal, round, and reactive to light. EOM are normal.  Neck: Normal range of motion. Neck supple. No JVD present. No tracheal deviation present. No thyromegaly present.  Cardiovascular: Normal rate, regular rhythm and normal heart sounds. Exam reveals no gallop and no friction rub.  No murmur heard. Pulmonary/Chest: Effort normal and breath sounds normal. No respiratory distress. He has no wheezes. He has no rales. He exhibits no tenderness.  Abdominal: Soft. There is no tenderness. There is no guarding.  Musculoskeletal: Normal range of motion.  Lymphadenopathy:    He has no cervical adenopathy.  Neurological: He is alert and oriented to person, place, and time. No cranial nerve deficit.  Skin: Skin is warm and dry. He is not diaphoretic.  Psychiatric: He has a normal mood and affect. His behavior is normal. Judgment and thought content normal.  Nursing note and vitals reviewed.  Assessment/Plan: 1. Gastroesophageal reflux disease without esophagitis - omeprazole (PRILOSEC) 20 MG capsule; Take 1 capsule (20 mg total) by mouth 2 (two) times daily before a meal.  Dispense: 30 capsule; Refill: 3  2. Nausea - promethazine (PHENERGAN) 25 MG tablet; Take 1 tablet (25 mg total) by mouth every 8 (eight) hours as needed for nausea or vomiting.  Dispense: 20 tablet; Refill: 0  3. Abdominal pain, unspecified abdominal location Follow up with GI as scheduled on oct 7th.  Constipation is resolving. Will follow up after GI visit.   4. Flu vaccine need - Flu Vaccine  MDCK QUAD PF  5. Hypothyroidism, unspecified type Encouraged pt to get blood drawn today or tomorrow to follow up on TSH, FT4  General Counseling: josey forcier understanding of the findings of todays visit and agrees with plan of treatment. I have discussed any further diagnostic evaluation that may be needed or ordered today. We also reviewed his medications today. he has been encouraged to call the office with any questions or concerns that should arise related to todays visit.    Orders Placed This Encounter  Procedures  . Flu Vaccine MDCK QUAD PF    Meds ordered this encounter  Medications  . omeprazole (PRILOSEC) 20 MG capsule    Sig: Take 1 capsule (20 mg total) by mouth 2 (two) times daily before a meal.    Dispense:  30 capsule    Refill:  3  . promethazine (PHENERGAN) 25 MG tablet    Sig: Take 1 tablet (25 mg total) by mouth every 8 (eight) hours as needed for nausea or vomiting.    Dispense:  20 tablet    Refill:  0    Time spent: 25 Minutes   This patient was seen by Orson Gear AGNP-C in Collaboration with Dr Lavera Guise as a part of collaborative care agreement    Dr Lavera Guise Internal medicine

## 2018-07-17 ENCOUNTER — Other Ambulatory Visit: Payer: Self-pay | Admitting: Adult Health

## 2018-07-17 DIAGNOSIS — E039 Hypothyroidism, unspecified: Secondary | ICD-10-CM

## 2018-07-17 MED ORDER — LEVOTHYROXINE SODIUM 50 MCG PO TABS: 50.0000 ug | ORAL_TABLET | Freq: Every morning | ORAL | 3 refills | Status: DC

## 2018-07-27 ENCOUNTER — Other Ambulatory Visit: Payer: Self-pay | Admitting: Adult Health

## 2018-07-27 DIAGNOSIS — I10 Essential (primary) hypertension: Secondary | ICD-10-CM

## 2018-07-27 MED ORDER — CLONIDINE HCL 0.1 MG PO TABS: 0.1000 mg | ORAL_TABLET | Freq: Two times a day (BID) | ORAL | 3 refills | Status: DC

## 2018-07-27 NOTE — Telephone Encounter (Signed)
Yes this is ok to refill.

## 2018-08-07 ENCOUNTER — Other Ambulatory Visit: Payer: Self-pay

## 2018-08-07 DIAGNOSIS — F321 Major depressive disorder, single episode, moderate: Secondary | ICD-10-CM

## 2018-08-07 MED ORDER — FLUOXETINE HCL 40 MG PO CAPS: 40.0000 mg | ORAL_CAPSULE | Freq: Two times a day (BID) | ORAL | 1 refills | Status: DC

## 2018-08-10 ENCOUNTER — Encounter: Payer: Self-pay | Admitting: Gastroenterology

## 2018-08-10 ENCOUNTER — Ambulatory Visit (INDEPENDENT_AMBULATORY_CARE_PROVIDER_SITE_OTHER): Payer: PPO | Admitting: Gastroenterology

## 2018-08-10 ENCOUNTER — Other Ambulatory Visit: Payer: Self-pay

## 2018-08-10 ENCOUNTER — Other Ambulatory Visit: Payer: Self-pay | Admitting: Adult Health

## 2018-08-10 VITALS — BP 116/67 | HR 69 | Ht 72.0 in | Wt 173.8 lb

## 2018-08-10 DIAGNOSIS — R109 Unspecified abdominal pain: Secondary | ICD-10-CM | POA: Diagnosis not present

## 2018-08-10 DIAGNOSIS — K5900 Constipation, unspecified: Secondary | ICD-10-CM | POA: Diagnosis not present

## 2018-08-10 DIAGNOSIS — F321 Major depressive disorder, single episode, moderate: Secondary | ICD-10-CM

## 2018-08-10 DIAGNOSIS — K219 Gastro-esophageal reflux disease without esophagitis: Secondary | ICD-10-CM | POA: Diagnosis not present

## 2018-08-10 DIAGNOSIS — K625 Hemorrhage of anus and rectum: Secondary | ICD-10-CM

## 2018-08-10 MED ORDER — FLUOXETINE HCL 40 MG PO CAPS: 40.0000 mg | ORAL_CAPSULE | Freq: Two times a day (BID) | ORAL | 1 refills | Status: DC

## 2018-08-10 MED ORDER — OMEPRAZOLE 20 MG PO CPDR: 40.0000 mg | DELAYED_RELEASE_CAPSULE | Freq: Two times a day (BID) | ORAL | 0 refills | Status: DC

## 2018-08-10 NOTE — Progress Notes (Signed)
Jonathon Bellows MD, MRCP(U.K) [address]  [place], [place] [address]  Main: [phone]  Fax: [phone]   Gastroenterology Consultation  Referring Provider:     Kendell Bane, NP Primary Care Physician:  Ronnell Freshwater, NP Primary Gastroenterologist:  Dr. Jonathon Bellows  Reason for Consultation:    Abdominal pain          HPI:   Jeremy Delgado is a 47 y.o. y/o male referred for consultation & management  by Dr. Ronnell Freshwater, NP.    He has been referred for abdominal pain, constipation and nausea.  CT scan of the abdomen on 07/09/2018 showed moderate stool throughout the colon.  Reticulocyte steatosis.  Minimal ventral hernia containing only fat.  Recent labs.  Constipation treated by MiraLAX after his recent pulmonary visit.  Guaiac checked in office apparently was positive.   Abdominal pain: Onset:  1 month , no change, every day , on and off , each episode, lasts minutes, during a day gets about 6 episodes  Site :mostlt on the left side and in the epigastrium  Radiation: left sided pain goes to his groin Severity :severe  Petra Kuba of pain: sharp  Aggravating factors: stress , not affected by eating  Relieving factors :yes  Weight loss: no  NSAID use:   No  PPI use :omeprazole daily - helps  Gall bladder surgery: no  Frequency of bowel movements: every 3 days  Change in bowel movements: no  Relief with bowel movements: yes    Also has nausea  Never had a colonoscopy , has noticed some blood in his stool.   Smokes weed.  Past Medical History:  Diagnosis Date  . Addiction to drug ([place]) 04/06/2015  . ANXIETY 03/11/2008   Qualifier: Diagnosis of  By: Hassell Done FNP, Tori Milks     . Arthritis   . BACK PAIN 02/17/2007   Qualifier: Diagnosis of  By: Silvio Pate MD, [facility]aird Cancer   . Chronic pain   . Colitis    self  . DEPRESSION 11/05/2003   Qualifier: Diagnosis of  By: Hassell Done FNP, Tori Milks    . GERD 12/29/2008   Qualifier: Diagnosis of  By: Hassell Done FNP, Tori Milks     . H/O arthritis 02/20/2015  . H/O: hypothyroidism 02/20/2015  . HYPERCHOLESTEROLEMIA 03/11/2008   Qualifier: Diagnosis of  By: Hassell Done FNP, Tori Milks    . Hypertension   . PAIN IN JOINT PELVIC REGION AND THIGH 03/11/2008   Qualifier: Diagnosis of  By: Hassell Done FNP, Tori Milks    . Renal disorder   . Sciatica   . TOBACCO ABUSE 07/15/2008   Qualifier: Diagnosis of  By: Hassell Done FNP, Tori Milks      Past Surgical History:  Procedure Laterality Date  . HEMORRHOID SURGERY      Prior to Admission medications   Medication Sig Start Date End Date Taking? Authorizing Provider  cloNIDine (CATAPRES) 0.1 MG tablet Take 1 tablet (0.1 mg total) by mouth 2 (two) times daily. 07/27/18   Kendell Bane, NP  FLUoxetine (PROZAC) 40 MG capsule Take 1 capsule (40 mg total) by mouth 2 (two) times daily. 08/07/18   Kendell Bane, NP  gabapentin (NEURONTIN) 600 MG tablet Take 1 tablet (600 mg total) by mouth 2 (two) times daily. 06/05/18   Kendell Bane, NP  levothyroxine (SYNTHROID, LEVOTHROID) 50 MCG tablet Take 1 tablet (50 mcg total) by mouth every morning. 07/17/18   Kendell Bane, NP  Melatonin 3 MG CAPS Take 1  capsule by mouth at bedtime as needed (sleep).     [provider]  omeprazole (PRILOSEC) 20 MG capsule Take 1 capsule (20 mg total) by mouth 2 (two) times daily before a meal. 07/16/18   Scarboro, Audie Clear, NP  promethazine (PHENERGAN) 25 MG tablet Take 1 tablet (25 mg total) by mouth every 8 (eight) hours as needed for nausea or vomiting. 07/16/18   Kendell Bane, NP    Family History  Problem Relation Age of Onset  . Depression Mother   . Hypertension Father   . Heart failure Father   . Diabetes Paternal Grandfather      Social History   Tobacco Use  . Smoking status: Current Every Day Smoker    Packs/day: 0.50    Types: Cigarettes    Start date: 07/05/1990  . Smokeless tobacco: Never Used  . Tobacco comment: pt currently uses a E-cig  Substance Use Topics  . Alcohol use: Yes     Alcohol/week: 0.0 standard drinks    Comment: social   . Drug use: Yes    Frequency: 2.0 times per week    Types: Marijuana    Allergies as of 08/10/2018 - Review Complete 07/16/2018  Allergen Reaction Noted  . Bee pollen Anaphylaxis 01/04/2014  . Codeine Itching and Nausea Only 11/27/2007  . Prednisone Other (See Comments) 03/11/2008    Review of Systems:    All systems reviewed and negative except where noted in HPI.   Physical Exam:  There were no vitals taken for this visit. No LMP for male patient. Psych:  Alert and cooperative. Normal mood and affect. General:   Alert,  Well-developed, well-nourished, pleasant and cooperative in NAD Head:  Normocephalic and atraumatic. Eyes:  Sclera clear, no icterus.   Conjunctiva pink. Ears:  Normal auditory acuity. Nose:  No deformity, discharge, or lesions. Mouth:  No deformity or lesions,oropharynx pink & moist. Neck:  Supple; no masses or thyromegaly. Lungs:  Respirations even and unlabored.  Clear throughout to auscultation.   No wheezes, crackles, or rhonchi. No acute distress. Heart:  Regular rate and rhythm; no murmurs, clicks, rubs, or gallops. Abdomen:  Normal bowel sounds.  No bruits.  Soft, non-tender and non-distended without masses, hepatosplenomegaly or hernias noted.  No guarding or rebound tenderness.    Msk:  Symmetrical without gross deformities. Good, equal movement & strength bilaterally. Pulses:  Normal pulses noted. Extremities:  No clubbing or edema.  No cyanosis. Neurologic:  Alert and oriented x3;  grossly normal neurologically. Skin:  Intact without significant lesions or rashes. No jaundice. Lymph Nodes:  No significant cervical adenopathy. Psych:  Alert and cooperative. Normal mood and affect.  Imaging Studies: No results found.  Assessment and Plan:   Jeremy Delgado is a 47 y.o. y/o male has been referred for abdominal pain and rectal bleeding . Likely constipation and smoking weed leading to nausea  and abdominal pain. With rectal bleeding will evaluate furtjer   Plan 1.  Check CBC, CMP, TSH. 2.  Check celiac serology.Breath test for H pylori  3.  EGD plus colonoscopy.  EGD for abdominal pain.  Colonoscopy because stool occult test was positive. 4.  Commence on Linzess to 290 mcg a day .  2-week sample provided 5.  High-fiber diet. 6. Stop smoking nicotine and weed as it can cause nausea.   I have discussed alternative options, risks & benefits,  which include, but are not limited to, bleeding, infection, perforation,respiratory complication & drug reaction.  The  patient agrees with this plan & written consent will be obtained.      Follow up in 4 weeks   Dr Jonathon Bellows MD,MRCP(U.K)

## 2018-08-11 LAB — COMPREHENSIVE METABOLIC PANEL
A/G RATIO: 1.9 (ref 1.2–2.2)
ALT: 31 IU/L (ref 0–44)
AST: 35 IU/L (ref 0–40)
Albumin: 4.1 g/dL (ref 3.5–5.5)
Alkaline Phosphatase: 64 IU/L (ref 39–117)
BUN/Creatinine Ratio: 14 (ref 9–20)
BUN: 11 mg/dL (ref 6–24)
Bilirubin Total: <0.2 mg/dL (ref 0.0–1.2)
CHLORIDE: 99 mmol/L (ref 96–106)
CO2: 25 mmol/L (ref 20–29)
Calcium: 9 mg/dL (ref 8.7–10.2)
Creatinine, Ser: 0.76 mg/dL (ref 0.76–1.27)
GFR, EST AFRICAN AMERICAN: 126 mL/min/{1.73_m2} (ref >59)
GFR, EST NON AFRICAN AMERICAN: 109 mL/min/{1.73_m2} (ref >59)
GLOBULIN, TOTAL: 2.2 g/dL (ref 1.5–4.5)
Glucose: 83 mg/dL (ref 65–99)
POTASSIUM: 4.7 mmol/L (ref 3.5–5.2)
SODIUM: 137 mmol/L (ref 134–144)
TOTAL PROTEIN: 6.3 g/dL (ref 6.0–8.5)

## 2018-08-11 LAB — CBC WITH DIFFERENTIAL/PLATELET
BASOS: 0 %
Basophils Absolute: 0 10*3/uL (ref 0.0–0.2)
EOS (ABSOLUTE): 0.2 10*3/uL (ref 0.0–0.4)
EOS: 3 %
HEMATOCRIT: 37.1 % — AB (ref 37.5–51.0)
Hemoglobin: 12.7 g/dL — ABNORMAL LOW (ref 13.0–17.7)
Immature Grans (Abs): 0 10*3/uL (ref 0.0–0.1)
Immature Granulocytes: 0 %
Lymphocytes Absolute: 1.7 10*3/uL (ref 0.7–3.1)
Lymphs: 32 %
MCH: 30.6 pg (ref 26.6–33.0)
MCHC: 34.2 g/dL (ref 31.5–35.7)
MCV: 89 fL (ref 79–97)
MONOS ABS: 0.5 10*3/uL (ref 0.1–0.9)
Monocytes: 10 %
NEUTROS ABS: 2.8 10*3/uL (ref 1.4–7.0)
Neutrophils: 55 %
PLATELETS: 291 10*3/uL (ref 150–450)
RBC: 4.15 x10E6/uL (ref 4.14–5.80)
RDW: 12.6 % (ref 12.3–15.4)
WBC: 5.1 10*3/uL (ref 3.4–10.8)

## 2018-08-11 LAB — CELIAC DISEASE AB SCREEN W/RFX
Antigliadin Abs, IgA: 8 units (ref 0–19)
IgA/Immunoglobulin A, Serum: 348 mg/dL (ref 90–386)

## 2018-08-11 LAB — TSH: TSH: 2.37 u[IU]/mL (ref 0.450–4.500)

## 2018-08-12 LAB — H. PYLORI BREATH TEST: H pylori Breath Test: NEGATIVE

## 2018-08-14 ENCOUNTER — Ambulatory Visit
Admission: RE | Admit: 2018-08-14 | Discharge: 2018-08-14 | Disposition: A | Discharge status: Home / Self Care | Payer: PPO | Source: Ambulatory Visit | Attending: Gastroenterology | Admitting: Gastroenterology

## 2018-08-14 ENCOUNTER — Ambulatory Visit: Payer: PPO | Admitting: Anesthesiology

## 2018-08-14 ENCOUNTER — Encounter: Payer: Self-pay | Admitting: *Deleted

## 2018-08-14 ENCOUNTER — Encounter
Admission: RE | Disposition: A | Discharge status: Home / Self Care | Payer: Self-pay | Source: Ambulatory Visit | Attending: Gastroenterology

## 2018-08-14 DIAGNOSIS — K5730 Diverticulosis of large intestine without perforation or abscess without bleeding: Secondary | ICD-10-CM | POA: Diagnosis not present

## 2018-08-14 DIAGNOSIS — K640 First degree hemorrhoids: Secondary | ICD-10-CM | POA: Insufficient documentation

## 2018-08-14 DIAGNOSIS — N289 Disorder of kidney and ureter, unspecified: Secondary | ICD-10-CM | POA: Insufficient documentation

## 2018-08-14 DIAGNOSIS — K219 Gastro-esophageal reflux disease without esophagitis: Secondary | ICD-10-CM | POA: Diagnosis not present

## 2018-08-14 DIAGNOSIS — G709 Myoneural disorder, unspecified: Secondary | ICD-10-CM | POA: Insufficient documentation

## 2018-08-14 DIAGNOSIS — K635 Polyp of colon: Secondary | ICD-10-CM | POA: Diagnosis not present

## 2018-08-14 DIAGNOSIS — K648 Other hemorrhoids: Secondary | ICD-10-CM | POA: Diagnosis not present

## 2018-08-14 DIAGNOSIS — Z885 Allergy status to narcotic agent status: Secondary | ICD-10-CM | POA: Insufficient documentation

## 2018-08-14 DIAGNOSIS — E7800 Pure hypercholesterolemia, unspecified: Secondary | ICD-10-CM | POA: Insufficient documentation

## 2018-08-14 DIAGNOSIS — E039 Hypothyroidism, unspecified: Secondary | ICD-10-CM | POA: Insufficient documentation

## 2018-08-14 DIAGNOSIS — K5900 Constipation, unspecified: Secondary | ICD-10-CM

## 2018-08-14 DIAGNOSIS — G8929 Other chronic pain: Secondary | ICD-10-CM | POA: Insufficient documentation

## 2018-08-14 DIAGNOSIS — M1990 Unspecified osteoarthritis, unspecified site: Secondary | ICD-10-CM | POA: Insufficient documentation

## 2018-08-14 DIAGNOSIS — Z888 Allergy status to other drugs, medicaments and biological substances status: Secondary | ICD-10-CM | POA: Diagnosis not present

## 2018-08-14 DIAGNOSIS — R1013 Epigastric pain: Secondary | ICD-10-CM | POA: Diagnosis present

## 2018-08-14 DIAGNOSIS — I10 Essential (primary) hypertension: Secondary | ICD-10-CM | POA: Diagnosis not present

## 2018-08-14 DIAGNOSIS — M549 Dorsalgia, unspecified: Secondary | ICD-10-CM | POA: Insufficient documentation

## 2018-08-14 DIAGNOSIS — D122 Benign neoplasm of ascending colon: Secondary | ICD-10-CM | POA: Diagnosis not present

## 2018-08-14 DIAGNOSIS — R109 Unspecified abdominal pain: Secondary | ICD-10-CM | POA: Diagnosis not present

## 2018-08-14 DIAGNOSIS — K625 Hemorrhage of anus and rectum: Secondary | ICD-10-CM

## 2018-08-14 DIAGNOSIS — Z7982 Long term (current) use of aspirin: Secondary | ICD-10-CM | POA: Diagnosis not present

## 2018-08-14 DIAGNOSIS — F329 Major depressive disorder, single episode, unspecified: Secondary | ICD-10-CM | POA: Diagnosis not present

## 2018-08-14 DIAGNOSIS — Z79899 Other long term (current) drug therapy: Secondary | ICD-10-CM | POA: Insufficient documentation

## 2018-08-14 DIAGNOSIS — Z91030 Bee allergy status: Secondary | ICD-10-CM | POA: Diagnosis not present

## 2018-08-14 DIAGNOSIS — F419 Anxiety disorder, unspecified: Secondary | ICD-10-CM | POA: Diagnosis not present

## 2018-08-14 DIAGNOSIS — M5430 Sciatica, unspecified side: Secondary | ICD-10-CM | POA: Insufficient documentation

## 2018-08-14 DIAGNOSIS — F17210 Nicotine dependence, cigarettes, uncomplicated: Secondary | ICD-10-CM | POA: Diagnosis not present

## 2018-08-14 DIAGNOSIS — K2970 Gastritis, unspecified, without bleeding: Secondary | ICD-10-CM | POA: Insufficient documentation

## 2018-08-14 DIAGNOSIS — Z8249 Family history of ischemic heart disease and other diseases of the circulatory system: Secondary | ICD-10-CM | POA: Insufficient documentation

## 2018-08-14 DIAGNOSIS — Z818 Family history of other mental and behavioral disorders: Secondary | ICD-10-CM | POA: Diagnosis not present

## 2018-08-14 DIAGNOSIS — Z833 Family history of diabetes mellitus: Secondary | ICD-10-CM | POA: Diagnosis not present

## 2018-08-14 HISTORY — PX: COLONOSCOPY WITH PROPOFOL: SHX5780

## 2018-08-14 HISTORY — PX: ESOPHAGOGASTRODUODENOSCOPY (EGD) WITH PROPOFOL: SHX5813

## 2018-08-14 SURGERY — COLONOSCOPY WITH PROPOFOL
Anesthesia: General

## 2018-08-14 MED ORDER — SODIUM CHLORIDE 0.9 % IV SOLN
INTRAVENOUS | Status: DC
  Administered 2018-08-14: 11:00:00 via INTRAVENOUS

## 2018-08-14 MED ORDER — PROPOFOL 10 MG/ML IV BOLUS
INTRAVENOUS | Status: DC | PRN
  Administered 2018-08-14: 59 mg via INTRAVENOUS

## 2018-08-14 MED ORDER — LACTATED RINGERS IV SOLN
INTRAVENOUS | Status: DC | PRN
  Administered 2018-08-14: 13:00:00 via INTRAVENOUS

## 2018-08-14 MED ORDER — PROPOFOL 500 MG/50ML IV EMUL
INTRAVENOUS | Status: AC
  Filled 2018-08-14: qty 50

## 2018-08-14 NOTE — Anesthesia Preprocedure Evaluation (Signed)
Anesthesia Evaluation  Patient identified by MRN, date of birth, ID band Patient awake    Reviewed: Allergy & Precautions, NPO status , Patient's Chart, lab work & pertinent test results  Airway Mallampati: II       Dental   Pulmonary Current Smoker,    Pulmonary exam normal        Cardiovascular hypertension, Normal cardiovascular exam     Neuro/Psych PSYCHIATRIC DISORDERS Anxiety Depression  Neuromuscular disease    GI/Hepatic Neg liver ROS, GERD  ,  Endo/Other  Hypothyroidism   Renal/GU Renal disease     Musculoskeletal  (+) Arthritis , Osteoarthritis,    Abdominal Normal abdominal exam  (+)   Peds negative pediatric ROS (+)  Hematology negative hematology ROS (+)   Anesthesia Other Findings   Reproductive/Obstetrics                             Anesthesia Physical Anesthesia Plan  ASA: II  Anesthesia Plan: General   Post-op Pain Management:    Induction: Intravenous  PONV Risk Score and Plan:   Airway Management Planned: Nasal Cannula  Additional Equipment:   Intra-op Plan:   Post-operative Plan:   Informed Consent: I have reviewed the patients History and Physical, chart, labs and discussed the procedure including the risks, benefits and alternatives for the proposed anesthesia with the patient or authorized representative who has indicated his/her understanding and acceptance.   Dental advisory given  Plan Discussed with: CRNA and Surgeon  Anesthesia Plan Comments:         Anesthesia Quick Evaluation

## 2018-08-14 NOTE — Anesthesia Postprocedure Evaluation (Signed)
Anesthesia Post Note  Patient: Jeremy Delgado  Procedure(s) Performed: COLONOSCOPY WITH PROPOFOL (N/A ) ESOPHAGOGASTRODUODENOSCOPY (EGD) WITH PROPOFOL (N/A )  Patient location during evaluation: Endoscopy Anesthesia Type: General Level of consciousness: awake and alert Pain management: pain level controlled Vital Signs Assessment: post-procedure vital signs reviewed and stable Respiratory status: spontaneous breathing, nonlabored ventilation, respiratory function stable and patient connected to nasal cannula oxygen Cardiovascular status: blood pressure returned to baseline and stable Postop Assessment: no apparent nausea or vomiting Anesthetic complications: no     Last Vitals:  Vitals:   08/14/18 1039 08/14/18 1325  BP: (!) 137/97 121/77  Pulse: 68 87  Resp: 16 13  Temp: (!) 35.7 C (!) 35.8 C  SpO2: 99% 99%    Last Pain:  Vitals:   08/14/18 1325  TempSrc: Tympanic  PainSc: 0-No pain                 [name]

## 2018-08-14 NOTE — Op Note (Addendum)
[facility] Patient Name: Jeremy Delgado Procedure Date: 08/14/2018 12:25 PM MRN: [id number] Account #: [id number] Date of Birth: 1971-05-03 Admit Type: Outpatient Age: 47 Room: [facility] ENDO ROOM 4 Gender: Male Note Status: Finalized Procedure:            Colonoscopy Indications:          Rectal bleeding Providers:            Jonathon Bellows MD, MD Referring MD:         Noberto Retort MD, MD (Referring MD), No Local Md, MD                        (Referring MD) Medicines:            Monitored Anesthesia Care Complications:        No immediate complications. Procedure:            Pre-Anesthesia Assessment:                       - Prior to the procedure, a History and Physical was                        performed, and patient medications, allergies and                        sensitivities were reviewed. The patient's tolerance of                        previous anesthesia was reviewed.                       - The risks and benefits of the procedure and the                        sedation options and risks were discussed with the                        patient. All questions were answered and informed                        consent was obtained.                       - ASA Grade Assessment: III - A patient with severe                        systemic disease.                       After obtaining informed consent, the colonoscope was                        passed under direct vision. Throughout the procedure,                        the patient's blood pressure, pulse, and oxygen                        saturations were monitored continuously. The                        Colonoscope was  introduced through the anus and                        advanced to the the cecum, identified by the                        appendiceal orifice, IC valve and transillumination.                        The colonoscopy was performed with ease. The patient   tolerated the procedure well. The quality of the bowel                        preparation was good. Findings:      The perianal and digital rectal examinations were normal.      Non-bleeding internal hemorrhoids were found during retroflexion. The       hemorrhoids were medium-sized and Grade I (internal hemorrhoids that do       not prolapse).      Two sessile polyps were found in the ascending colon. The polyps were 3       to 4 mm in size. These polyps were removed with a cold biopsy forceps.       Resection and retrieval were complete.      The exam was otherwise without abnormality on direct and retroflexion       views.      Multiple small-mouthed diverticula were found in the entire colon. Impression:           - Non-bleeding internal hemorrhoids.                       - Two 3 to 4 mm polyps in the ascending colon, removed                        with a cold biopsy forceps. Resected and retrieved.                       - The examination was otherwise normal on direct and                        retroflexion views.                       - Diverticulosis in the entire examined colon. Recommendation:       - Await pathology results.                       - Perform an upper GI endoscopy today. Procedure Code(s):    --- Professional ---                       [phone], Colonoscopy, flexible; with biopsy, single or                        multiple Diagnosis Code(s):    --- Professional ---                       D12.2, Benign neoplasm of ascending colon                       K64.0, First degree hemorrhoids  K62.5, Hemorrhage of anus and rectum                       K57.30, Diverticulosis of large intestine without                        perforation or abscess without bleeding CPT copyright 2018 American Medical Association. All rights reserved. The codes documented in this report are preliminary and upon coder review may  be revised to meet current compliance  requirements. Jonathon Bellows, MD Jonathon Bellows MD, MD 08/14/2018 1:02:55 PM This report has been signed electronically. Number of Addenda: 0 Note Initiated On: 08/14/2018 12:25 PM Scope Withdrawal Time: 0 hours 10 minutes 8 seconds  Total Procedure Duration: 0 hours 14 minutes 35 seconds       [facility]

## 2018-08-14 NOTE — H&P (Signed)
Jonathon Bellows, MD [address], [place], [place], [place], [address] [address], [place], [place], [place], [address] Phone: [phone]  Fax: [phone]  Primary Care Physician:  Ronnell Freshwater, NP   Pre-Procedure History & Physical: HPI:  Jeremy Delgado is a 47 y.o. male is here for an endoscopy and colonoscopy    Past Medical History:  Diagnosis Date  . Addiction to drug ([place]) 04/06/2015  . ANXIETY 03/11/2008   Qualifier: Diagnosis of  By: Hassell Done FNP, Tori Milks     . Arthritis   . BACK PAIN 02/17/2007   Qualifier: Diagnosis of  By: Silvio Pate MD, [facility] Cancer   . Chronic pain   . Colitis    self  . DEPRESSION 11/05/2003   Qualifier: Diagnosis of  By: Hassell Done FNP, Tori Milks    . GERD 12/29/2008   Qualifier: Diagnosis of  By: Hassell Done FNP, Tori Milks    . H/O arthritis 02/20/2015  . H/O: hypothyroidism 02/20/2015  . HYPERCHOLESTEROLEMIA 03/11/2008   Qualifier: Diagnosis of  By: Hassell Done FNP, Tori Milks    . Hypertension   . PAIN IN JOINT PELVIC REGION AND THIGH 03/11/2008   Qualifier: Diagnosis of  By: Hassell Done FNP, Tori Milks    . Renal disorder   . Sciatica   . TOBACCO ABUSE 07/15/2008   Qualifier: Diagnosis of  By: Hassell Done FNP, Tori Milks      Past Surgical History:  Procedure Laterality Date  . HEMORRHOID SURGERY      Prior to Admission medications   Medication Sig Start Date End Date Taking? Authorizing Provider  aspirin EC 81 MG tablet Take by mouth. 02/19/17  Yes [provider]  cloNIDine (CATAPRES) 0.1 MG tablet Take 1 tablet (0.1 mg total) by mouth 2 (two) times daily. 07/27/18  Yes Scarboro, Audie Clear, NP  cyclobenzaprine (FLEXERIL) 10 MG tablet TAKE 1 TABLET (10 MG TOTAL) BY MOUTH 2 (TWO) TIMES DAILY AS NEEDED (PAIN) 07/27/18  Yes [provider]  FLUoxetine (PROZAC) 40 MG capsule Take 1 capsule (40 mg total) by mouth 2 (two) times daily. 08/10/18  Yes Scarboro, Audie Clear, NP  gabapentin (NEURONTIN) 600 MG tablet Take 1 tablet (600 mg total) by mouth 2 (two) times  daily. 06/05/18  Yes Scarboro, Audie Clear, NP  levothyroxine (SYNTHROID, LEVOTHROID) 50 MCG tablet Take 1 tablet (50 mcg total) by mouth every morning. 07/17/18  Yes Scarboro, Audie Clear, NP  omeprazole (PRILOSEC) 20 MG capsule Take 2 capsules (40 mg total) by mouth 2 (two) times daily before a meal. 08/10/18 10/10/18 Yes Jonathon Bellows, MD  Melatonin 3 MG CAPS Take 1 capsule by mouth at bedtime as needed (sleep).     [provider]  promethazine (PHENERGAN) 25 MG tablet Take 1 tablet (25 mg total) by mouth every 8 (eight) hours as needed for nausea or vomiting. 07/16/18   Kendell Bane, NP    Allergies as of 08/10/2018 - Review Complete 08/10/2018  Allergen Reaction Noted  . Bee pollen Anaphylaxis 01/04/2014  . Codeine Itching and Nausea Only 11/27/2007  . Prednisone Other (See Comments) 03/11/2008    Family History  Problem Relation Age of Onset  . Depression Mother   . Hypertension Father   . Heart failure Father   . Diabetes Paternal Grandfather     Social History   Socioeconomic History  . Marital status: Single    Spouse name: Not on file  . Number of children: Not on file  . Years of education: Not on file  .  Highest education level: Not on file  Occupational History  . Not on file  Social Needs  . Financial resource strain: Not on file  . Food insecurity:    Worry: Not on file    Inability: Not on file  . Transportation needs:    Medical: Not on file    Non-medical: Not on file  Tobacco Use  . Smoking status: Current Every Day Smoker    Packs/day: 0.50    Types: Cigarettes    Start date: 07/05/1990  . Smokeless tobacco: Never Used  . Tobacco comment: pt currently uses a E-cig  Substance and Sexual Activity  . Alcohol use: Yes    Alcohol/week: 0.0 standard drinks    Comment: social   . Drug use: Yes    Frequency: 2.0 times per week    Types: Marijuana  . Sexual activity: Yes    Birth control/protection: Condom  Lifestyle  . Physical activity:    Days per  week: Not on file    Minutes per session: Not on file  . Stress: Not on file  Relationships  . Social connections:    Talks on phone: Not on file    Gets together: Not on file    Attends religious service: Not on file    Active member of club or organization: Not on file    Attends meetings of clubs or organizations: Not on file    Relationship status: Not on file  . Intimate partner violence:    Fear of current or ex partner: Not on file    Emotionally abused: Not on file    Physically abused: Not on file    Forced sexual activity: Not on file  Other Topics Concern  . Not on file  Social History Narrative  . Not on file    Review of Systems: See HPI, otherwise negative ROS  Physical Exam: BP (!) 137/97   Pulse 68   Temp (!) 96.3 F (35.7 C) (Tympanic)   Resp 16   Ht 6' (1.829 m)   Wt 79.4 kg   SpO2 99%   BMI 23.73 kg/m  General:   Alert,  pleasant and cooperative in NAD Head:  Normocephalic and atraumatic. Neck:  Supple; no masses or thyromegaly. Lungs:  Clear throughout to auscultation, normal respiratory effort.    Heart:  +S1, +S2, Regular rate and rhythm, No edema. Abdomen:  Soft, nontender and nondistended. Normal bowel sounds, without guarding, and without rebound.   Neurologic:  Alert and  oriented x4;  grossly normal neurologically.  Impression/Plan: Jeremy Delgado is here for an endoscopy and colonoscopy  to be performed for  evaluation of abdominal pain and rectal bleeding     Risks, benefits, limitations, and alternatives regarding endoscopy have been reviewed with the patient.  Questions have been answered.  All parties agreeable.   Jonathon Bellows, MD  08/14/2018, 12:19 PM

## 2018-08-14 NOTE — Op Note (Addendum)
[facility] Gastroenterology Patient Name: Jeremy Delgado Procedure Date: 08/14/2018 12:28 PM MRN: [id number] Account #: [id number] Date of Birth: [birth year] Admit Type: Outpatient Age: 47 Room: [facility] ENDO ROOM 4 Gender: Male Note Status: Finalized Procedure:            Upper GI endoscopy Indications:          Epigastric abdominal pain Providers:            Jonathon Bellows MD, MD Referring MD:         No Local Md, MD (Referring MD) Medicines:            Monitored Anesthesia Care Complications:        No immediate complications. Procedure:            Pre-Anesthesia Assessment:                       - Prior to the procedure, a History and Physical was                        performed, and patient medications, allergies and                        sensitivities were reviewed. The patient's tolerance of                        previous anesthesia was reviewed.                       - The risks and benefits of the procedure and the                        sedation options and risks were discussed with the                        patient. All questions were answered and informed                        consent was obtained.                       - ASA Grade Assessment: III - A patient with severe                        systemic disease.                       After obtaining informed consent, the endoscope was                        passed under direct vision. Throughout the procedure,                        the patient's blood pressure, pulse, and oxygen                        saturations were monitored continuously. The Endoscope                        was introduced through the mouth, and advanced to the  third part of duodenum. The upper GI endoscopy was                        accomplished with ease. The patient tolerated the                        procedure well. Findings:      The examined esophagus was normal.      The examined duodenum was  normal.      Localized moderate inflammation characterized by congestion (edema) and       erythema was found in the gastric body. Biopsies were taken with a cold       forceps for histology.      The cardia and gastric fundus were normal on retroflexion. Impression:           - Normal esophagus.                       - Normal examined duodenum.                       - Gastritis. Biopsied. Recommendation:       - Await pathology results.                       - Discharge patient to home (with escort).                       - Resume previous diet.                       - Continue present medications.                       - Return to my office in 4 weeks. Procedure Code(s):    --- Professional ---                       [phone], Esophagogastroduodenoscopy, flexible, transoral;                        with biopsy, single or multiple Diagnosis Code(s):    --- Professional ---                       K29.70, Gastritis, unspecified, without bleeding                       R10.13, Epigastric pain CPT copyright 2018 American Medical Association. All rights reserved. The codes documented in this report are preliminary and upon coder review may  be revised to meet current compliance requirements. Jonathon Bellows, MD Jonathon Bellows MD, MD 08/14/2018 1:21:54 PM This report has been signed electronically. Number of Addenda: 0 Note Initiated On: 08/14/2018 12:28 PM      [facility]

## 2018-08-14 NOTE — Anesthesia Post-op Follow-up Note (Signed)
Anesthesia QCDR form completed.        

## 2018-08-14 NOTE — Transfer of Care (Signed)
Immediate Anesthesia Transfer of Care Note  Patient: Jeremy Delgado  Procedure(s) Performed: COLONOSCOPY WITH PROPOFOL (N/A ) ESOPHAGOGASTRODUODENOSCOPY (EGD) WITH PROPOFOL (N/A )  Patient Location: PACU and Endoscopy Unit  Anesthesia Type:General  Level of Consciousness: awake and alert   Airway & Oxygen Therapy: Patient Spontanous Breathing  Post-op Assessment: Report given to RN  Post vital signs: Reviewed and stable  Last Vitals:  Vitals Value Taken Time  BP    Temp    Pulse    Resp    SpO2      Last Pain:  Vitals:   08/14/18 1039  TempSrc: Tympanic         Complications: No apparent anesthesia complications

## 2018-08-17 LAB — SURGICAL PATHOLOGY

## 2018-09-05 ENCOUNTER — Other Ambulatory Visit: Payer: Self-pay | Admitting: Nurse Practitioner

## 2018-09-05 DIAGNOSIS — G8929 Other chronic pain: Secondary | ICD-10-CM

## 2018-09-06 ENCOUNTER — Encounter: Payer: Self-pay | Admitting: Gastroenterology

## 2018-09-08 ENCOUNTER — Ambulatory Visit (INDEPENDENT_AMBULATORY_CARE_PROVIDER_SITE_OTHER): Payer: PPO | Admitting: Gastroenterology

## 2018-09-08 ENCOUNTER — Encounter: Payer: Self-pay | Admitting: Gastroenterology

## 2018-09-08 VITALS — BP 132/76 | HR 61 | Ht 72.0 in | Wt 171.2 lb

## 2018-09-08 DIAGNOSIS — K5900 Constipation, unspecified: Secondary | ICD-10-CM

## 2018-09-08 DIAGNOSIS — R109 Unspecified abdominal pain: Secondary | ICD-10-CM

## 2018-09-08 MED ORDER — LINACLOTIDE 290 MCG PO CAPS: 290.0000 ug | ORAL_CAPSULE | Freq: Every day | ORAL | 2 refills | Status: DC

## 2018-09-08 NOTE — Progress Notes (Signed)
Jonathon Bellows MD, MRCP(U.K) [address]  [place]  [place], [place] [address]  Main: [phone]  Fax: [phone]   Primary Care Physician: Ronnell Freshwater, NP  Primary Gastroenterologist:  Dr. Jonathon Bellows   No chief complaint on file.   HPI: Jeremy Delgado is a 47 y.o. male   Summary of history :  He is here today to see me for a follow-up.  He was initially seen and referred on 08/10/2018 for abdominal pain, constipation, nausea.CT scan of the abdomen on 07/09/2018 showed moderate stool throughout the colon. Minimal ventral hernia containing only fat.   Abdominal pain was ongoing for about a month with no progression occurred every day on and off and eats episode lasting a few minutes.  He would get about 6 episodes a day.  Mostly in the left side of his abdomen and epigastrium.  Radiated to his groin at times.  Severe in intensity.  Sharp in nature.  Not affected by eating much relief after bowel movements and he had a bowel movement once every 3 days.  PPI use helped his pain.  He also noted some blood in his stools.  Interval history 08/10/2018 to 09/08/2018  08/10/2018: TSH normal, hemo-globin 12.7 g, CMP normal, celiac serology negative, H. pylori breath test negative  08/14/2018: EGD: Antral gastritis noted, biopsies of the antrum showed no evidence of gastritis.  Colonoscopy: 2 diminutive polyps resected, medium-size internal hemorrhoids noted.  Pathology report demonstrated that the polyps were benign.   Took the linzess which he says helped the bowel movements well.   Still smoking cigarettes and marijuana. Hot showers help with the symptoms.   Still has left sided abdominal pain that is mild on and off.    Current Outpatient Medications  Medication Sig Dispense Refill  . aspirin EC 81 MG tablet Take by mouth.    . cloNIDine (CATAPRES) 0.1 MG tablet Take 1 tablet (0.1 mg total) by mouth 2 (two) times daily. 60 tablet 3  . cyclobenzaprine (FLEXERIL) 10 MG  tablet TAKE 1 TABLET (10 MG TOTAL) BY MOUTH 2 (TWO) TIMES DAILY AS NEEDED (PAIN)  3  . FLUoxetine (PROZAC) 40 MG capsule Take 1 capsule (40 mg total) by mouth 2 (two) times daily. 90 capsule 1  . gabapentin (NEURONTIN) 600 MG tablet TAKE 1 TABLET BY MOUTH TWICE A DAY 60 tablet 1  . levothyroxine (SYNTHROID, LEVOTHROID) 50 MCG tablet Take 1 tablet (50 mcg total) by mouth every morning. 30 tablet 3  . Melatonin 3 MG CAPS Take 1 capsule by mouth at bedtime as needed (sleep).     Marland Kitchen omeprazole (PRILOSEC) 20 MG capsule Take 2 capsules (40 mg total) by mouth 2 (two) times daily before a meal. 244 capsule 0  . promethazine (PHENERGAN) 25 MG tablet Take 1 tablet (25 mg total) by mouth every 8 (eight) hours as needed for nausea or vomiting. 20 tablet 0   No current facility-administered medications for this visit.     Allergies as of 09/08/2018 - Review Complete 08/14/2018  Allergen Reaction Noted  . Bee pollen Anaphylaxis 01/04/2014  . Codeine Itching and Nausea Only 11/27/2007  . Prednisone Other (See Comments) 03/11/2008    ROS:  General: Negative for anorexia, weight loss, fever, chills, fatigue, weakness. ENT: Negative for hoarseness, difficulty swallowing , nasal congestion. CV: Negative for chest pain, angina, palpitations, dyspnea on exertion, peripheral edema.  Respiratory: Negative for dyspnea at rest, dyspnea on exertion, cough, sputum, wheezing.  GI: See history  of present illness. GU:  Negative for dysuria, hematuria, urinary incontinence, urinary frequency, nocturnal urination.  Endo: Negative for unusual weight change.    Physical Examination:   There were no vitals taken for this visit.  General: Well-nourished, well-developed in no acute distress.  Eyes: No icterus. Conjunctivae pink. Mouth: Oropharyngeal mucosa moist and pink , no lesions erythema or exudate. Lungs: Clear to auscultation bilaterally. Non-labored. Heart: Regular rate and rhythm, no murmurs rubs or gallops.    Abdomen: Bowel sounds are normal, nontender, nondistended, no hepatosplenomegaly or masses, no abdominal bruits or hernia , no rebound or guarding.   Extremities: No lower extremity edema. No clubbing or deformities. Neuro: Alert and oriented x 3.  Grossly intact. Skin: Warm and dry, no jaundice.   Psych: Alert and cooperative, normal mood and affect.   Imaging Studies: No results found.  Assessment and Plan:   Jeremy Delgado is a 47 y.o. y/o male here to follow up for abdominal pain and rectal bleeding . Likely constipation and smoking weed leading to nausea and abdominal pain. Strongly advised him to stop smoking and consuming marijuana as it may be causing his symptoms  Plan 1. Script for linzess 290 mcg given  2. Stop smoking nicotine and weed  3. Trial of IB guard   Dr Jonathon Bellows  MD,MRCP [facility]) Follow up PRN

## 2018-09-16 ENCOUNTER — Encounter: Payer: Self-pay | Admitting: Adult Health

## 2018-09-16 ENCOUNTER — Ambulatory Visit (INDEPENDENT_AMBULATORY_CARE_PROVIDER_SITE_OTHER): Payer: PPO | Admitting: Adult Health

## 2018-09-16 VITALS — BP 108/60 | HR 76 | Resp 16 | Ht 72.0 in | Wt 177.0 lb

## 2018-09-16 DIAGNOSIS — R109 Unspecified abdominal pain: Secondary | ICD-10-CM

## 2018-09-16 DIAGNOSIS — K219 Gastro-esophageal reflux disease without esophagitis: Secondary | ICD-10-CM | POA: Diagnosis not present

## 2018-09-16 DIAGNOSIS — E782 Mixed hyperlipidemia: Secondary | ICD-10-CM

## 2018-09-16 DIAGNOSIS — I10 Essential (primary) hypertension: Secondary | ICD-10-CM | POA: Diagnosis not present

## 2018-09-16 NOTE — Progress Notes (Signed)
[facility] [place], [place] [address]  Internal MEDICINE  Office Visit Note  Patient Name: Jeremy Delgado  [id number]  [id number]  Date of Service: 09/16/2018  Chief Complaint  Patient presents with  . Gastroesophageal Reflux  . Hypertension  . Hyperlipidemia  . Abdominal Pain    much better with linzess use     HPI  Pt is here for follow on GERD, HTN, HLD and abdominal pain.  Patient denies any issues with his GERD, hypertension, or hyperlipidemia.  He reports that his abdominal pain has improved drastically since he started taking the Linzess daily.  He has seen the GI doctor and had a work-up and it feels like his constipation and abdominal pain likely due to his persistent use of methadone.  His constipation is related to chronic narcotic use and Linzess is the perfect choice for this treatment.  Patient is having great results he would very much like to continue to use the Linzess.   Current Medication: Outpatient Encounter Medications as of 09/16/2018  Medication Sig Note  . aspirin EC 81 MG tablet Take by mouth.   . cloNIDine (CATAPRES) 0.1 MG tablet Take 1 tablet (0.1 mg total) by mouth 2 (two) times daily.   . cyclobenzaprine (FLEXERIL) 10 MG tablet TAKE 1 TABLET (10 MG TOTAL) BY MOUTH 2 (TWO) TIMES DAILY AS NEEDED (PAIN)   . FLUoxetine (PROZAC) 40 MG capsule Take 1 capsule (40 mg total) by mouth 2 (two) times daily.   Marland Kitchen gabapentin (NEURONTIN) 600 MG tablet TAKE 1 TABLET BY MOUTH TWICE A DAY   . levothyroxine (SYNTHROID, LEVOTHROID) 50 MCG tablet Take 1 tablet (50 mcg total) by mouth every morning.   . linaclotide (LINZESS) 290 MCG CAPS capsule Take 1 capsule (290 mcg total) by mouth daily before breakfast.   . Melatonin 3 MG CAPS Take 1 capsule by mouth at bedtime as needed (sleep).  09/27/2015: .   . omeprazole (PRILOSEC) 20 MG capsule Take 2 capsules (40 mg total) by mouth 2 (two) times daily before a meal.   . promethazine (PHENERGAN) 25 MG  tablet Take 1 tablet (25 mg total) by mouth every 8 (eight) hours as needed for nausea or vomiting. (Patient not taking: Reported on 09/08/2018) 08/14/2018: Out of medication   wants refill   No facility-administered encounter medications on file as of 09/16/2018.     Surgical History: Past Surgical History:  Procedure Laterality Date  . COLONOSCOPY WITH PROPOFOL N/A 08/14/2018   Procedure: COLONOSCOPY WITH PROPOFOL;  Surgeon: Jonathon Bellows, MD;  Location: [facility] ENDOSCOPY;  Service: Gastroenterology;  Laterality: N/A;  . ESOPHAGOGASTRODUODENOSCOPY (EGD) WITH PROPOFOL N/A 08/14/2018   Procedure: ESOPHAGOGASTRODUODENOSCOPY (EGD) WITH PROPOFOL;  Surgeon: Jonathon Bellows, MD;  Location: [facility] ENDOSCOPY;  Service: Gastroenterology;  Laterality: N/A;  . HEMORRHOID SURGERY      Medical History: Past Medical History:  Diagnosis Date  . Addiction to drug ([place]) 04/06/2015  . ANXIETY 03/11/2008   Qualifier: Diagnosis of  By: Hassell Done FNP, Tori Milks     . Arthritis   . BACK PAIN 02/17/2007   Qualifier: Diagnosis of  By: Silvio Pate MD, [facility] Cancer   . Chronic pain   . Colitis    self  . DEPRESSION 11/05/2003   Qualifier: Diagnosis of  By: Hassell Done FNP, Tori Milks    . GERD 12/29/2008   Qualifier: Diagnosis of  By: Hassell Done FNP, Tori Milks    . H/O arthritis 02/20/2015  . H/O: hypothyroidism 02/20/2015  . HYPERCHOLESTEROLEMIA 03/11/2008  Qualifier: Diagnosis of  By: Hassell Done FNP, Tori Milks    . Hypertension   . PAIN IN JOINT PELVIC REGION AND THIGH 03/11/2008   Qualifier: Diagnosis of  By: Hassell Done FNP, Tori Milks    . Renal disorder   . Sciatica   . TOBACCO ABUSE 07/15/2008   Qualifier: Diagnosis of  By: Hassell Done FNP, Tori Milks      Family History: Family History  Problem Relation Age of Onset  . Depression Mother   . Hypertension Father   . Heart failure Father   . Diabetes Paternal Grandfather     Social History   Socioeconomic History  . Marital status: Single    Spouse name: Not on file  . Number of children: Not  on file  . Years of education: Not on file  . Highest education level: Not on file  Occupational History  . Not on file  Social Needs  . Financial resource strain: Not on file  . Food insecurity:    Worry: Not on file    Inability: Not on file  . Transportation needs:    Medical: Not on file    Non-medical: Not on file  Tobacco Use  . Smoking status: Current Every Day Smoker    Packs/day: 0.50    Types: Cigarettes    Start date: 07/05/1990  . Smokeless tobacco: Never Used  . Tobacco comment: pt currently uses a E-cig  Substance and Sexual Activity  . Alcohol use: Yes    Alcohol/week: 0.0 standard drinks    Comment: social   . Drug use: Yes    Frequency: 2.0 times per week    Types: Marijuana  . Sexual activity: Yes    Birth control/protection: Condom  Lifestyle  . Physical activity:    Days per week: Not on file    Minutes per session: Not on file  . Stress: Not on file  Relationships  . Social connections:    Talks on phone: Not on file    Gets together: Not on file    Attends religious service: Not on file    Active member of club or organization: Not on file    Attends meetings of clubs or organizations: Not on file    Relationship status: Not on file  . Intimate partner violence:    Fear of current or ex partner: Not on file    Emotionally abused: Not on file    Physically abused: Not on file    Forced sexual activity: Not on file  Other Topics Concern  . Not on file  Social History Narrative  . Not on file      Review of Systems  Constitutional: Negative.  Negative for chills, fatigue and unexpected weight change.  HENT: Negative.  Negative for congestion, rhinorrhea, sneezing and sore throat.   Eyes: Negative for redness.  Respiratory: Negative.  Negative for cough, chest tightness and shortness of breath.   Cardiovascular: Negative.  Negative for chest pain and palpitations.  Gastrointestinal: Negative.  Negative for abdominal pain, constipation,  diarrhea, nausea and vomiting.  Endocrine: Negative.   Genitourinary: Negative.  Negative for dysuria and frequency.  Musculoskeletal: Negative.  Negative for arthralgias, back pain, joint swelling and neck pain.  Skin: Negative.  Negative for rash.  Allergic/Immunologic: Negative.   Neurological: Negative.  Negative for tremors and numbness.  Hematological: Negative for adenopathy. Does not bruise/bleed easily.  Psychiatric/Behavioral: Negative.  Negative for behavioral problems, sleep disturbance and suicidal ideas. The patient is not nervous/anxious.  Vital Signs: BP 108/60 (BP Location: Left Arm, Patient Position: Sitting, Cuff Size: Normal)   Pulse 76   Resp 16   Ht 6' (1.829 m)   Wt 177 lb (80.3 kg)   SpO2 97%   BMI 24.01 kg/m    Physical Exam  Constitutional: He is oriented to person, place, and time. He appears well-developed and well-nourished. No distress.  HENT:  Head: Normocephalic and atraumatic.  Mouth/Throat: Oropharynx is clear and moist. No oropharyngeal exudate.  Eyes: Pupils are equal, round, and reactive to light. EOM are normal.  Neck: Normal range of motion. Neck supple. No JVD present. No tracheal deviation present. No thyromegaly present.  Cardiovascular: Normal rate, regular rhythm and normal heart sounds. Exam reveals no gallop and no friction rub.  No murmur heard. Pulmonary/Chest: Effort normal and breath sounds normal. No respiratory distress. He has no wheezes. He has no rales. He exhibits no tenderness.  Abdominal: Soft. There is no tenderness. There is no guarding.  Musculoskeletal: Normal range of motion.  Lymphadenopathy:    He has no cervical adenopathy.  Neurological: He is alert and oriented to person, place, and time. No cranial nerve deficit.  Skin: Skin is warm and dry. He is not diaphoretic.  Psychiatric: He has a normal mood and affect. His behavior is normal. Judgment and thought content normal.  Nursing note and vitals  reviewed.   Assessment/Plan: 1. Abdominal pain, unspecified abdominal location Abdominal pain is improved. Narcotic induced constipation is likely culprit.  This resolved with Linzess.  2. Essential hypertension Pressure stable.  Continue current management.  3. Mixed hyperlipidemia Stable, continue current management.  4. Gastroesophageal reflux disease without esophagitis Reflux is well controlled, continue current therapy.  General Counseling: monty spicher understanding of the findings of todays visit and agrees with plan of treatment. I have discussed any further diagnostic evaluation that may be needed or ordered today. We also reviewed his medications today. he has been encouraged to call the office with any questions or concerns that should arise related to todays visit.    No orders of the defined types were placed in this encounter.   No orders of the defined types were placed in this encounter.   Time spent: 25 Minutes   This patient was seen by Orson Gear AGNP-C in Collaboration with Dr Lavera Guise as a part of collaborative care agreement     Kendell Bane AGNP-C Internal medicine

## 2018-09-16 NOTE — Patient Instructions (Signed)

## 2018-10-03 ENCOUNTER — Other Ambulatory Visit: Payer: Self-pay | Admitting: Gastroenterology

## 2018-10-03 DIAGNOSIS — K219 Gastro-esophageal reflux disease without esophagitis: Secondary | ICD-10-CM

## 2018-10-05 ENCOUNTER — Ambulatory Visit: Payer: Self-pay | Admitting: Adult Health

## 2018-10-10 ENCOUNTER — Other Ambulatory Visit: Payer: Self-pay | Admitting: Adult Health

## 2018-10-10 DIAGNOSIS — E039 Hypothyroidism, unspecified: Secondary | ICD-10-CM

## 2018-10-20 ENCOUNTER — Encounter: Payer: Self-pay | Admitting: Adult Health

## 2018-10-20 ENCOUNTER — Ambulatory Visit (INDEPENDENT_AMBULATORY_CARE_PROVIDER_SITE_OTHER): Payer: PPO | Admitting: Adult Health

## 2018-10-20 ENCOUNTER — Telehealth: Payer: Self-pay | Admitting: Adult Health

## 2018-10-20 VITALS — BP 128/80 | HR 73 | Temp 99.2°F | Resp 16 | Ht 72.0 in | Wt 173.0 lb

## 2018-10-20 DIAGNOSIS — J029 Acute pharyngitis, unspecified: Secondary | ICD-10-CM | POA: Diagnosis not present

## 2018-10-20 DIAGNOSIS — B370 Candidal stomatitis: Secondary | ICD-10-CM | POA: Diagnosis not present

## 2018-10-20 MED ORDER — PENICILLIN V POTASSIUM 500 MG PO TABS: 500.0000 mg | ORAL_TABLET | Freq: Two times a day (BID) | ORAL | 0 refills | Status: DC

## 2018-10-20 MED ORDER — FIRST-DUKES MOUTHWASH MT SUSP: OROMUCOSAL | 1 refills | Status: DC

## 2018-10-20 NOTE — Telephone Encounter (Signed)
Called in dukes magic mouthwash with lidocaine

## 2018-10-20 NOTE — Patient Instructions (Signed)

## 2018-10-20 NOTE — Progress Notes (Signed)
[facility] [address], [place] [address]  Internal MEDICINE  Office Visit Note  Patient Name: Jeremy Delgado  [id number]  [id number]  Date of Service: 10/22/2018  Chief Complaint  Patient presents with  . Sore Throat    bumps on tongue and tongue hurts   . Fever     HPI Pt is here for a sick visit. Pt reports three weeks of intermittent sore throat.  He was not able to check his temperature however he did have episodes of chills.  He states his been feeling weak and tired and thinks he needs antibiotics to help alleviate the symptoms.  He is also complaining of a rash on his tongue and that he has pain on his tongue.     Current Medication:  Outpatient Encounter Medications as of 10/20/2018  Medication Sig Note  . aspirin EC 81 MG tablet Take by mouth.   . cloNIDine (CATAPRES) 0.1 MG tablet Take 1 tablet (0.1 mg total) by mouth 2 (two) times daily.   . cyclobenzaprine (FLEXERIL) 10 MG tablet TAKE 1 TABLET (10 MG TOTAL) BY MOUTH 2 (TWO) TIMES DAILY AS NEEDED (PAIN)   . FLUoxetine (PROZAC) 40 MG capsule Take 1 capsule (40 mg total) by mouth 2 (two) times daily.   Marland Kitchen gabapentin (NEURONTIN) 600 MG tablet TAKE 1 TABLET BY MOUTH TWICE A DAY   . levothyroxine (SYNTHROID, LEVOTHROID) 50 MCG tablet TAKE 1 TABLET BY MOUTH EVERY DAY IN THE MORNING   . linaclotide (LINZESS) 290 MCG CAPS capsule Take 1 capsule (290 mcg total) by mouth daily before breakfast.   . Melatonin 3 MG CAPS Take 1 capsule by mouth at bedtime as needed (sleep).  09/27/2015: .   . omeprazole (PRILOSEC) 20 MG capsule Take 2 capsules (40 mg total) by mouth 2 (two) times daily before a meal.   . penicillin v potassium (VEETID) 500 MG tablet Take 1 tablet (500 mg total) by mouth 2 (two) times daily.   . [DISCONTINUED] promethazine (PHENERGAN) 25 MG tablet Take 1 tablet (25 mg total) by mouth every 8 (eight) hours as needed for nausea or vomiting. (Patient not taking: Reported on 09/08/2018)  08/14/2018: Out of medication   wants refill   No facility-administered encounter medications on file as of 10/20/2018.       Medical History: Past Medical History:  Diagnosis Date  . Addiction to drug ([place]) 04/06/2015  . ANXIETY 03/11/2008   Qualifier: Diagnosis of  By: Hassell Done FNP, Tori Milks     . Arthritis   . BACK PAIN 02/17/2007   Qualifier: Diagnosis of  By: Silvio Pate MD, [facility] Cancer   . Chronic pain   . Colitis    self  . DEPRESSION 11/05/2003   Qualifier: Diagnosis of  By: Hassell Done FNP, Tori Milks    . GERD 12/29/2008   Qualifier: Diagnosis of  By: Hassell Done FNP, Tori Milks    . H/O arthritis 02/20/2015  . H/O: hypothyroidism 02/20/2015  . HYPERCHOLESTEROLEMIA 03/11/2008   Qualifier: Diagnosis of  By: Hassell Done FNP, Tori Milks    . Hypertension   . PAIN IN JOINT PELVIC REGION AND THIGH 03/11/2008   Qualifier: Diagnosis of  By: Hassell Done FNP, Tori Milks    . Renal disorder   . Sciatica   . TOBACCO ABUSE 07/15/2008   Qualifier: Diagnosis of  By: Hassell Done FNP, [place]       Vital Signs: BP 128/80 (BP Location: Left Arm, Patient Position: Sitting, Cuff Size: Normal)   Pulse 73   Temp 99.2  F (37.3 C) (Oral)   Resp 16   Ht 6' (1.829 m)   Wt 173 lb (78.5 kg)   SpO2 98%   BMI 23.46 kg/m    Review of Systems  Constitutional: Negative.  Negative for chills, fatigue and unexpected weight change.  HENT: Positive for mouth sores and sore throat. Negative for congestion, rhinorrhea and sneezing.   Eyes: Negative for redness.  Respiratory: Negative.  Negative for cough, chest tightness and shortness of breath.   Cardiovascular: Negative.  Negative for chest pain and palpitations.  Gastrointestinal: Negative.  Negative for abdominal pain, constipation, diarrhea, nausea and vomiting.  Endocrine: Negative.   Genitourinary: Negative.  Negative for dysuria and frequency.  Musculoskeletal: Negative.  Negative for arthralgias, back pain, joint swelling and neck pain.  Skin: Negative.  Negative for rash.   Allergic/Immunologic: Negative.   Neurological: Negative.  Negative for tremors and numbness.  Hematological: Negative for adenopathy. Does not bruise/bleed easily.  Psychiatric/Behavioral: Negative.  Negative for behavioral problems, sleep disturbance and suicidal ideas. The patient is not nervous/anxious.     Physical Exam Vitals signs and nursing note reviewed.  Constitutional:      General: He is not in acute distress.    Appearance: He is well-developed. He is not diaphoretic.  HENT:     Head: Normocephalic and atraumatic.     Mouth/Throat:     Mouth: Mucous membranes are moist.     Pharynx: Uvula midline. Pharyngeal swelling, oropharyngeal exudate and posterior oropharyngeal erythema present.  Eyes:     Pupils: Pupils are equal, round, and reactive to light.  Neck:     Musculoskeletal: Normal range of motion and neck supple.     Thyroid: No thyromegaly.     Vascular: No JVD.     Trachea: No tracheal deviation.  Cardiovascular:     Rate and Rhythm: Normal rate and regular rhythm.     Heart sounds: Normal heart sounds. No murmur. No friction rub. No gallop.   Pulmonary:     Effort: Pulmonary effort is normal. No respiratory distress.     Breath sounds: Normal breath sounds. No wheezing or rales.  Chest:     Chest wall: No tenderness.  Abdominal:     Palpations: Abdomen is soft.     Tenderness: There is no abdominal tenderness. There is no guarding.  Musculoskeletal: Normal range of motion.  Lymphadenopathy:     Cervical: No cervical adenopathy.  Skin:    General: Skin is warm and dry.  Neurological:     Mental Status: He is alert and oriented to person, place, and time.     Cranial Nerves: No cranial nerve deficit.  Psychiatric:        Behavior: Behavior normal.        Thought Content: Thought content normal.        Judgment: Judgment normal.    Assessment/Plan: 1. Pharyngitis, unspecified etiology RX for [facility] VK.  500mg  TID x 10days.   2. Sore throat Rapid  strep negative. - POCT rapid strep A  3. Oral candida Sent RX for Dukes magic mouthwash.    General Counseling: Jeremy Delgado understanding of the findings of todays visit and agrees with plan of treatment. I have discussed any further diagnostic evaluation that may be needed or ordered today. We also reviewed his medications today. he has been encouraged to call the office with any questions or concerns that should arise related to todays visit.   Orders Placed This Encounter  Procedures  .  POCT rapid strep A    Meds ordered this encounter  Medications  . penicillin v potassium (VEETID) 500 MG tablet    Sig: Take 1 tablet (500 mg total) by mouth 2 (two) times daily.    Dispense:  20 tablet    Refill:  0    Time spent: 25 Minutes  This patient was seen by Orson Gear AGNP-C in Collaboration with Dr Lavera Guise as a part of collaborative care agreement.  Kendell Bane AGNP-C Internal Medicine

## 2018-10-22 ENCOUNTER — Encounter: Payer: Self-pay | Admitting: Adult Health

## 2018-10-22 LAB — POCT RAPID STREP A (OFFICE): Rapid Strep A Screen: NEGATIVE

## 2018-11-03 ENCOUNTER — Ambulatory Visit (INDEPENDENT_AMBULATORY_CARE_PROVIDER_SITE_OTHER): Payer: PPO | Admitting: Nurse Practitioner

## 2018-11-03 ENCOUNTER — Encounter: Payer: Self-pay | Admitting: Nurse Practitioner

## 2018-11-03 VITALS — BP 150/88 | HR 64 | Resp 16 | Ht 72.0 in | Wt 167.0 lb

## 2018-11-03 DIAGNOSIS — M25561 Pain in right knee: Secondary | ICD-10-CM | POA: Diagnosis not present

## 2018-11-03 DIAGNOSIS — M25461 Effusion, right knee: Secondary | ICD-10-CM

## 2018-11-03 NOTE — Progress Notes (Signed)
[facility] [place], [place] [address]  Internal MEDICINE  Office Visit Note  Patient Name: Jeremy Delgado  [id number]  [id number]  Date of Service: 11/03/2018   Pt is here for a sick visit.  Chief Complaint  Patient presents with  . Edema    right knee swollen, possibly fluid, not too much pain, pt fell out of bed a coupld of months ago in his sleep and not sure if this is related to the knee, noticed the swelling about 3 days ago, it seems to be swelling more now no signs of improvement     The patient is c/o right knee swelling. States that he fell out of bed about 1 month ago. Fell directly on his knees. Really had no problems or pain after his fall. Noted swelling on his knee cap about 1 week ago. Swelling is soft but about the size of golfball. Is slightly tender to palpate. ROM and strength in right knee is intact.        Current Medication:  Outpatient Encounter Medications as of 11/03/2018  Medication Sig Note  . aspirin EC 81 MG tablet Take by mouth.   . cloNIDine (CATAPRES) 0.1 MG tablet Take 1 tablet (0.1 mg total) by mouth 2 (two) times daily.   . cyclobenzaprine (FLEXERIL) 10 MG tablet TAKE 1 TABLET (10 MG TOTAL) BY MOUTH 2 (TWO) TIMES DAILY AS NEEDED (PAIN)   . Diphenhyd-Hydrocort-Nystatin (FIRST-DUKES MOUTHWASH) SUSP Swish and spit 53mls three times a day   . FLUoxetine (PROZAC) 40 MG capsule Take 1 capsule (40 mg total) by mouth 2 (two) times daily.   Marland Kitchen gabapentin (NEURONTIN) 600 MG tablet TAKE 1 TABLET BY MOUTH TWICE A DAY   . levothyroxine (SYNTHROID, LEVOTHROID) 50 MCG tablet TAKE 1 TABLET BY MOUTH EVERY DAY IN THE MORNING   . linaclotide (LINZESS) 290 MCG CAPS capsule Take 1 capsule (290 mcg total) by mouth daily before breakfast.   . Melatonin 3 MG CAPS Take 1 capsule by mouth at bedtime as needed (sleep).  09/27/2015: .   . penicillin v potassium (VEETID) 500 MG tablet Take 1 tablet (500 mg total) by mouth 2 (two) times daily.    Marland Kitchen omeprazole (PRILOSEC) 20 MG capsule Take 2 capsules (40 mg total) by mouth 2 (two) times daily before a meal.    No facility-administered encounter medications on file as of 11/03/2018.       Medical History: Past Medical History:  Diagnosis Date  . Addiction to drug ([place]) 04/06/2015  . ANXIETY 03/11/2008   Qualifier: Diagnosis of  By: Hassell Done FNP, Tori Milks     . Arthritis   . BACK PAIN 02/17/2007   Qualifier: Diagnosis of  By: Silvio Pate MD, Baird Cancer   . Chronic pain   . Colitis    self  . DEPRESSION 11/05/2003   Qualifier: Diagnosis of  By: Hassell Done FNP, Tori Milks    . GERD 12/29/2008   Qualifier: Diagnosis of  By: Hassell Done FNP, Tori Milks    . H/O arthritis 02/20/2015  . H/O: hypothyroidism 02/20/2015  . HYPERCHOLESTEROLEMIA 03/11/2008   Qualifier: Diagnosis of  By: Hassell Done FNP, Tori Milks    . Hypertension   . PAIN IN JOINT PELVIC REGION AND THIGH 03/11/2008   Qualifier: Diagnosis of  By: Hassell Done FNP, Tori Milks    . Renal disorder   . Sciatica   . TOBACCO ABUSE 07/15/2008   Qualifier: Diagnosis of  By: Hassell Done FNP, Tori Milks       Today's Vitals  11/03/18 1010  BP: (!) 150/88  Pulse: 64  Resp: 16  SpO2: 95%  Weight: 167 lb (75.8 kg)  Height: 6' (1.829 m)    Review of Systems  Constitutional: Negative for activity change, chills, fatigue and unexpected weight change.  HENT: Negative for congestion, postnasal drip, rhinorrhea, sneezing and sore throat.   Eyes: Negative for redness.  Respiratory: Negative for cough, chest tightness and shortness of breath.   Cardiovascular: Negative for chest pain and palpitations.  Gastrointestinal: Negative for abdominal pain, constipation, diarrhea, nausea and vomiting.  Endocrine: Negative for cold intolerance, heat intolerance, polydipsia and polyuria.  Genitourinary: Negative for dysuria and frequency.  Musculoskeletal: Positive for arthralgias and joint swelling. Negative for back pain and neck pain.       Right knee swelling.   Skin: Negative  for rash.  Allergic/Immunologic: Negative for environmental allergies.  Neurological: Negative.  Negative for tremors and numbness.  Hematological: Negative for adenopathy. Does not bruise/bleed easily.  Psychiatric/Behavioral: Negative for behavioral problems (Depression), sleep disturbance and suicidal ideas. The patient is not nervous/anxious.     Physical Exam Vitals signs and nursing note reviewed.  Constitutional:      General: He is not in acute distress.    Appearance: Normal appearance. He is well-developed. He is not diaphoretic.  HENT:     Head: Normocephalic and atraumatic.     Mouth/Throat:     Pharynx: No oropharyngeal exudate.  Neck:     Musculoskeletal: Normal range of motion and neck supple.     Thyroid: No thyromegaly.     Vascular: No JVD.     Trachea: No tracheal deviation.  Cardiovascular:     Rate and Rhythm: Normal rate and regular rhythm.     Heart sounds: Normal heart sounds. No murmur. No friction rub. No gallop.   Pulmonary:     Effort: Pulmonary effort is normal. No respiratory distress.     Breath sounds: Normal breath sounds. No wheezing or rales.  Chest:     Chest wall: No tenderness.  Abdominal:     General: Bowel sounds are normal.     Palpations: Abdomen is soft.  Musculoskeletal: Normal range of motion.       Legs:  Lymphadenopathy:     Cervical: No cervical adenopathy.  Skin:    General: Skin is warm and dry.  Neurological:     General: No focal deficit present.     Mental Status: He is alert and oriented to person, place, and time.     Cranial Nerves: No cranial nerve deficit.  Psychiatric:        Behavior: Behavior normal.        Thought Content: Thought content normal.        Judgment: Judgment normal.   Assessment/Plan: 1. Pain and swelling of right knee Refer to orthopedics for further evaluation and treatment.  - Ambulatory referral to Orthopedic Surgery  General Counseling: dashon mcintire understanding of the findings  of todays visit and agrees with plan of treatment. I have discussed any further diagnostic evaluation that may be needed or ordered today. We also reviewed his medications today. he has been encouraged to call the office with any questions or concerns that should arise related to todays visit.    Counseling:  Apply a compressive ACE bandage. Rest and elevate the affected painful area.  Apply cold compresses intermittently as needed.  As pain recedes, begin normal activities slowly as tolerated.  Call if symptoms persist.  This patient was  seen by Leretha Pol FNP Collaboration with Dr Lavera Guise as a part of collaborative care agreement  Orders Placed This Encounter  Procedures  . Ambulatory referral to Orthopedic Surgery      Time spent: 25 Minutes

## 2018-11-10 ENCOUNTER — Other Ambulatory Visit: Payer: Self-pay

## 2018-11-10 DIAGNOSIS — G8929 Other chronic pain: Secondary | ICD-10-CM

## 2018-11-10 MED ORDER — GABAPENTIN 600 MG PO TABS: 600.0000 mg | ORAL_TABLET | Freq: Two times a day (BID) | ORAL | 3 refills | Status: DC

## 2018-11-19 ENCOUNTER — Other Ambulatory Visit: Payer: Self-pay

## 2018-11-19 MED ORDER — OMEPRAZOLE 40 MG PO CPDR: 40.0000 mg | DELAYED_RELEASE_CAPSULE | Freq: Every day | ORAL | 1 refills | Status: DC

## 2018-12-16 ENCOUNTER — Encounter: Payer: Self-pay | Admitting: Adult Health

## 2018-12-16 ENCOUNTER — Ambulatory Visit (INDEPENDENT_AMBULATORY_CARE_PROVIDER_SITE_OTHER): Payer: PPO | Admitting: Adult Health

## 2018-12-16 VITALS — BP 146/92 | HR 71 | Resp 16 | Ht 72.0 in | Wt 178.0 lb

## 2018-12-16 DIAGNOSIS — K219 Gastro-esophageal reflux disease without esophagitis: Secondary | ICD-10-CM | POA: Diagnosis not present

## 2018-12-16 DIAGNOSIS — F1120 Opioid dependence, uncomplicated: Secondary | ICD-10-CM | POA: Diagnosis not present

## 2018-12-16 DIAGNOSIS — I10 Essential (primary) hypertension: Secondary | ICD-10-CM

## 2018-12-16 DIAGNOSIS — R300 Dysuria: Secondary | ICD-10-CM | POA: Diagnosis not present

## 2018-12-16 DIAGNOSIS — F17219 Nicotine dependence, cigarettes, with unspecified nicotine-induced disorders: Secondary | ICD-10-CM

## 2018-12-16 DIAGNOSIS — Z0001 Encounter for general adult medical examination with abnormal findings: Secondary | ICD-10-CM

## 2018-12-16 DIAGNOSIS — E039 Hypothyroidism, unspecified: Secondary | ICD-10-CM | POA: Diagnosis not present

## 2018-12-16 NOTE — Patient Instructions (Signed)

## 2018-12-16 NOTE — Progress Notes (Signed)
[facility] [place], [place] [address]  Internal MEDICINE  Office Visit Note  Patient Name: Jeremy Delgado  [id number]  [id number]  Date of Service: 12/16/2018  Chief Complaint  Patient presents with  . Annual Exam    medicare annual exam  . Hypothyroidism  . Hypertension  . Hyperlipidemia  . Depression  . Back Pain     HPI Pt is here for routine health maintenance examination.  He has a history of Hypothyroidism, HTN, HLD, depression and back pain.  He reports he was involved with a methadone clinic for over 6 months. He reports he could not afford to continue to pay $14 a day. He has been having significant pain in her lower back as well as neuropathic pain in his feet. Pt reports he has had a rough couple weeks detoxing from the methadone.  His denies any issues with HTN, depression or HLD.  He denies chest pain, SOB, or other symptoms.      Current Medication: Outpatient Encounter Medications as of 12/16/2018  Medication Sig Note  . aspirin EC 81 MG tablet Take by mouth.   . cloNIDine (CATAPRES) 0.1 MG tablet Take 1 tablet (0.1 mg total) by mouth 2 (two) times daily.   . cyclobenzaprine (FLEXERIL) 10 MG tablet TAKE 1 TABLET (10 MG TOTAL) BY MOUTH 2 (TWO) TIMES DAILY AS NEEDED (PAIN)   . FLUoxetine (PROZAC) 40 MG capsule Take 1 capsule (40 mg total) by mouth 2 (two) times daily.   Marland Kitchen gabapentin (NEURONTIN) 600 MG tablet Take 1 tablet (600 mg total) by mouth 2 (two) times daily.   Marland Kitchen levothyroxine (SYNTHROID, LEVOTHROID) 50 MCG tablet TAKE 1 TABLET BY MOUTH EVERY DAY IN THE MORNING   . linaclotide (LINZESS) 290 MCG CAPS capsule Take 1 capsule (290 mcg total) by mouth daily before breakfast.   . Melatonin 3 MG CAPS Take 1 capsule by mouth at bedtime as needed (sleep).  09/27/2015: .   . omeprazole (PRILOSEC) 40 MG capsule Take 1 capsule (40 mg total) by mouth daily.   . Diphenhyd-Hydrocort-Nystatin (FIRST-DUKES MOUTHWASH) SUSP Swish and spit 37mls three  times a day (Patient not taking: Reported on 12/16/2018)   . [DISCONTINUED] omeprazole (PRILOSEC) 20 MG capsule Take 2 capsules (40 mg total) by mouth 2 (two) times daily before a meal.   . [DISCONTINUED] penicillin v potassium (VEETID) 500 MG tablet Take 1 tablet (500 mg total) by mouth 2 (two) times daily. (Patient not taking: Reported on 12/16/2018)    No facility-administered encounter medications on file as of 12/16/2018.     Surgical History: Past Surgical History:  Procedure Laterality Date  . COLONOSCOPY WITH PROPOFOL N/A 08/14/2018   Procedure: COLONOSCOPY WITH PROPOFOL;  Surgeon: Jonathon Bellows, MD;  Location: [facility] ENDOSCOPY;  Service: Gastroenterology;  Laterality: N/A;  . ESOPHAGOGASTRODUODENOSCOPY (EGD) WITH PROPOFOL N/A 08/14/2018   Procedure: ESOPHAGOGASTRODUODENOSCOPY (EGD) WITH PROPOFOL;  Surgeon: Jonathon Bellows, MD;  Location: [facility] ENDOSCOPY;  Service: Gastroenterology;  Laterality: N/A;  . HEMORRHOID SURGERY      Medical History: Past Medical History:  Diagnosis Date  . Addiction to drug ([place]) 04/06/2015  . ANXIETY 03/11/2008   Qualifier: Diagnosis of  By: Hassell Done FNP, Tori Milks     . Arthritis   . BACK PAIN 02/17/2007   Qualifier: Diagnosis of  By: Silvio Pate MD, [facility] Cancer   . Chronic pain   . Colitis    self  . DEPRESSION 11/05/2003   Qualifier: Diagnosis of  By: Hassell Done FNP, Tori Milks    .  GERD 12/29/2008   Qualifier: Diagnosis of  By: Hassell Done FNP, Tori Milks    . H/O arthritis 02/20/2015  . H/O: hypothyroidism 02/20/2015  . HYPERCHOLESTEROLEMIA 03/11/2008   Qualifier: Diagnosis of  By: Hassell Done FNP, Tori Milks    . Hypertension   . PAIN IN JOINT PELVIC REGION AND THIGH 03/11/2008   Qualifier: Diagnosis of  By: Hassell Done FNP, Tori Milks    . Renal disorder   . Sciatica   . Thyroid disease   . TOBACCO ABUSE 07/15/2008   Qualifier: Diagnosis of  By: Hassell Done FNP, Tori Milks      Family History: Family History  Problem Relation Age of Onset  . Depression Mother   . Hypertension Father   .  Heart failure Father   . Diabetes Paternal Grandfather     Depression screen [facility] 2/9 12/16/2018 11/03/2018 09/16/2018 06/05/2018 12/22/2017  Decreased Interest 0 3 0 2 1  Down, Depressed, Hopeless 0 1 0 2 1  PHQ - 2 Score 0 4 0 4 2  Altered sleeping - 3 - 3 1  Tired, decreased energy - 3 - 3 1  Change in appetite - 3 - 1 1  Feeling bad or failure about yourself  - 2 - 2 0  Trouble concentrating - 0 - 0 0  Moving slowly or fidgety/restless - 0 - 3 0  Suicidal thoughts - 0 - 0 0  PHQ-9 Score - 15 - 16 5  Difficult doing work/chores - - - Somewhat difficult Not difficult at all    Functional Status Survey: Is the patient deaf or have difficulty hearing?: No Does the patient have difficulty seeing, even when wearing glasses/contacts?: No Does the patient have difficulty concentrating, remembering, or making decisions?: No Does the patient have difficulty walking or climbing stairs?: Yes Does the patient have difficulty dressing or bathing?: No Does the patient have difficulty doing errands alone such as visiting a doctor's office or shopping?: No  MMSE - [place] Exam 12/16/2018  Orientation to time 5  Orientation to Place 5  Registration 3  Attention/ Calculation 5  Recall 3  Language- name 2 objects 2  Language- repeat 1  Language- follow 3 step command 3  Language- read & follow direction 1  Write a sentence 1  Copy design 1  Total score 30    Fall Risk  12/16/2018 11/03/2018 09/16/2018 06/05/2018 12/22/2017  Falls in the past year? 0 1 0 No No  Number falls in past yr: - 0 - - -  Injury with Fall? - 0 - - -     Review of Systems  Constitutional: Negative.  Negative for chills, fatigue and unexpected weight change.  HENT: Negative.  Negative for congestion, rhinorrhea, sneezing and sore throat.   Eyes: Negative for redness.  Respiratory: Negative.  Negative for cough, chest tightness and shortness of breath.   Cardiovascular: Negative.  Negative for chest pain and  palpitations.  Gastrointestinal: Negative.  Negative for abdominal pain, constipation, diarrhea, nausea and vomiting.  Endocrine: Negative.   Genitourinary: Negative.  Negative for dysuria and frequency.  Musculoskeletal: Positive for back pain. Negative for arthralgias, joint swelling and neck pain.  Skin: Negative.  Negative for rash.  Allergic/Immunologic: Negative.   Neurological: Negative.  Negative for tremors and numbness.  Hematological: Negative for adenopathy. Does not bruise/bleed easily.  Psychiatric/Behavioral: Negative.  Negative for behavioral problems, sleep disturbance and suicidal ideas. The patient is not nervous/anxious.      Vital Signs: BP (!) 146/92   Pulse  71   Resp 16   Ht 6' (1.829 m)   Wt 178 lb (80.7 kg)   SpO2 98%   BMI 24.14 kg/m    Physical Exam Vitals signs and nursing note reviewed.  Constitutional:      General: He is not in acute distress.    Appearance: He is well-developed. He is not diaphoretic.  HENT:     Head: Normocephalic and atraumatic.     Mouth/Throat:     Pharynx: No oropharyngeal exudate.  Eyes:     Pupils: Pupils are equal, round, and reactive to light.  Neck:     Musculoskeletal: Normal range of motion and neck supple.     Thyroid: No thyromegaly.     Vascular: No JVD.     Trachea: No tracheal deviation.  Cardiovascular:     Rate and Rhythm: Normal rate and regular rhythm.     Heart sounds: Normal heart sounds. No murmur. No friction rub. No gallop.   Pulmonary:     Effort: Pulmonary effort is normal. No respiratory distress.     Breath sounds: Normal breath sounds. No wheezing or rales.  Chest:     Chest wall: No tenderness.  Abdominal:     Palpations: Abdomen is soft.     Tenderness: There is no abdominal tenderness. There is no guarding.  Musculoskeletal: Normal range of motion.  Lymphadenopathy:     Cervical: No cervical adenopathy.  Skin:    General: Skin is warm and dry.  Neurological:     Mental Status:  He is alert and oriented to person, place, and time.     Cranial Nerves: No cranial nerve deficit.  Psychiatric:        Behavior: Behavior normal.        Thought Content: Thought content normal.        Judgment: Judgment normal.     LABS: Recent Results (from the past 2160 hour(s))  POCT rapid strep A     Status: None   Collection Time: 10/22/18  1:58 PM  Result Value Ref Range   Rapid Strep A Screen Negative Negative    Assessment/Plan: 1. Encounter for general adult medical examination with abnormal findings Pt is up to date on preventative health maintenance. Will review lab results as available.  - CBC with Differential/Platelet - Lipid Panel With LDL/HDL Ratio - TSH - T4, free - Comprehensive metabolic panel - Urinalysis - PSA  2. Heroin dependence ([place]) Pt reports he has slipped, and used Heroin a few times since stopping the methadone clinic.  He is attempting to not use.  He feels like he needs to see another methadone clinic.   - Ambulatory referral to Pain Clinic  3. Cigarette nicotine dependence with nicotine-induced disorder Pt continues to smoke 1/2 PPD.  Smoking cessation counseling: 1. Pt acknowledges the risks of long term smoking, she will try to quite smoking. 2. Options for different medications including nicotine products, chewing gum, patch etc, Wellbutrin and Chantix is discussed 3. Goal and date of compete cessation is discussed 4. Total time spent in smoking cessation is 15 min.   4. Essential hypertension Slightly elevated today.  Will continue to monitor in the future.  Pt will continue taking medications as prescribed.   5. Dysuria - UA/M w/rflx Culture, Routine  6. Gastroesophageal reflux disease without esophagitis He continues to report issues. Encouraged him to continue Prilosec, and move to PRN linzess for constipation.   7. Hypothyroidism, unspecified type Will check TSH and Free  T4.   General Counseling: Jeremy Delgado  understanding of the findings of todays visit and agrees with plan of treatment. I have discussed any further diagnostic evaluation that may be needed or ordered today. We also reviewed his medications today. he has been encouraged to call the office with any questions or concerns that should arise related to todays visit.   Orders Placed This Encounter  Procedures  . UA/M w/rflx Culture, Routine    No orders of the defined types were placed in this encounter.   Time spent: 25 Minutes   This patient was seen by Orson Gear AGNP-C in Collaboration with Dr Lavera Guise as a part of collaborative care agreement    Kendell Bane AGNP-C Internal Medicine

## 2018-12-17 LAB — MICROSCOPIC EXAMINATION
Casts: NONE SEEN /lpf
RBC MICROSCOPIC, UA: NONE SEEN /HPF (ref 0–2)

## 2018-12-17 LAB — UA/M W/RFLX CULTURE, ROUTINE
Bilirubin, UA: NEGATIVE
Glucose, UA: NEGATIVE
Ketones, UA: NEGATIVE
Leukocytes, UA: NEGATIVE
Nitrite, UA: NEGATIVE
PROTEIN UA: NEGATIVE
RBC, UA: NEGATIVE
SPEC GRAV UA: 1.022 (ref 1.005–1.030)
Urobilinogen, Ur: 0.2 mg/dL (ref 0.2–1.0)
pH, UA: 5 (ref 5.0–7.5)

## 2018-12-18 ENCOUNTER — Other Ambulatory Visit: Payer: Self-pay | Admitting: Adult Health

## 2018-12-18 DIAGNOSIS — I10 Essential (primary) hypertension: Secondary | ICD-10-CM

## 2019-01-16 ENCOUNTER — Other Ambulatory Visit: Payer: Self-pay | Admitting: Nurse Practitioner

## 2019-01-16 DIAGNOSIS — G8929 Other chronic pain: Secondary | ICD-10-CM

## 2019-01-18 NOTE — Telephone Encounter (Signed)
Pt need appt for further refills.

## 2019-01-29 ENCOUNTER — Other Ambulatory Visit: Payer: Self-pay | Admitting: Adult Health

## 2019-01-29 MED ORDER — OMEPRAZOLE 40 MG PO CPDR: 40.0000 mg | DELAYED_RELEASE_CAPSULE | Freq: Every day | ORAL | 2 refills | Status: DC

## 2019-02-01 ENCOUNTER — Other Ambulatory Visit: Payer: Self-pay

## 2019-02-01 MED ORDER — OMEPRAZOLE 40 MG PO CPDR: 40.0000 mg | DELAYED_RELEASE_CAPSULE | Freq: Every day | ORAL | 2 refills | Status: DC

## 2019-02-03 ENCOUNTER — Other Ambulatory Visit: Payer: Self-pay

## 2019-02-03 ENCOUNTER — Encounter: Payer: Self-pay | Admitting: Nurse Practitioner

## 2019-02-03 ENCOUNTER — Ambulatory Visit (INDEPENDENT_AMBULATORY_CARE_PROVIDER_SITE_OTHER): Payer: PPO | Admitting: Internal Medicine

## 2019-02-03 DIAGNOSIS — G8929 Other chronic pain: Secondary | ICD-10-CM | POA: Diagnosis not present

## 2019-02-03 DIAGNOSIS — R200 Anesthesia of skin: Secondary | ICD-10-CM

## 2019-02-03 DIAGNOSIS — F331 Major depressive disorder, recurrent, moderate: Secondary | ICD-10-CM | POA: Diagnosis not present

## 2019-02-03 DIAGNOSIS — R202 Paresthesia of skin: Secondary | ICD-10-CM | POA: Diagnosis not present

## 2019-02-03 DIAGNOSIS — I739 Peripheral vascular disease, unspecified: Secondary | ICD-10-CM

## 2019-02-03 DIAGNOSIS — C61 Malignant neoplasm of prostate: Secondary | ICD-10-CM | POA: Diagnosis not present

## 2019-02-03 DIAGNOSIS — I10 Essential (primary) hypertension: Secondary | ICD-10-CM | POA: Diagnosis not present

## 2019-02-03 DIAGNOSIS — M545 Low back pain: Secondary | ICD-10-CM | POA: Diagnosis not present

## 2019-02-03 MED ORDER — AMLODIPINE BESYLATE 2.5 MG PO TABS: ORAL_TABLET | ORAL | 1 refills | Status: DC

## 2019-02-03 NOTE — Progress Notes (Signed)
[facility] [place],  [address]  Internal MEDICINE  Office Visit Note  Patient Name: Jeremy Delgado  [id number]  [id number]  Date of Service: 02/08/2019  Chief Complaint  Patient presents with  . Hyperlipidemia  . Hypertension  . Anxiety  . Medication Refill    HPI Pt is here with multiple complaints, C/O back pain, leg pain, difficulty walking, His MRI from last year was reviewed, He will like to see pain management. Denies any alcohol abuse. Gets very sleepy after taking gabapentin, Depression is under better control. He has pain at night as well, not sure if its leg pain is like a cramp. Denies any c/p, sob   Current Medication: Outpatient Encounter Medications as of 02/03/2019  Medication Sig Note  . aspirin EC 81 MG tablet Take by mouth.   . cyclobenzaprine (FLEXERIL) 10 MG tablet TAKE 1 TABLET (10 MG TOTAL) BY MOUTH 2 (TWO) TIMES DAILY AS NEEDED (PAIN)   . FLUoxetine (PROZAC) 40 MG capsule Take 1 capsule (40 mg total) by mouth 2 (two) times daily.   Marland Kitchen gabapentin (NEURONTIN) 600 MG tablet TAKE 1 TABLET BY MOUTH TWICE A DAY   . levothyroxine (SYNTHROID, LEVOTHROID) 50 MCG tablet TAKE 1 TABLET BY MOUTH EVERY DAY IN THE MORNING   . Melatonin 3 MG CAPS Take 1 capsule by mouth at bedtime as needed (sleep).  09/27/2015: .   . omeprazole (PRILOSEC) 40 MG capsule Take 1 capsule (40 mg total) by mouth daily.   . [DISCONTINUED] cloNIDine (CATAPRES) 0.1 MG tablet TAKE 1 TABLET (0.1 MG TOTAL) BY MOUTH 2 (TWO) TIMES DAILY.   Marland Kitchen amLODipine (NORVASC) 2.5 MG tablet Take one tab at night for BP   . Diphenhyd-Hydrocort-Nystatin (FIRST-DUKES MOUTHWASH) SUSP Swish and spit 76ms three times a day (Patient not taking: Reported on 12/16/2018)   . linaclotide (LINZESS) 290 MCG CAPS capsule Take 1 capsule (290 mcg total) by mouth daily before breakfast.    No facility-administered encounter medications on file as of 02/03/2019.     Surgical History: Past Surgical  History:  Procedure Laterality Date  . COLONOSCOPY WITH PROPOFOL N/A 08/14/2018   Procedure: COLONOSCOPY WITH PROPOFOL;  Surgeon: AJonathon Bellows MD;  [facility];  Service: Gastroenterology;  Laterality: N/A;  . ESOPHAGOGASTRODUODENOSCOPY (EGD) WITH PROPOFOL N/A 08/14/2018   Procedure: ESOPHAGOGASTRODUODENOSCOPY (EGD) WITH PROPOFOL;  Surgeon: AJonathon Bellows MD;  [facility];  Service: Gastroenterology;  Laterality: N/A;  . HEMORRHOID SURGERY      Medical History: Past Medical History:  Diagnosis Date  . [place] 04/[place]  . ANXIETY 03/11/2008   Qualifier: Diagnosis of  By: MHassell DoneFNP, NTori Milks    . Arthritis   . BACK PAIN 02/17/2007   Qualifier: Diagnosis of  By: LSilvio PateMD, RBaird Cancer  . Chronic pain   . Colitis    self  . DEPRESSION 11/05/2003   Qualifier: Diagnosis of  By: MHassell DoneFNP, NTori Milks   . GERD 12/29/2008   Qualifier: Diagnosis of  By: MHassell DoneFNP, NTori Milks   . H/O arthritis 02/20/2015  . H/O: hypothyroidism 02/20/2015  . HYPERCHOLESTEROLEMIA 03/11/2008   Qualifier: Diagnosis of  By: MHassell DoneFNP, NTori Milks   . Hypertension   . PAIN IN JOINT PELVIC REGION AND THIGH 03/11/2008   Qualifier: Diagnosis of  By: MHassell DoneFNP, NTori Milks   . Renal disorder   . Sciatica   . Thyroid disease   . TOBACCO ABUSE 07/15/2008   Qualifier: Diagnosis  of  By: Hassell Done FNP, Tori Milks      Family History: Family History  Problem Relation Age of Onset  . Depression Mother   . Hypertension Father   . Heart failure Father   . Diabetes Paternal Grandfather     Social History   Socioeconomic History  . Marital status: Single    Spouse name: Not on file  . Number of children: Not on file  . Years of education: Not on file  . Highest education level: Not on file  Occupational History  . Not on file  Social Needs  . Financial resource strain: Not on file  . Food insecurity:    Worry: Not on file    Inability: Not on file  . Transportation needs:     Medical: Not on file    Non-medical: Not on file  Tobacco Use  . Smoking status: Current Every Day Smoker    Packs/day: 0.50    Types: Cigarettes    Start date: 07/05/1990  . Smokeless tobacco: Never Used  . Tobacco comment: pt currently uses a E-cig  Substance and Sexual Activity  . Alcohol use: Yes    Alcohol/week: 0.0 standard drinks    Comment: social   . Drug use: Yes    Frequency: 2.0 times per week    Types: Marijuana  . Sexual activity: Yes    Birth control/protection: Condom  Lifestyle  . Physical activity:    Days per week: Not on file    Minutes per session: Not on file  . Stress: Not on file  Relationships  . Social connections:    Talks on phone: Not on file    Gets together: Not on file    Attends religious service: Not on file    Active member of club or organization: Not on file    Attends meetings of clubs or organizations: Not on file    Relationship status: Not on file  . Intimate partner violence:    Fear of current or ex partner: Not on file    Emotionally abused: Not on file    Physically abused: Not on file    Forced sexual activity: Not on file  Other Topics Concern  . Not on file  Social History Narrative  . Not on file    Review of Systems  Constitutional: Negative for chills, fatigue and unexpected weight change.  HENT: Positive for postnasal drip. Negative for congestion, rhinorrhea, sneezing and sore throat.   Eyes: Negative for redness.  Respiratory: Negative for cough, chest tightness and shortness of breath.   Cardiovascular: Negative for chest pain and palpitations.  Gastrointestinal: Negative for abdominal pain, constipation, diarrhea, nausea and vomiting.  Genitourinary: Negative for dysuria and frequency.  Musculoskeletal: Positive for back pain, gait problem and myalgias. Negative for arthralgias, joint swelling and neck pain.  Skin: Negative for rash.  Neurological: Negative for tremors and numbness.  Hematological: Negative for  adenopathy. Does not bruise/bleed easily.  Psychiatric/Behavioral: Positive for dysphoric mood. Negative for behavioral problems (Depression), sleep disturbance and suicidal ideas. The patient is not nervous/anxious.     Vital Signs: BP (!) 144/82   Pulse 89   Resp 16   Ht 6' (1.829 m)   Wt 175 lb (79.4 kg)   SpO2 100%   BMI 23.73 kg/m    Physical Exam Constitutional:      General: He is not in acute distress.    Appearance: He is well-developed. He is not diaphoretic.  HENT:  Head: Normocephalic and atraumatic.     Mouth/Throat:     Pharynx: No oropharyngeal exudate.  Eyes:     Pupils: Pupils are equal, round, and reactive to light.  Neck:     Musculoskeletal: Normal range of motion and neck supple.     Thyroid: No thyromegaly.     Vascular: No JVD.     Trachea: No tracheal deviation.  Cardiovascular:     Rate and Rhythm: Normal rate and regular rhythm.     Heart sounds: Normal heart sounds. No murmur. No friction rub. No gallop.      Comments: Decreased PP  Pulmonary:     Effort: Pulmonary effort is normal. No respiratory distress.     Breath sounds: No wheezing or rales.  Chest:     Chest wall: No tenderness.  Abdominal:     General: Bowel sounds are normal.     Palpations: Abdomen is soft.  Musculoskeletal: Normal range of motion.  Lymphadenopathy:     Cervical: No cervical adenopathy.  Skin:    General: Skin is warm and dry.  Neurological:     Mental Status: He is alert and oriented to person, place, and time.     Cranial Nerves: No cranial nerve deficit.  Psychiatric:        Behavior: Behavior normal.        Thought Content: Thought content normal.        Judgment: Judgment normal.    Assessment/Plan: 1. Claudication ([place]) - ABI abnormal, will need arterial studies  - B12 and Folate Panel - Sed Rate (ESR) - Ankle Brachial Index Assessment (BFP) - POCT ABI Screening Pilot No Charge - Ambulatory referral to Pain Clinic  2. Numbness and tingling  of both feet - Pt was instructed to decrease his gabapentin to 300 mg in am and 300 mg at lunch and 619m at bed time to avoid drowsiness  Sed Rate (ESR) - Ferritin; Future - POCT ABI Screening Pilot No Charge - Ambulatory referral to Pain Clinic  3. Essential hypertension, benign - DC Clonidine due to labile control, start amlodipine and titare prn   4. Moderate episode of recurrent major depressive disorder (HCC) - Continue with meds, satble for now   5. Chronic bilateral low back pain without sciatica - Uric acid - Sed Rate (ESR) - Rheumatoid Factor - Ambulatory referral to Pain Clinic  General Counseling: Mgerrett lomanunderstanding of the findings of todays visit and agrees with plan of treatment. I have discussed any further diagnostic evaluation that may be needed or ordered today. We also reviewed his medications today. he has been encouraged to call the office with any questions or concerns that should arise related to todays visit.    Orders Placed This Encounter  Procedures  . B12 and Folate Panel  . Uric acid  . Sed Rate (ESR)  . Rheumatoid Factor  . Ferritin  . Ankle Brachial Index Assessment (BFP)  . POCT ABI Screening Pilot No Charge    Meds ordered this encounter  Medications  . amLODipine (NORVASC) 2.5 MG tablet    Sig: Take one tab at night for BP    Dispense:  90 tablet    Refill:  1    Time spent:30 Minutes  Dr FLavera GuiseInternal medicine

## 2019-02-15 ENCOUNTER — Other Ambulatory Visit: Payer: Self-pay

## 2019-02-15 DIAGNOSIS — F321 Major depressive disorder, single episode, moderate: Secondary | ICD-10-CM

## 2019-02-15 MED ORDER — FLUOXETINE HCL 40 MG PO CAPS: 40.0000 mg | ORAL_CAPSULE | Freq: Two times a day (BID) | ORAL | 1 refills | Status: DC

## 2019-03-09 ENCOUNTER — Encounter: Payer: Self-pay | Admitting: Internal Medicine

## 2019-03-09 ENCOUNTER — Ambulatory Visit: Payer: PPO | Admitting: Internal Medicine

## 2019-03-09 ENCOUNTER — Other Ambulatory Visit: Payer: Self-pay

## 2019-03-09 DIAGNOSIS — R202 Paresthesia of skin: Secondary | ICD-10-CM | POA: Diagnosis not present

## 2019-03-09 DIAGNOSIS — R200 Anesthesia of skin: Secondary | ICD-10-CM | POA: Diagnosis not present

## 2019-03-09 DIAGNOSIS — I739 Peripheral vascular disease, unspecified: Secondary | ICD-10-CM | POA: Diagnosis not present

## 2019-03-10 LAB — B12 AND FOLATE PANEL
Folate: 15 ng/mL (ref >3.0)
Vitamin B-12: 1131 pg/mL (ref 232–1245)

## 2019-03-10 LAB — RHEUMATOID FACTOR: Rheumatoid fact SerPl-aCnc: <10 [IU]/mL (ref 0.0–13.9)

## 2019-03-10 LAB — URIC ACID: Uric Acid: 4.8 mg/dL (ref 3.7–8.6)

## 2019-03-10 LAB — SEDIMENTATION RATE: Sed Rate: 2 mm/h (ref 0–15)

## 2019-03-10 NOTE — Progress Notes (Signed)
Now we can see him, he did his labs

## 2019-03-15 NOTE — Progress Notes (Signed)
Tried calling patient to [facility] appointment for lab review and unable to reach patient. Beth

## 2019-03-25 ENCOUNTER — Other Ambulatory Visit: Payer: Self-pay | Admitting: Internal Medicine

## 2019-03-25 DIAGNOSIS — F321 Major depressive disorder, single episode, moderate: Secondary | ICD-10-CM

## 2019-03-30 ENCOUNTER — Encounter: Payer: Self-pay | Admitting: Internal Medicine

## 2019-03-30 ENCOUNTER — Other Ambulatory Visit: Payer: Self-pay

## 2019-03-30 ENCOUNTER — Ambulatory Visit (INDEPENDENT_AMBULATORY_CARE_PROVIDER_SITE_OTHER): Payer: PPO | Admitting: Internal Medicine

## 2019-03-30 VITALS — BP 120/76 | HR 64 | Resp 16 | Ht 72.0 in | Wt 172.0 lb

## 2019-03-30 DIAGNOSIS — E039 Hypothyroidism, unspecified: Secondary | ICD-10-CM

## 2019-03-30 DIAGNOSIS — F5102 Adjustment insomnia: Secondary | ICD-10-CM

## 2019-03-30 DIAGNOSIS — F331 Major depressive disorder, recurrent, moderate: Secondary | ICD-10-CM | POA: Diagnosis not present

## 2019-03-30 DIAGNOSIS — I10 Essential (primary) hypertension: Secondary | ICD-10-CM | POA: Diagnosis not present

## 2019-03-30 MED ORDER — MIRTAZAPINE 15 MG PO TABS: ORAL_TABLET | ORAL | 3 refills | Status: DC

## 2019-03-30 NOTE — Progress Notes (Signed)
[facility] [place],  [address]  Internal MEDICINE  Office Visit Note  Patient Name: Jeremy Delgado  [id number]  [id number]  Date of Service: 03/30/2019  Chief Complaint  Patient presents with  . Follow-up    labs  . Hypertension    amlodipine is working well and leg pain has lessened    HPI Pt is here for routine follow up, feels better, started going back to methadone clinic. He is having a little more anxiety and problem falling a sleep, he is stress at home, mother's boyfriend is living in the house and they do t get along. He is taking Prozac 80 mg in am BP is better  Labs are normal as well TSH and T4 results are not available  Current Medication: Outpatient Encounter Medications as of 03/30/2019  Medication Sig Note  . amLODipine (NORVASC) 2.5 MG tablet Take one tab at night for BP   . aspirin EC 81 MG tablet Take by mouth.   . cyclobenzaprine (FLEXERIL) 10 MG tablet TAKE 1 TABLET (10 MG TOTAL) BY MOUTH 2 (TWO) TIMES DAILY AS NEEDED (PAIN)   . Diphenhyd-Hydrocort-Nystatin (FIRST-DUKES MOUTHWASH) SUSP Swish and spit 21mls three times a day   . FLUoxetine (PROZAC) 40 MG capsule Take 1 capsule (40 mg total) by mouth 2 (two) times daily.   Marland Kitchen gabapentin (NEURONTIN) 600 MG tablet TAKE 1 TABLET BY MOUTH TWICE A DAY   . levothyroxine (SYNTHROID, LEVOTHROID) 50 MCG tablet TAKE 1 TABLET BY MOUTH EVERY DAY IN THE MORNING   . Melatonin 3 MG CAPS Take 1 capsule by mouth at bedtime as needed (sleep).  09/27/2015: .   . omeprazole (PRILOSEC) 40 MG capsule Take 1 capsule (40 mg total) by mouth daily.   Marland Kitchen linaclotide (LINZESS) 290 MCG CAPS capsule Take 1 capsule (290 mcg total) by mouth daily before breakfast.   . mirtazapine (REMERON) 15 MG tablet Take one tab po qhs    No facility-administered encounter medications on file as of 03/30/2019.     Surgical History: Past Surgical History:  Procedure Laterality Date  . COLONOSCOPY WITH PROPOFOL N/A  08/14/2018   Procedure: COLONOSCOPY WITH PROPOFOL;  Surgeon: Jonathon Bellows, MD;  [facility];  Service: Gastroenterology;  Laterality: N/A;  . ESOPHAGOGASTRODUODENOSCOPY (EGD) WITH PROPOFOL N/A 08/14/2018   Procedure: ESOPHAGOGASTRODUODENOSCOPY (EGD) WITH PROPOFOL;  Surgeon: Jonathon Bellows, MD;  Location: [facility];  Service: Gastroenterology;  Laterality: N/A;  . HEMORRHOID SURGERY      Medical History: Past Medical History:  Diagnosis Date  . Addiction to drug ([place]) 04/06/2015  . ANXIETY 03/11/2008   Qualifier: Diagnosis of  By: Hassell Done FNP, Tori Milks     . Arthritis   . BACK PAIN 02/17/2007   Qualifier: Diagnosis of  By: Silvio Pate MD, Baird Cancer   . Chronic pain   . Colitis    self  . DEPRESSION 11/05/2003   Qualifier: Diagnosis of  By: Hassell Done FNP, Tori Milks    . GERD 12/29/2008   Qualifier: Diagnosis of  By: Hassell Done FNP, Tori Milks    . H/O arthritis 02/20/2015  . H/O: hypothyroidism 02/20/2015  . HYPERCHOLESTEROLEMIA 03/11/2008   Qualifier: Diagnosis of  By: Hassell Done FNP, Tori Milks    . Hypertension   . PAIN IN JOINT PELVIC REGION AND THIGH 03/11/2008   Qualifier: Diagnosis of  By: Hassell Done FNP, Tori Milks    . Renal disorder   . Sciatica   . Thyroid disease   . TOBACCO ABUSE 07/15/2008   Qualifier: Diagnosis  of  By: Hassell Done FNP, Tori Milks      Family History: Family History  Problem Relation Age of Onset  . Depression Mother   . Hypertension Father   . Heart failure Father   . Diabetes Paternal Grandfather     Social History   Socioeconomic History  . Marital status: Single    Spouse name: Not on file  . Number of children: Not on file  . Years of education: Not on file  . Highest education level: Not on file  Occupational History  . Not on file  Social Needs  . Financial resource strain: Not on file  . Food insecurity:    Worry: Not on file    Inability: Not on file  . Transportation needs:    Medical: Not on file    Non-medical: Not on file  Tobacco Use  .  Smoking status: Current Every Day Smoker    Packs/day: 0.50    Types: Cigarettes    Start date: 07/05/1990  . Smokeless tobacco: Never Used  Substance and Sexual Activity  . Alcohol use: Yes    Alcohol/week: 0.0 standard drinks    Comment: ocassionally  . Drug use: Yes    Frequency: 2.0 times per week    Types: Marijuana  . Sexual activity: Yes    Birth control/protection: Condom  Lifestyle  . Physical activity:    Days per week: Not on file    Minutes per session: Not on file  . Stress: Not on file  Relationships  . Social connections:    Talks on phone: Not on file    Gets together: Not on file    Attends religious service: Not on file    Active member of club or organization: Not on file    Attends meetings of clubs or organizations: Not on file    Relationship status: Not on file  . Intimate partner violence:    Fear of current or ex partner: Not on file    Emotionally abused: Not on file    Physically abused: Not on file    Forced sexual activity: Not on file  Other Topics Concern  . Not on file  Social History Narrative  . Not on file    Review of Systems  Constitutional: Negative for chills, fatigue and unexpected weight change.  HENT: Negative for congestion, postnasal drip, rhinorrhea and sore throat.   Eyes: Negative for redness.  Respiratory: Negative for cough and shortness of breath.   Cardiovascular: Negative for chest pain.  Gastrointestinal: Negative for abdominal pain, constipation, diarrhea, nausea and vomiting.  Genitourinary: Negative for dysuria and frequency.  Musculoskeletal: Positive for arthralgias. Negative for back pain, joint swelling and neck pain.  Skin: Negative for rash.  Neurological: Negative.  Negative for tremors and numbness.  Hematological: Negative for adenopathy. Does not bruise/bleed easily.  Psychiatric/Behavioral: Positive for agitation and sleep disturbance. Negative for behavioral problems (Depression) and suicidal ideas.  The patient is not nervous/anxious.     Vital Signs: BP 120/76   Pulse 64   Resp 16   Ht 6' (1.829 m)   Wt 172 lb (78 kg)   SpO2 96%   BMI 23.33 kg/m    Physical Exam Constitutional:      General: He is not in acute distress.    Appearance: He is well-developed. He is not diaphoretic.  HENT:     Head: Normocephalic and atraumatic.     Mouth/Throat:     Pharynx: No oropharyngeal exudate.  Eyes:     Pupils: Pupils are equal, round, and reactive to light.  Neck:     Musculoskeletal: Normal range of motion and neck supple.     Thyroid: No thyromegaly.     Vascular: No JVD.     Trachea: No tracheal deviation.  Cardiovascular:     Rate and Rhythm: Normal rate and regular rhythm.     Heart sounds: Normal heart sounds. No murmur. No friction rub. No gallop.   Pulmonary:     Effort: Pulmonary effort is normal. No respiratory distress.     Breath sounds: No wheezing or rales.  Chest:     Chest wall: No tenderness.  Abdominal:     General: Bowel sounds are normal.     Palpations: Abdomen is soft.  Musculoskeletal: Normal range of motion.  Lymphadenopathy:     Cervical: No cervical adenopathy.  Skin:    General: Skin is warm and dry.  Neurological:     Mental Status: He is alert and oriented to person, place, and time.     Cranial Nerves: No cranial nerve deficit.  Psychiatric:        Behavior: Behavior normal.        Thought Content: Thought content normal.        Judgment: Judgment normal.    Assessment/Plan: 1. Hypothyroidism, unspecified type Recheck TSH and free T4. Lab slip is given  2. Essential hypertension, benign Controlled with Norvasc   3. Moderate episode of recurrent major depressive disorder (HCC) Decrease Prozac due to excessive anxiety to 40 mg po qam   4. Adjustment insomnia  add and titrate Remeron as indicated  - mirtazapine (REMERON) 15 MG tablet; Take one tab po qhs  Dispense: 30 tablet; Refill: 3  General Counseling: wells mabe  understanding of the findings of todays visit and agrees with plan of treatment. I have discussed any further diagnostic evaluation that may be needed or ordered today. We also reviewed his medications today. he has been encouraged to call the office with any questions or concerns that should arise related to todays visit.  Meds ordered this encounter  Medications  . mirtazapine (REMERON) 15 MG tablet    Sig: Take one tab po qhs    Dispense:  30 tablet    Refill:  3    Time spent:25 Minutes   Dr Lavera Guise Internal medicine

## 2019-04-16 ENCOUNTER — Other Ambulatory Visit: Payer: Self-pay | Admitting: Nurse Practitioner

## 2019-04-16 DIAGNOSIS — G8929 Other chronic pain: Secondary | ICD-10-CM

## 2019-04-16 MED ORDER — GABAPENTIN 600 MG PO TABS: 600.0000 mg | ORAL_TABLET | Freq: Two times a day (BID) | ORAL | 0 refills | Status: DC

## 2019-04-21 ENCOUNTER — Other Ambulatory Visit: Payer: Self-pay | Admitting: Internal Medicine

## 2019-04-21 DIAGNOSIS — F5102 Adjustment insomnia: Secondary | ICD-10-CM

## 2019-05-03 ENCOUNTER — Ambulatory Visit (INDEPENDENT_AMBULATORY_CARE_PROVIDER_SITE_OTHER): Payer: PPO | Admitting: Internal Medicine

## 2019-05-03 ENCOUNTER — Other Ambulatory Visit: Payer: Self-pay

## 2019-05-03 ENCOUNTER — Encounter: Payer: Self-pay | Admitting: Internal Medicine

## 2019-05-03 DIAGNOSIS — I10 Essential (primary) hypertension: Secondary | ICD-10-CM | POA: Diagnosis not present

## 2019-05-03 DIAGNOSIS — B369 Superficial mycosis, unspecified: Secondary | ICD-10-CM

## 2019-05-03 DIAGNOSIS — F331 Major depressive disorder, recurrent, moderate: Secondary | ICD-10-CM | POA: Diagnosis not present

## 2019-05-03 DIAGNOSIS — E039 Hypothyroidism, unspecified: Secondary | ICD-10-CM

## 2019-05-03 MED ORDER — CLOTRIMAZOLE-BETAMETHASONE 1-0.05 % EX CREA: 1.0000 "application " | TOPICAL_CREAM | Freq: Two times a day (BID) | CUTANEOUS | 0 refills | Status: DC

## 2019-05-03 NOTE — Progress Notes (Signed)
[facility] [place] [place], [place] [address]  Internal MEDICINE  Office Visit Note  Patient Name: Jeremy Delgado  [id number]  [id number]  Date of Service: 05/07/2019  Chief Complaint  Patient presents with  . Medical Management of Chronic Issues    sore on left ankle     HPI Pt is here for routine follow up. He feels better, started with methadone clinic. Bp is pain is under better control, notice rash on his left ankle, itching and burning, Denies  any trauma   No fever or chills  Current Medication: Outpatient Encounter Medications as of 05/03/2019  Medication Sig Note  . amLODipine (NORVASC) 2.5 MG tablet Take one tab at night for BP   . aspirin EC 81 MG tablet Take by mouth.   . cyclobenzaprine (FLEXERIL) 10 MG tablet TAKE 1 TABLET (10 MG TOTAL) BY MOUTH 2 (TWO) TIMES DAILY AS NEEDED (PAIN)   . FLUoxetine (PROZAC) 40 MG capsule Take 1 capsule (40 mg total) by mouth 2 (two) times daily.   Marland Kitchen gabapentin (NEURONTIN) 600 MG tablet Take 1 tablet (600 mg total) by mouth 2 (two) times daily.   . Melatonin 3 MG CAPS Take 1 capsule by mouth at bedtime as needed (sleep).  09/27/2015: .   . mirtazapine (REMERON) 15 MG tablet TAKE 1 TABLET BY MOUTH EVERYDAY AT BEDTIME   . omeprazole (PRILOSEC) 40 MG capsule Take 1 capsule (40 mg total) by mouth daily.   . [DISCONTINUED] levothyroxine (SYNTHROID, LEVOTHROID) 50 MCG tablet TAKE 1 TABLET BY MOUTH EVERY DAY IN THE MORNING   . clotrimazole-betamethasone (LOTRISONE) cream Apply 1 application topically 2 (two) times daily.   . Diphenhyd-Hydrocort-Nystatin (FIRST-DUKES MOUTHWASH) SUSP Swish and spit 25mls three times a day (Patient not taking: Reported on 05/03/2019)   . linaclotide (LINZESS) 290 MCG CAPS capsule Take 1 capsule (290 mcg total) by mouth daily before breakfast.    No facility-administered encounter medications on file as of 05/03/2019.     Surgical History: Past Surgical History:  Procedure Laterality Date  .  COLONOSCOPY WITH PROPOFOL N/A 08/14/2018   Procedure: COLONOSCOPY WITH PROPOFOL;  Surgeon: Jonathon Bellows, MD;  Location: [facility] ENDOSCOPY;  Service: Gastroenterology;  Laterality: N/A;  . ESOPHAGOGASTRODUODENOSCOPY (EGD) WITH PROPOFOL N/A 08/14/2018   Procedure: ESOPHAGOGASTRODUODENOSCOPY (EGD) WITH PROPOFOL;  Surgeon: Jonathon Bellows, MD;  Location: [facility] ENDOSCOPY;  Service: Gastroenterology;  Laterality: N/A;  . HEMORRHOID SURGERY      Medical History: Past Medical History:  Diagnosis Date  . Addiction to drug ([place]) 04/06/2015  . ANXIETY 03/11/2008   Qualifier: Diagnosis of  By: Hassell Done FNP, Tori Milks     . Arthritis   . BACK PAIN 02/17/2007   Qualifier: Diagnosis of  By: Silvio Pate MD, Baird Cancer   . Chronic pain   . Colitis    self  . DEPRESSION 11/05/2003   Qualifier: Diagnosis of  By: Hassell Done FNP, Tori Milks    . GERD 12/29/2008   Qualifier: Diagnosis of  By: Hassell Done FNP, Tori Milks    . H/O arthritis 02/20/2015  . H/O: hypothyroidism 02/20/2015  . HYPERCHOLESTEROLEMIA 03/11/2008   Qualifier: Diagnosis of  By: Hassell Done FNP, Tori Milks    . Hypertension   . PAIN IN JOINT PELVIC REGION AND THIGH 03/11/2008   Qualifier: Diagnosis of  By: Hassell Done FNP, Tori Milks    . Renal disorder   . Sciatica   . Thyroid disease   . TOBACCO ABUSE 07/15/2008   Qualifier: Diagnosis of  By: Hassell Done FNP, Tori Milks  Family History: Family History  Problem Relation Age of Onset  . Depression Mother   . Hypertension Father   . Heart failure Father   . Diabetes Paternal Grandfather     Social History   Socioeconomic History  . Marital status: Single    Spouse name: Not on file  . Number of children: Not on file  . Years of education: Not on file  . Highest education level: Not on file  Occupational History  . Not on file  Social Needs  . Financial resource strain: Not on file  . Food insecurity    Worry: Not on file    Inability: Not on file  . Transportation needs    Medical: Not on file    Non-medical: Not on  file  Tobacco Use  . Smoking status: Current Every Day Smoker    Packs/day: 0.50    Types: Cigarettes    Start date: 07/05/1990  . Smokeless tobacco: Never Used  Substance and Sexual Activity  . Alcohol use: Yes    Alcohol/week: 0.0 standard drinks    Comment: ocassionally  . Drug use: Yes    Frequency: 2.0 times per week    Types: Marijuana  . Sexual activity: Yes    Birth control/protection: Condom  Lifestyle  . Physical activity    Days per week: Not on file    Minutes per session: Not on file  . Stress: Not on file  Relationships  . Social Herbalist on phone: Not on file    Gets together: Not on file    Attends religious service: Not on file    Active member of club or organization: Not on file    Attends meetings of clubs or organizations: Not on file    Relationship status: Not on file  . Intimate partner violence    Fear of current or ex partner: Not on file    Emotionally abused: Not on file    Physically abused: Not on file    Forced sexual activity: Not on file  Other Topics Concern  . Not on file  Social History Narrative  . Not on file      Review of Systems  Constitutional: Negative for chills, fatigue and unexpected weight change.  HENT: Positive for postnasal drip. Negative for congestion, rhinorrhea, sneezing and sore throat.   Eyes: Negative for redness.  Respiratory: Negative for cough, chest tightness and shortness of breath.   Cardiovascular: Negative for chest pain and palpitations.  Gastrointestinal: Negative for abdominal pain, constipation, diarrhea, nausea and vomiting.  Genitourinary: Negative for dysuria and frequency.  Musculoskeletal: Positive for back pain. Negative for arthralgias, joint swelling and neck pain.  Skin: Positive for color change and rash.  Neurological: Negative.  Negative for tremors and numbness.  Hematological: Negative for adenopathy. Does not bruise/bleed easily.  Psychiatric/Behavioral: Negative for  behavioral problems (Depression), sleep disturbance and suicidal ideas. The patient is not nervous/anxious.     Vital Signs: BP 135/78   Pulse 72   Temp 99.3 F (37.4 C)   Resp 16   Ht 6' (1.829 m)   Wt 173 lb (78.5 kg)   SpO2 98%   BMI 23.46 kg/m    Physical Exam Constitutional:      Appearance: Normal appearance.  HENT:     Head: Normocephalic and atraumatic.  Cardiovascular:     Rate and Rhythm: Normal rate and regular rhythm.     Pulses: Normal pulses.  Heart sounds: Normal heart sounds.  Pulmonary:     Effort: Pulmonary effort is normal.     Breath sounds: Normal breath sounds.  Skin:    General: Skin is warm and dry.     Findings: Rash (left ankle about 1 inch in diameter, scales with erythema ) present.  Neurological:     Mental Status: He is alert.  Psychiatric:        Mood and Affect: Mood normal.    Assessment/Plan: 1. Fungal dermatosis Start  clotrimazole-betamethasone (LOTRISONE) cream; Apply 1 application topically 2 (two) times daily.  Dispense: 30 g; Refill: 0  2. Hypothyroidism, unspecified type Continue synthroid, check TSH + free T4  3. Essential hypertension, benign Continue norvasc as before   4. Moderate episode of recurrent major depressive disorder ([place]) Controlled and improved, continue with mthadone clinic   General Counseling: wael maestas understanding of the findings of todays visit and agrees with plan of treatment. I have discussed any further diagnostic evaluation that may be needed or ordered today. We also reviewed his medications today. he has been encouraged to call the office with any questions or concerns that should arise related to todays visit.    Orders Placed This Encounter  Procedures  . TSH + free T4    Meds ordered this encounter  Medications  . clotrimazole-betamethasone (LOTRISONE) cream    Sig: Apply 1 application topically 2 (two) times daily.    Dispense:  30 g    Refill:  0    Time spent:25  Minutes  Dr Lavera Guise Internal medicine

## 2019-05-04 ENCOUNTER — Other Ambulatory Visit: Payer: Self-pay | Admitting: Nurse Practitioner

## 2019-05-04 DIAGNOSIS — E039 Hypothyroidism, unspecified: Secondary | ICD-10-CM

## 2019-05-04 MED ORDER — LEVOTHYROXINE SODIUM 50 MCG PO TABS: ORAL_TABLET | ORAL | 1 refills | Status: DC

## 2019-05-13 ENCOUNTER — Ambulatory Visit: Payer: PPO | Admitting: Adult Health

## 2019-05-15 ENCOUNTER — Other Ambulatory Visit: Payer: Self-pay | Admitting: Internal Medicine

## 2019-07-13 ENCOUNTER — Other Ambulatory Visit: Payer: Self-pay | Admitting: Internal Medicine

## 2019-07-13 ENCOUNTER — Telehealth: Payer: Self-pay | Admitting: Internal Medicine

## 2019-07-13 DIAGNOSIS — G8929 Other chronic pain: Secondary | ICD-10-CM

## 2019-07-13 MED ORDER — GABAPENTIN 600 MG PO TABS: 600.0000 mg | ORAL_TABLET | Freq: Two times a day (BID) | ORAL | 0 refills | Status: DC

## 2019-07-13 NOTE — Telephone Encounter (Signed)
Refill request

## 2019-07-27 ENCOUNTER — Ambulatory Visit: Payer: PPO | Admitting: Internal Medicine

## 2019-08-03 ENCOUNTER — Ambulatory Visit: Payer: PPO | Admitting: Internal Medicine

## 2019-11-09 ENCOUNTER — Ambulatory Visit: Payer: Medicare Other | Attending: Internal Medicine

## 2019-11-09 DIAGNOSIS — Z20822 Contact with and (suspected) exposure to covid-19: Secondary | ICD-10-CM

## 2019-11-10 LAB — NOVEL CORONAVIRUS, NAA: SARS-CoV-2, NAA: NOT DETECTED

## 2019-11-18 ENCOUNTER — Other Ambulatory Visit: Payer: Self-pay | Admitting: Internal Medicine

## 2019-11-18 DIAGNOSIS — G8929 Other chronic pain: Secondary | ICD-10-CM

## 2019-11-18 NOTE — Telephone Encounter (Signed)
Pt need appt for refills  ?

## 2019-12-06 ENCOUNTER — Other Ambulatory Visit: Payer: Self-pay | Admitting: Internal Medicine

## 2019-12-06 DIAGNOSIS — F321 Major depressive disorder, single episode, moderate: Secondary | ICD-10-CM

## 2019-12-16 ENCOUNTER — Telehealth: Payer: Self-pay

## 2019-12-16 NOTE — Telephone Encounter (Signed)
Confirmed appointment with patient and screened for covid. klh 

## 2019-12-20 ENCOUNTER — Other Ambulatory Visit: Payer: Self-pay

## 2019-12-20 ENCOUNTER — Ambulatory Visit: Payer: Self-pay | Admitting: Adult Health

## 2019-12-20 ENCOUNTER — Telehealth: Payer: Self-pay

## 2019-12-20 DIAGNOSIS — F321 Major depressive disorder, single episode, moderate: Secondary | ICD-10-CM

## 2019-12-20 MED ORDER — FLUOXETINE HCL 40 MG PO CAPS: 40.0000 mg | ORAL_CAPSULE | Freq: Two times a day (BID) | ORAL | 0 refills | Status: DC

## 2019-12-20 NOTE — Telephone Encounter (Signed)
Patient rescheduled appointment on 12/21/2019 to 01/10/2020 due to financial issues. klh

## 2019-12-23 ENCOUNTER — Other Ambulatory Visit: Payer: Self-pay

## 2019-12-23 DIAGNOSIS — F321 Major depressive disorder, single episode, moderate: Secondary | ICD-10-CM

## 2019-12-23 MED ORDER — FLUOXETINE HCL 40 MG PO CAPS: 40.0000 mg | ORAL_CAPSULE | Freq: Two times a day (BID) | ORAL | 0 refills | Status: DC

## 2020-01-06 ENCOUNTER — Telehealth: Payer: Self-pay

## 2020-01-06 NOTE — Telephone Encounter (Signed)
Confirmed appointment on 01/10/2020 and screened for covid. klh °

## 2020-01-10 ENCOUNTER — Other Ambulatory Visit: Payer: Self-pay

## 2020-01-10 ENCOUNTER — Ambulatory Visit: Payer: Medicare Other | Admitting: Adult Health

## 2020-01-15 ENCOUNTER — Other Ambulatory Visit: Payer: Self-pay | Admitting: Internal Medicine

## 2020-01-17 ENCOUNTER — Other Ambulatory Visit: Payer: Self-pay

## 2020-01-17 ENCOUNTER — Telehealth: Payer: Self-pay

## 2020-01-17 MED ORDER — OMEPRAZOLE 40 MG PO CPDR: 40.0000 mg | DELAYED_RELEASE_CAPSULE | Freq: Every day | ORAL | 0 refills | Status: DC

## 2020-01-17 NOTE — Telephone Encounter (Signed)
Confirmed appointment on 01/19/2020 and screened for covid. klh  

## 2020-01-19 ENCOUNTER — Other Ambulatory Visit: Payer: Self-pay

## 2020-01-19 ENCOUNTER — Ambulatory Visit (INDEPENDENT_AMBULATORY_CARE_PROVIDER_SITE_OTHER): Payer: Medicare Other | Admitting: Adult Health

## 2020-01-19 ENCOUNTER — Encounter: Payer: Self-pay | Admitting: Adult Health

## 2020-01-19 VITALS — BP 145/88 | HR 64 | Temp 93.1°F | Resp 16 | Ht 72.0 in | Wt 204.4 lb

## 2020-01-19 DIAGNOSIS — Z0001 Encounter for general adult medical examination with abnormal findings: Secondary | ICD-10-CM | POA: Diagnosis not present

## 2020-01-19 DIAGNOSIS — E039 Hypothyroidism, unspecified: Secondary | ICD-10-CM

## 2020-01-19 DIAGNOSIS — F17219 Nicotine dependence, cigarettes, with unspecified nicotine-induced disorders: Secondary | ICD-10-CM | POA: Diagnosis not present

## 2020-01-19 DIAGNOSIS — F321 Major depressive disorder, single episode, moderate: Secondary | ICD-10-CM

## 2020-01-19 DIAGNOSIS — K219 Gastro-esophageal reflux disease without esophagitis: Secondary | ICD-10-CM

## 2020-01-19 DIAGNOSIS — F5102 Adjustment insomnia: Secondary | ICD-10-CM

## 2020-01-19 DIAGNOSIS — K581 Irritable bowel syndrome with constipation: Secondary | ICD-10-CM

## 2020-01-19 DIAGNOSIS — G8929 Other chronic pain: Secondary | ICD-10-CM

## 2020-01-19 DIAGNOSIS — I10 Essential (primary) hypertension: Secondary | ICD-10-CM

## 2020-01-19 DIAGNOSIS — R5383 Other fatigue: Secondary | ICD-10-CM | POA: Diagnosis not present

## 2020-01-19 DIAGNOSIS — E559 Vitamin D deficiency, unspecified: Secondary | ICD-10-CM

## 2020-01-19 DIAGNOSIS — R300 Dysuria: Secondary | ICD-10-CM

## 2020-01-19 MED ORDER — MIRTAZAPINE 15 MG PO TABS: ORAL_TABLET | ORAL | 2 refills | Status: DC

## 2020-01-19 MED ORDER — OMEPRAZOLE 40 MG PO CPDR: 40.0000 mg | DELAYED_RELEASE_CAPSULE | Freq: Every day | ORAL | 2 refills | Status: DC

## 2020-01-19 MED ORDER — FLUOXETINE HCL 40 MG PO CAPS: 40.0000 mg | ORAL_CAPSULE | Freq: Every day | ORAL | 2 refills | Status: DC

## 2020-01-19 MED ORDER — GABAPENTIN 600 MG PO TABS: 600.0000 mg | ORAL_TABLET | Freq: Two times a day (BID) | ORAL | 2 refills | Status: DC

## 2020-01-19 MED ORDER — LEVOTHYROXINE SODIUM 50 MCG PO TABS: ORAL_TABLET | ORAL | 0 refills | Status: DC

## 2020-01-19 MED ORDER — AMLODIPINE BESYLATE 2.5 MG PO TABS: 2.5000 mg | ORAL_TABLET | Freq: Every day | ORAL | 2 refills | Status: DC

## 2020-01-19 NOTE — Progress Notes (Signed)
[facility] [place], [place] [address]  Internal MEDICINE  Office Visit Note  Patient Name: Jeremy Delgado  [id number]  [id number]  Date of Service: 01/19/2020  Chief Complaint  Patient presents with  . Medicare Wellness  . Hyperlipidemia  . Hypertension  . Depression  . Gastroesophageal Reflux  . Leg Pain    swelling  . Foot Pain  . Medication Refill     HPI Pt is here for routine health maintenance examination.  He a well appearing 49 yo male.  He has a history of HTN, HLD, depression, GERD, chronic pain, hypothyroid, Insomnia and IBS. His bp is slightly elevated 145/88.  He Denies Chest pain, Shortness of breath, palpitations, headache, or blurred vision. He reports his depression is currently controlled with prozac.  He denies any new or worsening symptoms.  His GERD is currently controlled using ompeprazole.  He manages his IBS with diet and linzess. He continues to take synthroid for his thyroid, will recheck level at this time.  He uses remeron to help him sleep.     Current Medication: Outpatient Encounter Medications as of 01/19/2020  Medication Sig Note  . amLODipine (NORVASC) 2.5 MG tablet TAKE 1 TABLET BY MOUTH AT BEDTIME FOR BLOOD PRESSURE   . aspirin EC 81 MG tablet Take by mouth.   . clotrimazole-betamethasone (LOTRISONE) cream Apply 1 application topically 2 (two) times daily.   . cyclobenzaprine (FLEXERIL) 10 MG tablet TAKE 1 TABLET (10 MG TOTAL) BY MOUTH 2 (TWO) TIMES DAILY AS NEEDED (PAIN)   . Diphenhyd-Hydrocort-Nystatin (FIRST-DUKES MOUTHWASH) SUSP Swish and spit 2mls three times a day   . FLUoxetine (PROZAC) 40 MG capsule Take 1 capsule (40 mg total) by mouth 2 (two) times daily.   Marland Kitchen gabapentin (NEURONTIN) 600 MG tablet TAKE 1 TABLET BY MOUTH TWICE A DAY   . levothyroxine (SYNTHROID) 50 MCG tablet TAKE 1 TABLET BY MOUTH EVERY DAY IN THE MORNING   . Melatonin 3 MG CAPS Take 1 capsule by mouth at bedtime as needed (sleep).   09/27/2015: .   . mirtazapine (REMERON) 15 MG tablet TAKE 1 TABLET BY MOUTH EVERYDAY AT BEDTIME   . omeprazole (PRILOSEC) 40 MG capsule Take 1 capsule (40 mg total) by mouth daily.   Marland Kitchen linaclotide (LINZESS) 290 MCG CAPS capsule Take 1 capsule (290 mcg total) by mouth daily before breakfast.    No facility-administered encounter medications on file as of 01/19/2020.    Surgical History: Past Surgical History:  Procedure Laterality Date  . COLONOSCOPY WITH PROPOFOL N/A 08/14/2018   Procedure: COLONOSCOPY WITH PROPOFOL;  Surgeon: Jonathon Bellows, MD;  Location[facility] ENDOSCOPY;  Service: Gastroenterology;  Laterality: N/A;  . ESOPHAGOGASTRODUODENOSCOPY (EGD) WITH PROPOFOL N/A 08/14/2018   Procedure: ESOPHAGOGASTRODUODENOSCOPY (EGD) WITH PROPOFOL;  Surgeon: Jonathon Bellows, MD;  Location: [facility] ENDOSCOPY;  Service: Gastroenterology;  Laterality: N/A;  . HEMORRHOID SURGERY      Medical History: Past Medical History:  Diagnosis Date  . Addiction to drug ([place]) 04/06/2015  . ANXIETY 03/11/2008   Qualifier: Diagnosis of  By: Hassell Done FNP, Tori Milks     . Arthritis   . BACK PAIN 02/17/2007   Qualifier: Diagnosis of  By: Silvio Pate MD, Baird Cancer   . Chronic pain   . Colitis    self  . DEPRESSION 11/05/2003   Qualifier: Diagnosis of  By: Hassell Done FNP, Tori Milks    . GERD 12/29/2008   Qualifier: Diagnosis of  By: Hassell Done FNP, Tori Milks    . H/O arthritis  02/20/2015  . H/O: hypothyroidism 02/20/2015  . HYPERCHOLESTEROLEMIA 03/11/2008   Qualifier: Diagnosis of  By: Hassell Done FNP, Tori Milks    . Hypertension   . PAIN IN JOINT PELVIC REGION AND THIGH 03/11/2008   Qualifier: Diagnosis of  By: Hassell Done FNP, Tori Milks    . Renal disorder   . Sciatica   . Thyroid disease   . TOBACCO ABUSE 07/15/2008   Qualifier: Diagnosis of  By: Hassell Done FNP, Tori Milks      Family History: Family History  Problem Relation Age of Onset  . Depression Mother   . Hypertension Father   . Heart failure Father   . Diabetes Paternal Grandfather        Review of Systems  Constitutional: Negative.  Negative for chills, fatigue and unexpected weight change.  HENT: Negative.  Negative for congestion, rhinorrhea, sneezing and sore throat.   Eyes: Negative for redness.  Respiratory: Negative.  Negative for cough, chest tightness and shortness of breath.   Cardiovascular: Negative.  Negative for chest pain and palpitations.  Gastrointestinal: Negative.  Negative for abdominal pain, constipation, diarrhea, nausea and vomiting.  Endocrine: Negative.   Genitourinary: Negative.  Negative for dysuria and frequency.  Musculoskeletal: Negative.  Negative for arthralgias, back pain, joint swelling and neck pain.  Skin: Negative.  Negative for rash.  Allergic/Immunologic: Negative.   Neurological: Negative.  Negative for tremors and numbness.  Hematological: Negative for adenopathy. Does not bruise/bleed easily.  Psychiatric/Behavioral: Negative.  Negative for behavioral problems, sleep disturbance and suicidal ideas. The patient is not nervous/anxious.      Vital Signs: BP (!) 145/88   Pulse 64   Temp (!) 93.1 F (33.9 C)   Resp 16   Ht 6' (1.829 m)   Wt 204 lb 6.4 oz (92.7 kg)   SpO2 98%   BMI 27.72 kg/m    Physical Exam Vitals and nursing note reviewed.  Constitutional:      General: He is not in acute distress.    Appearance: He is well-developed. He is not diaphoretic.  HENT:     Head: Normocephalic and atraumatic.     Mouth/Throat:     Pharynx: No oropharyngeal exudate.  Eyes:     Pupils: Pupils are equal, round, and reactive to light.  Neck:     Thyroid: No thyromegaly.     Vascular: No JVD.     Trachea: No tracheal deviation.  Cardiovascular:     Rate and Rhythm: Normal rate and regular rhythm.     Heart sounds: Normal heart sounds. No murmur. No friction rub. No gallop.   Pulmonary:     Effort: Pulmonary effort is normal. No respiratory distress.     Breath sounds: Normal breath sounds. No wheezing or rales.   Chest:     Chest wall: No tenderness.  Abdominal:     Palpations: Abdomen is soft.     Tenderness: There is no abdominal tenderness. There is no guarding.  Musculoskeletal:        General: Normal range of motion.     Cervical back: Normal range of motion and neck supple.  Lymphadenopathy:     Cervical: No cervical adenopathy.  Skin:    General: Skin is warm and dry.  Neurological:     Mental Status: He is alert and oriented to person, place, and time.     Cranial Nerves: No cranial nerve deficit.  Psychiatric:        Behavior: Behavior normal.        Thought  Content: Thought content normal.        Judgment: Judgment normal.      LABS: Recent Results (from the past 2160 hour(s))  Novel Coronavirus, NAA ([facility])     Status: None   Collection Time: 11/09/19  8:23 AM   Specimen: Nasopharyngeal(NP) swabs in vial transport medium   NASOPHARYNGE  TESTING  Result Value Ref Range   SARS-CoV-2, NAA Not Detected Not Detected    Comment: This nucleic acid amplification test was developed and its performance characteristics determined by [employer]. Nucleic acid amplification tests include PCR and TMA. This test has not been FDA cleared or approved. This test has been authorized by FDA under an Emergency Use Authorization (EUA). This test is only authorized for the duration of time the declaration that circumstances exist justifying the authorization of the emergency use of in vitro diagnostic tests for detection of SARS-CoV-2 virus and/or diagnosis of COVID-19 infection under section 564(b)(1) of the Act, 21 U.S.C. PT:2852782) (1), unless the authorization is terminated or revoked sooner. When diagnostic testing is negative, the possibility of a false negative result should be considered in the context of a patient's recent exposures and the presence of clinical signs and symptoms consistent with COVID-19. An individual without symptoms of COVID-19 and who is not  shedding SARS-CoV-2 virus would  expect to have a negative (not detected) result in this assay.      Assessment/Plan: 1. Encounter for general adult medical examination with abnormal findings Up to date on pHM, Check yearly labs. - CBC with Differential/Platelet - Lipid Panel With LDL/HDL Ratio - TSH - T4, free - Comprehensive metabolic panel - [id number] and Folate Panel - Vitamin D (25 hydroxy) - Fe+TIBC+Fer  2. Other fatigue Check labs, and follow up when results available. - B12 and Folate Panel - Vitamin D (25 hydroxy) - Fe+TIBC+Fer  3. Other chronic pain Stable, continue Neurontin as prescribed.  - gabapentin (NEURONTIN) 600 MG tablet; Take 1 tablet (600 mg total) by mouth 2 (two) times daily.  Dispense: 60 tablet; Refill: 2  4. Current moderate episode of major depressive disorder, unspecified whether recurrent (H[facility]) Continue Prozac - FLUoxetine (PROZAC) 40 MG capsule; Take 1 capsule (40 mg total) by mouth daily.  Dispense: 90 capsule; Refill: 2  5. Adjustment insomnia Continue Remeron - mirtazapine (REMERON) 15 MG tablet; TAKE 1 TABLET BY MOUTH EVERYDAY AT BEDTIME  Dispense: 90 tablet; Refill: 2  6. Acquired hypothyroidism Check thyroid, and adjust medication as needed  7. Gastroesophageal reflux disease without esophagitis Refilled Prilosec, good control of symptoms.  - omeprazole (PRILOSEC) 40 MG capsule; Take 1 capsule (40 mg total) by mouth daily.  Dispense: 90 capsule; Refill: 2 - Fe+TIBC+Fer  8. Essential hypertension, benign Refilled Norvasc, BP mostly controlled.  Pt encouraged to bring bp log to next visit.  - amLODipine (NORVASC) 2.5 MG tablet; Take 1 tablet (2.5 mg total) by mouth daily.  Dispense: 90 tablet; Refill: 2 - CBC with Differential/Platelet  9. Vitamin D deficiency Check Vit D level - Vitamin D (25 hydroxy)  10. Irritable bowel syndrome with constipation  Recheck B12 and folate. Continue current management.  - B12 and Folate Panel  11.  Cigarette nicotine dependence with nicotine-induced disorder Smoking cessation counseling: 1. Pt acknowledges the risks of long term smoking, she will try to quite smoking. 2. Options for different medications including nicotine products, chewing gum, patch etc, Wellbutrin and Chantix is discussed 3. Goal and date of compete cessation is discussed 4. Total time spent  in smoking cessation is 15 min.   12. Dysuria - UA/M w/rflx Culture, Routine  General Counseling: rajah whitby understanding of the findings of todays visit and agrees with plan of treatment. I have discussed any further diagnostic evaluation that may be needed or ordered today. We also reviewed his medications today. he has been encouraged to call the office with any questions or concerns that should arise related to todays visit.   Orders Placed This Encounter  Procedures  . UA/M w/rflx Culture, Routine    No orders of the defined types were placed in this encounter.   Time spent: 30 Minutes   This patient was seen by Orson Gear AGNP-C in Collaboration with Dr Lavera Guise as a part of collaborative care agreement    Kendell Bane AGNP-C Internal Medicine

## 2020-01-20 LAB — UA/M W/RFLX CULTURE, ROUTINE
Bilirubin, UA: NEGATIVE
Glucose, UA: NEGATIVE
Ketones, UA: NEGATIVE
Leukocytes,UA: NEGATIVE
Nitrite, UA: NEGATIVE
Protein,UA: NEGATIVE
RBC, UA: NEGATIVE
Specific Gravity, UA: 1.02 (ref 1.005–1.030)
Urobilinogen, Ur: 0.2 mg/dL (ref 0.2–1.0)
pH, UA: 5.5 (ref 5.0–7.5)

## 2020-01-20 LAB — MICROSCOPIC EXAMINATION
Bacteria, UA: NONE SEEN
Casts: NONE SEEN /lpf
Epithelial Cells (non renal): NONE SEEN /hpf (ref 0–10)
RBC, Urine: NONE SEEN /hpf (ref 0–2)
WBC, UA: NONE SEEN /hpf (ref 0–5)

## 2020-01-29 LAB — CBC WITH DIFFERENTIAL/PLATELET
Basophils Absolute: 0 10*3/uL (ref 0.0–0.2)
Basos: 0 %
EOS (ABSOLUTE): 0.1 10*3/uL (ref 0.0–0.4)
Eos: 2 %
Hematocrit: 43 % (ref 37.5–51.0)
Hemoglobin: 14.9 g/dL (ref 13.0–17.7)
Immature Grans (Abs): 0 10*3/uL (ref 0.0–0.1)
Immature Granulocytes: 1 %
Lymphocytes Absolute: 2.1 10*3/uL (ref 0.7–3.1)
Lymphs: 35 %
MCH: 30.3 pg (ref 26.6–33.0)
MCHC: 34.7 g/dL (ref 31.5–35.7)
MCV: 88 fL (ref 79–97)
Monocytes Absolute: 0.5 10*3/uL (ref 0.1–0.9)
Monocytes: 8 %
Neutrophils Absolute: 3.3 10*3/uL (ref 1.4–7.0)
Neutrophils: 54 %
Platelets: 397 10*3/uL (ref 150–450)
RBC: 4.91 x10E6/uL (ref 4.14–5.80)
RDW: 12.7 % (ref 11.6–15.4)
WBC: 6.1 10*3/uL (ref 3.4–10.8)

## 2020-01-29 LAB — TSH: TSH: 7.02 u[IU]/mL — ABNORMAL HIGH (ref 0.450–4.500)

## 2020-01-29 LAB — COMPREHENSIVE METABOLIC PANEL
ALT: 42 IU/L (ref 0–44)
AST: 23 IU/L (ref 0–40)
Albumin/Globulin Ratio: 1.5 (ref 1.2–2.2)
Albumin: 4.3 g/dL (ref 4.0–5.0)
Alkaline Phosphatase: 122 IU/L — ABNORMAL HIGH (ref 39–117)
BUN/Creatinine Ratio: 18 (ref 9–20)
BUN: 15 mg/dL (ref 6–24)
Bilirubin Total: <0.2 mg/dL (ref 0.0–1.2)
CO2: 23 mmol/L (ref 20–29)
Calcium: 9.5 mg/dL (ref 8.7–10.2)
Chloride: 100 mmol/L (ref 96–106)
Creatinine, Ser: 0.85 mg/dL (ref 0.76–1.27)
GFR calc Af Amer: 119 mL/min/{1.73_m2} (ref >59)
GFR calc non Af Amer: 103 mL/min/{1.73_m2} (ref >59)
Globulin, Total: 2.8 g/dL (ref 1.5–4.5)
Glucose: 119 mg/dL — ABNORMAL HIGH (ref 65–99)
Potassium: 4.2 mmol/L (ref 3.5–5.2)
Sodium: 137 mmol/L (ref 134–144)
Total Protein: 7.1 g/dL (ref 6.0–8.5)

## 2020-01-29 LAB — B12 AND FOLATE PANEL
Folate: 3 ng/mL — ABNORMAL LOW (ref >3.0)
Vitamin B-12: 1275 pg/mL — ABNORMAL HIGH (ref 232–1245)

## 2020-01-29 LAB — LIPID PANEL WITH LDL/HDL RATIO
Cholesterol, Total: 340 mg/dL — ABNORMAL HIGH (ref 100–199)
HDL: 45 mg/dL (ref >39)
LDL Chol Calc (NIH): 240 mg/dL — ABNORMAL HIGH (ref 0–99)
LDL/HDL Ratio: 5.3 ratio — ABNORMAL HIGH (ref 0.0–3.6)
Triglycerides: 265 mg/dL — ABNORMAL HIGH (ref 0–149)
VLDL Cholesterol Cal: 55 mg/dL — ABNORMAL HIGH (ref 5–40)

## 2020-01-29 LAB — IRON,TIBC AND FERRITIN PANEL
Ferritin: 145 ng/mL (ref 30–400)
Iron Saturation: 18 % (ref 15–55)
Iron: 51 ug/dL (ref 38–169)
Total Iron Binding Capacity: 290 ug/dL (ref 250–450)
UIBC: 239 ug/dL (ref 111–343)

## 2020-01-29 LAB — VITAMIN D 25 HYDROXY (VIT D DEFICIENCY, FRACTURES): Vit D, 25-Hydroxy: 27.7 ng/mL — ABNORMAL LOW (ref 30.0–100.0)

## 2020-01-29 LAB — T4, FREE: Free T4: 1.09 ng/dL (ref 0.82–1.77)

## 2020-01-31 ENCOUNTER — Telehealth: Payer: Self-pay

## 2020-01-31 NOTE — Telephone Encounter (Signed)
Confirmed appointment on 02/02/2020 and screened for covid. klh 

## 2020-02-02 ENCOUNTER — Encounter: Payer: Self-pay | Admitting: Adult Health

## 2020-02-02 ENCOUNTER — Ambulatory Visit (INDEPENDENT_AMBULATORY_CARE_PROVIDER_SITE_OTHER): Payer: Medicare Other | Admitting: Adult Health

## 2020-02-02 ENCOUNTER — Other Ambulatory Visit: Payer: Self-pay

## 2020-02-02 VITALS — BP 131/77 | HR 74 | Temp 97.8°F | Resp 16 | Ht 72.0 in | Wt 197.6 lb

## 2020-02-02 DIAGNOSIS — I10 Essential (primary) hypertension: Secondary | ICD-10-CM

## 2020-02-02 DIAGNOSIS — E039 Hypothyroidism, unspecified: Secondary | ICD-10-CM

## 2020-02-02 DIAGNOSIS — K219 Gastro-esophageal reflux disease without esophagitis: Secondary | ICD-10-CM

## 2020-02-02 DIAGNOSIS — M5440 Lumbago with sciatica, unspecified side: Secondary | ICD-10-CM | POA: Diagnosis not present

## 2020-02-02 DIAGNOSIS — E782 Mixed hyperlipidemia: Secondary | ICD-10-CM | POA: Diagnosis not present

## 2020-02-02 DIAGNOSIS — F17219 Nicotine dependence, cigarettes, with unspecified nicotine-induced disorders: Secondary | ICD-10-CM

## 2020-02-02 DIAGNOSIS — G8929 Other chronic pain: Secondary | ICD-10-CM

## 2020-02-02 MED ORDER — FOLIC ACID 1 MG PO TABS: 1.0000 mg | ORAL_TABLET | Freq: Every day | ORAL | 2 refills | Status: AC

## 2020-02-02 MED ORDER — MELOXICAM 15 MG PO TABS: 15.0000 mg | ORAL_TABLET | Freq: Every day | ORAL | 1 refills | Status: DC

## 2020-02-02 MED ORDER — LEVOTHYROXINE SODIUM 88 MCG PO TABS: ORAL_TABLET | ORAL | 0 refills | Status: DC

## 2020-02-02 MED ORDER — ATORVASTATIN CALCIUM 20 MG PO TABS: 20.0000 mg | ORAL_TABLET | Freq: Every day | ORAL | 0 refills | Status: DC

## 2020-02-02 NOTE — Progress Notes (Signed)
[facility] [place], [place] [address]  Internal MEDICINE  Office Visit Note  Patient Name: Jeremy Delgado  [id number]  XN:[id number]  Date of Service: 02/02/2020  Chief Complaint  Patient presents with  . Follow-up    review labs  . Depression  . Gastroesophageal Reflux  . Hyperlipidemia  . Hypertension    HPI  Pt is here for follow up on lab work. His cholesterol is very high, and we discussed statin therapy. His TSH is  Elevated and he will increase his synthroid. He denies any new or worsening symptoms at this time.  He is asking for some meloxicam to help with his chronic back pain.    Current Medication: Outpatient Encounter Medications as of 02/02/2020  Medication Sig Note  . amLODipine (NORVASC) 2.5 MG tablet Take 1 tablet (2.5 mg total) by mouth daily.   Marland Kitchen aspirin EC 81 MG tablet Take by mouth.   . clotrimazole-betamethasone (LOTRISONE) cream Apply 1 application topically 2 (two) times daily.   . cyclobenzaprine (FLEXERIL) 10 MG tablet TAKE 1 TABLET (10 MG TOTAL) BY MOUTH 2 (TWO) TIMES DAILY AS NEEDED (PAIN)   . Diphenhyd-Hydrocort-Nystatin (FIRST-DUKES MOUTHWASH) SUSP Swish and spit 36mls three times a day   . FLUoxetine (PROZAC) 40 MG capsule Take 1 capsule (40 mg total) by mouth daily.   Marland Kitchen gabapentin (NEURONTIN) 600 MG tablet Take 1 tablet (600 mg total) by mouth 2 (two) times daily.   Marland Kitchen levothyroxine (SYNTHROID) 88 MCG tablet TAKE 1 TABLET BY MOUTH EVERY DAY IN THE MORNING   . Melatonin 3 MG CAPS Take 1 capsule by mouth at bedtime as needed (sleep).  09/27/2015: .   . mirtazapine (REMERON) 15 MG tablet TAKE 1 TABLET BY MOUTH EVERYDAY AT BEDTIME   . omeprazole (PRILOSEC) 40 MG capsule Take 1 capsule (40 mg total) by mouth daily.   . [DISCONTINUED] levothyroxine (SYNTHROID) 50 MCG tablet TAKE 1 TABLET BY MOUTH EVERY DAY IN THE MORNING   . atorvastatin (LIPITOR) 20 MG tablet Take 1 tablet (20 mg total) by mouth daily.   Marland Kitchen linaclotide (LINZESS)  290 MCG CAPS capsule Take 1 capsule (290 mcg total) by mouth daily before breakfast.   . meloxicam (MOBIC) 15 MG tablet Take 1 tablet (15 mg total) by mouth daily.    No facility-administered encounter medications on file as of 02/02/2020.    Surgical History: Past Surgical History:  Procedure Laterality Date  . COLONOSCOPY WITH PROPOFOL N/A 08/14/2018   Procedure: COLONOSCOPY WITH PROPOFOL;  Surgeon: Jonathon Bellows, MD;  Location: [facility] ENDOSCOPY;  Service: Gastroenterology;  Laterality: N/A;  . ESOPHAGOGASTRODUODENOSCOPY (EGD) WITH PROPOFOL N/A 08/14/2018   Procedure: ESOPHAGOGASTRODUODENOSCOPY (EGD) WITH PROPOFOL;  Surgeon: Jonathon Bellows, MD;  Location: [facility] ENDOSCOPY;  Service: Gastroenterology;  Laterality: N/A;  . HEMORRHOID SURGERY      Medical History: Past Medical History:  Diagnosis Date  . Addiction to drug ([place]) 04/06/2015  . ANXIETY 03/11/2008   Qualifier: Diagnosis of  By: Hassell Done FNP, Tori Milks     . Arthritis   . BACK PAIN 02/17/2007   Qualifier: Diagnosis of  By: Silvio Pate MD, Baird Cancer   . Chronic pain   . Colitis    self  . DEPRESSION 11/05/2003   Qualifier: Diagnosis of  By: Hassell Done FNP, Tori Milks    . GERD 12/29/2008   Qualifier: Diagnosis of  By: Hassell Done FNP, Tori Milks    . H/O arthritis 02/20/2015  . H/O: hypothyroidism 02/20/2015  . HYPERCHOLESTEROLEMIA 03/11/2008   Qualifier:  Diagnosis of  By: Hassell Done FNP, Tori Milks    . Hypertension   . PAIN IN JOINT PELVIC REGION AND THIGH 03/11/2008   Qualifier: Diagnosis of  By: Hassell Done FNP, Tori Milks    . Renal disorder   . Sciatica   . Thyroid disease   . TOBACCO ABUSE 07/15/2008   Qualifier: Diagnosis of  By: Hassell Done FNP, Tori Milks      Family History: Family History  Problem Relation Age of Onset  . Depression Mother   . Hypertension Father   . Heart failure Father   . Diabetes Paternal Grandfather     Social History   Socioeconomic History  . Marital status: Single    Spouse name: Not on file  . Number of children: Not on  file  . Years of education: Not on file  . Highest education level: Not on file  Occupational History  . Not on file  Tobacco Use  . Smoking status: Current Every Day Smoker    Packs/day: 0.50    Types: Cigarettes    Start date: 07/05/1990  . Smokeless tobacco: Never Used  Substance and Sexual Activity  . Alcohol use: Yes    Alcohol/week: 0.0 standard drinks    Comment: ocassionally  . Drug use: Yes    Frequency: 2.0 times per week    Types: Marijuana  . Sexual activity: Yes    Birth control/protection: Condom  Other Topics Concern  . Not on file  Social History Narrative  . Not on file   Social Determinants of Health   Financial Resource Strain:   . Difficulty of Paying Living Expenses:   Food Insecurity:   . Worried About Charity fundraiser in the Last Year:   . Arboriculturist in the Last Year:   Transportation Needs:   . Film/video editor (Medical):   Marland Kitchen Lack of Transportation (Non-Medical):   Physical Activity:   . Days of Exercise per Week:   . Minutes of Exercise per Session:   Stress:   . Feeling of Stress :   Social Connections:   . Frequency of Communication with Friends and Family:   . Frequency of Social Gatherings with Friends and Family:   . Attends Religious Services:   . Active Member of Clubs or Organizations:   . Attends Archivist Meetings:   Marland Kitchen Marital Status:   Intimate Partner Violence:   . Fear of Current or Ex-Partner:   . Emotionally Abused:   Marland Kitchen Physically Abused:   . Sexually Abused:       Review of Systems  Constitutional: Negative.  Negative for chills, fatigue and unexpected weight change.  HENT: Negative.  Negative for congestion, rhinorrhea, sneezing and sore throat.   Eyes: Negative for redness.  Respiratory: Negative.  Negative for cough, chest tightness and shortness of breath.   Cardiovascular: Negative.  Negative for chest pain and palpitations.  Gastrointestinal: Negative.  Negative for abdominal pain,  constipation, diarrhea, nausea and vomiting.  Endocrine: Negative.   Genitourinary: Negative.  Negative for dysuria and frequency.  Musculoskeletal: Negative.  Negative for arthralgias, back pain, joint swelling and neck pain.  Skin: Negative.  Negative for rash.  Allergic/Immunologic: Negative.   Neurological: Negative.  Negative for tremors and numbness.  Hematological: Negative for adenopathy. Does not bruise/bleed easily.  Psychiatric/Behavioral: Negative.  Negative for behavioral problems, sleep disturbance and suicidal ideas. The patient is not nervous/anxious.     Vital Signs: BP 131/77   Pulse 74   Temp  97.8 F (36.6 C)   Resp 16   Ht 6' (1.829 m)   Wt 197 lb 9.6 oz (89.6 kg)   SpO2 98%   BMI 26.80 kg/m    Physical Exam Vitals and nursing note reviewed.  Constitutional:      General: He is not in acute distress.    Appearance: He is well-developed. He is not diaphoretic.  HENT:     Head: Normocephalic and atraumatic.     Mouth/Throat:     Pharynx: No oropharyngeal exudate.  Eyes:     Pupils: Pupils are equal, round, and reactive to light.  Neck:     Thyroid: No thyromegaly.     Vascular: No JVD.     Trachea: No tracheal deviation.  Cardiovascular:     Rate and Rhythm: Normal rate and regular rhythm.     Heart sounds: Normal heart sounds. No murmur. No friction rub. No gallop.   Pulmonary:     Effort: Pulmonary effort is normal. No respiratory distress.     Breath sounds: Normal breath sounds. No wheezing or rales.  Chest:     Chest wall: No tenderness.  Abdominal:     Palpations: Abdomen is soft.     Tenderness: There is no abdominal tenderness. There is no guarding.  Musculoskeletal:        General: Normal range of motion.     Cervical back: Normal range of motion and neck supple.  Lymphadenopathy:     Cervical: No cervical adenopathy.  Skin:    General: Skin is warm and dry.  Neurological:     Mental Status: He is alert and oriented to person,  place, and time.     Cranial Nerves: No cranial nerve deficit.  Psychiatric:        Behavior: Behavior normal.        Thought Content: Thought content normal.        Judgment: Judgment normal.    Assessment/Plan: 1. Acquired hypothyroidism Increase Synthroid dose, follow up in 8 weeks with redraw of TSH, FT4 - levothyroxine (SYNTHROID) 88 MCG tablet; TAKE 1 TABLET BY MOUTH EVERY DAY IN THE MORNING  Dispense: 90 tablet; Refill: 0  2. Mixed hyperlipidemia Continue Lipitor, start folate. - atorvastatin (LIPITOR) 20 MG tablet; Take 1 tablet (20 mg total) by mouth daily.  Dispense: 90 tablet; Refill: 0 - folic acid (FOLVITE) 1 MG tablet; Take 1 tablet (1 mg total) by mouth daily.  Dispense: 90 tablet; Refill: 2  3. Chronic bilateral low back pain with sciatica, sciatica laterality unspecified Trail of mobic sent - meloxicam (MOBIC) 15 MG tablet; Take 1 tablet (15 mg total) by mouth daily.  Dispense: 30 tablet; Refill: 1  4. Essential hypertension, benign Stable, continue present management.   5. Gastroesophageal reflux disease without esophagitis Controlled, continue present management.  6. Cigarette nicotine dependence with nicotine-induced disorder Smoking cessation counseling: 1. Pt acknowledges the risks of long term smoking, she will try to quite smoking. 2. Options for different medications including nicotine products, chewing gum, patch etc, Wellbutrin and Chantix is discussed 3. Goal and date of compete cessation is discussed 4. Total time spent in smoking cessation is 15 min.   General Counseling: marvin cucinella understanding of the findings of todays visit and agrees with plan of treatment. I have discussed any further diagnostic evaluation that may be needed or ordered today. We also reviewed his medications today. he has been encouraged to call the office with any questions or concerns that should arise  related to todays visit.    No orders of the defined types were  placed in this encounter.   Meds ordered this encounter  Medications  . levothyroxine (SYNTHROID) 88 MCG tablet    Sig: TAKE 1 TABLET BY MOUTH EVERY DAY IN THE MORNING    Dispense:  90 tablet    Refill:  0  . atorvastatin (LIPITOR) 20 MG tablet    Sig: Take 1 tablet (20 mg total) by mouth daily.    Dispense:  90 tablet    Refill:  0  . meloxicam (MOBIC) 15 MG tablet    Sig: Take 1 tablet (15 mg total) by mouth daily.    Dispense:  30 tablet    Refill:  1    Time spent: 25  Minutes   This patient was seen by Orson Gear AGNP-C in Collaboration with Dr Lavera Guise as a part of collaborative care agreement     Kendell Bane AGNP-C Internal medicine

## 2020-02-14 ENCOUNTER — Other Ambulatory Visit: Payer: Self-pay | Admitting: Adult Health

## 2020-02-14 DIAGNOSIS — E039 Hypothyroidism, unspecified: Secondary | ICD-10-CM

## 2020-03-27 ENCOUNTER — Telehealth: Payer: Self-pay

## 2020-03-27 NOTE — Telephone Encounter (Signed)
Confirmed appointment on 03/29/2020 and screened for covid. klh 

## 2020-03-29 ENCOUNTER — Ambulatory Visit: Payer: Medicare Other | Admitting: Adult Health

## 2020-03-29 ENCOUNTER — Other Ambulatory Visit: Payer: Self-pay

## 2020-03-29 ENCOUNTER — Encounter: Payer: Self-pay | Admitting: Adult Health

## 2020-03-29 VITALS — BP 140/85 | HR 74 | Temp 97.0°F | Resp 16 | Ht 72.0 in | Wt 202.0 lb

## 2020-03-29 DIAGNOSIS — I10 Essential (primary) hypertension: Secondary | ICD-10-CM

## 2020-03-29 DIAGNOSIS — F17219 Nicotine dependence, cigarettes, with unspecified nicotine-induced disorders: Secondary | ICD-10-CM

## 2020-03-29 DIAGNOSIS — F1120 Opioid dependence, uncomplicated: Secondary | ICD-10-CM

## 2020-03-29 DIAGNOSIS — E039 Hypothyroidism, unspecified: Secondary | ICD-10-CM | POA: Diagnosis not present

## 2020-03-29 DIAGNOSIS — K219 Gastro-esophageal reflux disease without esophagitis: Secondary | ICD-10-CM

## 2020-03-29 DIAGNOSIS — E782 Mixed hyperlipidemia: Secondary | ICD-10-CM | POA: Diagnosis not present

## 2020-03-29 MED ORDER — ATORVASTATIN CALCIUM 40 MG PO TABS: 40.0000 mg | ORAL_TABLET | Freq: Every day | ORAL | 1 refills | Status: DC

## 2020-03-29 NOTE — Progress Notes (Signed)
[facility] [place], [place] [address]  Internal MEDICINE  Office Visit Note  Patient Name: Jeremy Delgado  [id number]  XN:[id number]  Date of Service: 03/29/2020  Chief Complaint  Patient presents with  . Follow-up  . Depression  . Gastroesophageal Reflux  . Hyperlipidemia  . Hypertension    HPI  Pt is here for follow up on hypothyroid, htn, hld, gerd and depression.  He reports overall he is doing well.  He started statin therapy, and the goal was to increase to 80mg  over time.  He is up to 40mg  and has some GI discomfort so he would like to stay at 40mg  for now.  His gerd is well controlled, and denies any issues. His blood pressure is good today 140/85.  He Denies Chest pain, Shortness of breath, palpitations, headache, or blurred vision. He continues to take methadone daily through pain mgmt.    Current Medication: Outpatient Encounter Medications as of 03/29/2020  Medication Sig Note  . amLODipine (NORVASC) 2.5 MG tablet Take 1 tablet (2.5 mg total) by mouth daily.   Marland Kitchen aspirin EC 81 MG tablet Take by mouth.   Marland Kitchen atorvastatin (LIPITOR) 40 MG tablet Take 1 tablet (40 mg total) by mouth daily.   . clotrimazole-betamethasone (LOTRISONE) cream Apply 1 application topically 2 (two) times daily.   . cyclobenzaprine (FLEXERIL) 10 MG tablet TAKE 1 TABLET (10 MG TOTAL) BY MOUTH 2 (TWO) TIMES DAILY AS NEEDED (PAIN)   . Diphenhyd-Hydrocort-Nystatin (FIRST-DUKES MOUTHWASH) SUSP Swish and spit 52mls three times a day   . FLUoxetine (PROZAC) 40 MG capsule Take 1 capsule (40 mg total) by mouth daily.   . folic acid (FOLVITE) 1 MG tablet Take 1 tablet (1 mg total) by mouth daily.   Marland Kitchen gabapentin (NEURONTIN) 600 MG tablet Take 1 tablet (600 mg total) by mouth 2 (two) times daily.   Marland Kitchen levothyroxine (SYNTHROID) 88 MCG tablet TAKE 1 TABLET BY MOUTH EVERY DAY IN THE MORNING   . Melatonin 3 MG CAPS Take 1 capsule by mouth at bedtime as needed (sleep).  09/27/2015: .   .  meloxicam (MOBIC) 15 MG tablet Take 1 tablet (15 mg total) by mouth daily.   . mirtazapine (REMERON) 15 MG tablet TAKE 1 TABLET BY MOUTH EVERYDAY AT BEDTIME   . omeprazole (PRILOSEC) 40 MG capsule Take 1 capsule (40 mg total) by mouth daily.   . [DISCONTINUED] atorvastatin (LIPITOR) 20 MG tablet Take 1 tablet (20 mg total) by mouth daily.   Marland Kitchen linaclotide (LINZESS) 290 MCG CAPS capsule Take 1 capsule (290 mcg total) by mouth daily before breakfast.    No facility-administered encounter medications on file as of 03/29/2020.    Surgical History: Past Surgical History:  Procedure Laterality Date  . COLONOSCOPY WITH PROPOFOL N/A 08/14/2018   Procedure: COLONOSCOPY WITH PROPOFOL;  Surgeon: Jonathon Bellows, MD;  Location: [facility] ENDOSCOPY;  Service: Gastroenterology;  Laterality: N/A;  . ESOPHAGOGASTRODUODENOSCOPY (EGD) WITH PROPOFOL N/A 08/14/2018   Procedure: ESOPHAGOGASTRODUODENOSCOPY (EGD) WITH PROPOFOL;  Surgeon: Jonathon Bellows, MD;  Location: [facility] ENDOSCOPY;  Service: Gastroenterology;  Laterality: N/A;  . HEMORRHOID SURGERY      Medical History: Past Medical History:  Diagnosis Date  . Addiction to drug ([place]) 04/06/2015  . ANXIETY 03/11/2008   Qualifier: Diagnosis of  By: Hassell Done FNP, Tori Milks     . Arthritis   . BACK PAIN 02/17/2007   Qualifier: Diagnosis of  By: Silvio Pate MD, Baird Cancer   . Chronic pain   .  Colitis    self  . DEPRESSION 11/05/2003   Qualifier: Diagnosis of  By: Hassell Done FNP, Tori Milks    . GERD 12/29/2008   Qualifier: Diagnosis of  By: Hassell Done FNP, Tori Milks    . H/O arthritis 02/20/2015  . H/O: hypothyroidism 02/20/2015  . HYPERCHOLESTEROLEMIA 03/11/2008   Qualifier: Diagnosis of  By: Hassell Done FNP, Tori Milks    . Hypertension   . PAIN IN JOINT PELVIC REGION AND THIGH 03/11/2008   Qualifier: Diagnosis of  By: Hassell Done FNP, Tori Milks    . Renal disorder   . Sciatica   . Thyroid disease   . TOBACCO ABUSE 07/15/2008   Qualifier: Diagnosis of  By: Hassell Done FNP, Tori Milks      Family  History: Family History  Problem Relation Age of Onset  . Depression Mother   . Hypertension Father   . Heart failure Father   . Diabetes Paternal Grandfather     Social History   Socioeconomic History  . Marital status: Single    Spouse name: Not on file  . Number of children: Not on file  . Years of education: Not on file  . Highest education level: Not on file  Occupational History  . Not on file  Tobacco Use  . Smoking status: Current Every Day Smoker    Packs/day: 0.50    Types: Cigarettes    Start date: 07/05/1990  . Smokeless tobacco: Never Used  Substance and Sexual Activity  . Alcohol use: Yes    Alcohol/week: 0.0 standard drinks    Comment: ocassionally  . Drug use: Yes    Frequency: 2.0 times per week    Types: Marijuana  . Sexual activity: Yes    Birth control/protection: Condom  Other Topics Concern  . Not on file  Social History Narrative  . Not on file   Social Determinants of Health   Financial Resource Strain:   . Difficulty of Paying Living Expenses:   Food Insecurity:   . Worried About Charity fundraiser in the Last Year:   . Arboriculturist in the Last Year:   Transportation Needs:   . Film/video editor (Medical):   Marland Kitchen Lack of Transportation (Non-Medical):   Physical Activity:   . Days of Exercise per Week:   . Minutes of Exercise per Session:   Stress:   . Feeling of Stress :   Social Connections:   . Frequency of Communication with Friends and Family:   . Frequency of Social Gatherings with Friends and Family:   . Attends Religious Services:   . Active Member of Clubs or Organizations:   . Attends Archivist Meetings:   Marland Kitchen Marital Status:   Intimate Partner Violence:   . Fear of Current or Ex-Partner:   . Emotionally Abused:   Marland Kitchen Physically Abused:   . Sexually Abused:       Review of Systems  Constitutional: Negative.  Negative for chills, fatigue and unexpected weight change.  HENT: Negative.  Negative for  congestion, rhinorrhea, sneezing and sore throat.   Eyes: Negative for redness.  Respiratory: Negative.  Negative for cough, chest tightness and shortness of breath.   Cardiovascular: Negative.  Negative for chest pain and palpitations.  Gastrointestinal: Negative.  Negative for abdominal pain, constipation, diarrhea, nausea and vomiting.  Endocrine: Negative.   Genitourinary: Negative.  Negative for dysuria and frequency.  Musculoskeletal: Negative.  Negative for arthralgias, back pain, joint swelling and neck pain.  Skin: Negative.  Negative for rash.  Allergic/Immunologic: Negative.   Neurological: Negative.  Negative for tremors and numbness.  Hematological: Negative for adenopathy. Does not bruise/bleed easily.  Psychiatric/Behavioral: Negative.  Negative for behavioral problems, sleep disturbance and suicidal ideas. The patient is not nervous/anxious.     Vital Signs: BP (!) 141/85   Pulse 74   Temp (!) 97 F (36.1 C)   Resp 16   Ht 6' (1.829 m)   Wt 202 lb (91.6 kg)   SpO2 98%   BMI 27.40 kg/m    Physical Exam Vitals and nursing note reviewed.  Constitutional:      General: He is not in acute distress.    Appearance: He is well-developed. He is not diaphoretic.  HENT:     Head: Normocephalic and atraumatic.     Mouth/Throat:     Pharynx: No oropharyngeal exudate.  Eyes:     Pupils: Pupils are equal, round, and reactive to light.  Neck:     Thyroid: No thyromegaly.     Vascular: No JVD.     Trachea: No tracheal deviation.  Cardiovascular:     Rate and Rhythm: Normal rate and regular rhythm.     Heart sounds: Normal heart sounds. No murmur. No friction rub. No gallop.   Pulmonary:     Effort: Pulmonary effort is normal. No respiratory distress.     Breath sounds: Normal breath sounds. No wheezing or rales.  Chest:     Chest wall: No tenderness.  Abdominal:     Palpations: Abdomen is soft.     Tenderness: There is no abdominal tenderness. There is no guarding.   Musculoskeletal:        General: Normal range of motion.     Cervical back: Normal range of motion and neck supple.  Lymphadenopathy:     Cervical: No cervical adenopathy.  Skin:    General: Skin is warm and dry.  Neurological:     Mental Status: He is alert and oriented to person, place, and time.     Cranial Nerves: No cranial nerve deficit.  Psychiatric:        Behavior: Behavior normal.        Thought Content: Thought content normal.        Judgment: Judgment normal.    Assessment/Plan: 1. Acquired hypothyroidism Recheck Tsh and Free T4, no recent levels. - TSH + free T4  2. Mixed hyperlipidemia Continue Lipitor as directed. - atorvastatin (LIPITOR) 40 MG tablet; Take 1 tablet (40 mg total) by mouth daily.  Dispense: 90 tablet; Refill: 1  3. Essential hypertension, benign Controlled, continue present management.   4. Gastroesophageal reflux disease without esophagitis Continue current treatment  5. Cigarette nicotine dependence with nicotine-induced disorder Smoking cessation counseling: 1. Pt acknowledges the risks of long term smoking, she will try to quite smoking. 2. Options for different medications including nicotine products, chewing gum, patch etc, Wellbutrin and Chantix is discussed 3. Goal and date of compete cessation is discussed 4. Total time spent in smoking cessation is 15 min.   6. Heroin dependence ([place]) Patient remains in recovery, on methadone daily.  General Counseling: Jeremy Delgado understanding of the findings of todays visit and agrees with plan of treatment. I have discussed any further diagnostic evaluation that may be needed or ordered today. We also reviewed his medications today. he has been encouraged to call the office with any questions or concerns that should arise related to todays visit.    Orders Placed This Encounter  Procedures  . TSH +  free T4    Meds ordered this encounter  Medications  . atorvastatin (LIPITOR) 40  MG tablet    Sig: Take 1 tablet (40 mg total) by mouth daily.    Dispense:  90 tablet    Refill:  1    Time spent:30 Minutes   This patient was seen by Orson Gear AGNP-C in Collaboration with Dr Lavera Guise as a part of collaborative care agreement     Kendell Bane AGNP-C Internal medicine

## 2020-04-06 ENCOUNTER — Other Ambulatory Visit: Payer: Self-pay

## 2020-04-06 DIAGNOSIS — F321 Major depressive disorder, single episode, moderate: Secondary | ICD-10-CM

## 2020-04-06 MED ORDER — FLUOXETINE HCL 40 MG PO CAPS: 40.0000 mg | ORAL_CAPSULE | Freq: Every day | ORAL | 1 refills | Status: DC

## 2020-04-25 ENCOUNTER — Other Ambulatory Visit: Payer: Self-pay | Admitting: Adult Health

## 2020-04-25 DIAGNOSIS — E039 Hypothyroidism, unspecified: Secondary | ICD-10-CM

## 2020-04-25 DIAGNOSIS — E782 Mixed hyperlipidemia: Secondary | ICD-10-CM

## 2020-05-07 ENCOUNTER — Other Ambulatory Visit: Payer: Self-pay | Admitting: Adult Health

## 2020-05-07 DIAGNOSIS — M5440 Lumbago with sciatica, unspecified side: Secondary | ICD-10-CM

## 2020-05-09 ENCOUNTER — Other Ambulatory Visit: Payer: Self-pay | Admitting: Internal Medicine

## 2020-05-09 DIAGNOSIS — B369 Superficial mycosis, unspecified: Secondary | ICD-10-CM

## 2020-05-24 ENCOUNTER — Other Ambulatory Visit: Payer: Self-pay | Admitting: Gastroenterology

## 2020-06-26 ENCOUNTER — Other Ambulatory Visit: Payer: Self-pay | Admitting: Adult Health

## 2020-06-26 ENCOUNTER — Telehealth: Payer: Self-pay

## 2020-06-26 DIAGNOSIS — G8929 Other chronic pain: Secondary | ICD-10-CM

## 2020-06-26 NOTE — Telephone Encounter (Signed)
Confirmed and screened office visit on 8/25

## 2020-06-28 ENCOUNTER — Encounter: Payer: Self-pay | Admitting: Hospice and Palliative Medicine

## 2020-06-28 ENCOUNTER — Ambulatory Visit (INDEPENDENT_AMBULATORY_CARE_PROVIDER_SITE_OTHER): Payer: Medicare Other | Admitting: Hospice and Palliative Medicine

## 2020-06-28 ENCOUNTER — Other Ambulatory Visit: Payer: Self-pay

## 2020-06-28 DIAGNOSIS — M5440 Lumbago with sciatica, unspecified side: Secondary | ICD-10-CM

## 2020-06-28 DIAGNOSIS — R29898 Other symptoms and signs involving the musculoskeletal system: Secondary | ICD-10-CM | POA: Diagnosis not present

## 2020-06-28 DIAGNOSIS — G8929 Other chronic pain: Secondary | ICD-10-CM | POA: Diagnosis not present

## 2020-06-28 DIAGNOSIS — F17219 Nicotine dependence, cigarettes, with unspecified nicotine-induced disorders: Secondary | ICD-10-CM | POA: Diagnosis not present

## 2020-06-28 DIAGNOSIS — E785 Hyperlipidemia, unspecified: Secondary | ICD-10-CM

## 2020-06-28 DIAGNOSIS — E783 Hyperchylomicronemia: Secondary | ICD-10-CM

## 2020-06-28 DIAGNOSIS — I7090 Unspecified atherosclerosis: Secondary | ICD-10-CM

## 2020-06-28 DIAGNOSIS — E1169 Type 2 diabetes mellitus with other specified complication: Secondary | ICD-10-CM

## 2020-06-28 DIAGNOSIS — E039 Hypothyroidism, unspecified: Secondary | ICD-10-CM

## 2020-06-28 MED ORDER — GABAPENTIN 600 MG PO TABS: 600.0000 mg | ORAL_TABLET | Freq: Two times a day (BID) | ORAL | 2 refills | Status: DC

## 2020-06-28 MED ORDER — LEVOTHYROXINE SODIUM 88 MCG PO TABS: ORAL_TABLET | ORAL | 0 refills | Status: DC

## 2020-06-28 MED ORDER — MELOXICAM 15 MG PO TABS: 15.0000 mg | ORAL_TABLET | Freq: Every day | ORAL | 1 refills | Status: DC

## 2020-06-28 NOTE — Progress Notes (Signed)
[facility] [place], [place] [address]  Internal MEDICINE  Office Visit Note  Patient Name: Jeremy Delgado  [id number]  [id number]  Date of Service: 06/28/2020  Chief Complaint  Patient presents with  . Follow-up    pt thinks he broke ribs on the right side, refill request meloxicam  . Hypertension  . Hyperlipidemia  . Quality Metric Gaps    HepC, HIV screening, TDAP    HPI Patient is here for routine follow-up. A few days ago he had an accident and fell on his right rib cage and has been dealing with intense pain since.  Today he rates his pain a 10 out of 10 and says the pain is so bad it prevents him from taking a deep breath. He says at his last visit he was given meloxicam and since his accident he has been taking this and it is helped relieve his pain.  He is requesting refills of this medication today.  Current Medication: Outpatient Encounter Medications as of 06/28/2020  Medication Sig Note  . amLODipine (NORVASC) 2.5 MG tablet Take 1 tablet (2.5 mg total) by mouth daily.   Marland Kitchen aspirin EC 81 MG tablet Take by mouth.   Marland Kitchen atorvastatin (LIPITOR) 40 MG tablet Take 1 tablet (40 mg total) by mouth daily.   . cyclobenzaprine (FLEXERIL) 10 MG tablet TAKE 1 TABLET (10 MG TOTAL) BY MOUTH 2 (TWO) TIMES DAILY AS NEEDED (PAIN)   . FLUoxetine (PROZAC) 40 MG capsule Take 1 capsule (40 mg total) by mouth daily.   Marland Kitchen gabapentin (NEURONTIN) 600 MG tablet Take 1 tablet (600 mg total) by mouth 2 (two) times daily.   Marland Kitchen levothyroxine (SYNTHROID) 88 MCG tablet Take as directed   . LINZESS 290 MCG CAPS capsule TAKE 1 CAPSULE (290 MCG TOTAL) BY MOUTH DAILY BEFORE BREAKFAST.   . Melatonin 3 MG CAPS Take 1 capsule by mouth at bedtime as needed (sleep).  09/27/2015: .   . meloxicam (MOBIC) 15 MG tablet Take 1 tablet (15 mg total) by mouth daily.   . mirtazapine (REMERON) 15 MG tablet TAKE 1 TABLET BY MOUTH EVERYDAY AT BEDTIME   . omeprazole (PRILOSEC) 40 MG capsule Take 1  capsule (40 mg total) by mouth daily.   . [DISCONTINUED] gabapentin (NEURONTIN) 600 MG tablet TAKE 1 TABLET BY MOUTH TWICE A DAY   . [DISCONTINUED] levothyroxine (SYNTHROID) 88 MCG tablet TAKE 1 TABLET BY MOUTH EVERY DAY IN THE MORNING   . [DISCONTINUED] meloxicam (MOBIC) 15 MG tablet TAKE 1 TABLET BY MOUTH EVERY DAY   . atorvastatin (LIPITOR) 20 MG tablet TAKE 1 TABLET BY MOUTH EVERY DAY (Patient not taking: Reported on 06/28/2020)   . clotrimazole-betamethasone (LOTRISONE) cream APPLY TO AFFECTED AREA TWICE A DAY (Patient not taking: Reported on 06/28/2020)   . Diphenhyd-Hydrocort-Nystatin (FIRST-DUKES MOUTHWASH) SUSP Swish and spit 30mls three times a day (Patient not taking: Reported on 06/28/2020)    No facility-administered encounter medications on file as of 06/28/2020.    Surgical History: Past Surgical History:  Procedure Laterality Date  . COLONOSCOPY WITH PROPOFOL N/A 08/14/2018   Procedure: COLONOSCOPY WITH PROPOFOL;  Surgeon: Jonathon Bellows, MD;  Location: [facility] ENDOSCOPY;  Service: Gastroenterology;  Laterality: N/A;  . ESOPHAGOGASTRODUODENOSCOPY (EGD) WITH PROPOFOL N/A 08/14/2018   Procedure: ESOPHAGOGASTRODUODENOSCOPY (EGD) WITH PROPOFOL;  Surgeon: Jonathon Bellows, MD;  Location: [facility] ENDOSCOPY;  Service: Gastroenterology;  Laterality: N/A;  . HEMORRHOID SURGERY      Medical History: Past Medical History:  Diagnosis Date  .  Addiction to drug ([place]) 04/06/2015  . ANXIETY 03/11/2008   Qualifier: Diagnosis of  By: Hassell Done FNP, Tori Milks     . Arthritis   . BACK PAIN 02/17/2007   Qualifier: Diagnosis of  By: Silvio Pate MD, [facility]   . Chronic pain   . Colitis    self  . DEPRESSION 11/05/2003   Qualifier: Diagnosis of  By: Hassell Done FNP, Tori Milks    . GERD 12/29/2008   Qualifier: Diagnosis of  By: Hassell Done FNP, Tori Milks    . H/O arthritis 02/20/2015  . H/O: hypothyroidism 02/20/2015  . HYPERCHOLESTEROLEMIA 03/11/2008   Qualifier: Diagnosis of  By: Hassell Done FNP, Tori Milks    . Hypertension   . PAIN  IN JOINT PELVIC REGION AND THIGH 03/11/2008   Qualifier: Diagnosis of  By: Hassell Done FNP, Tori Milks    . Renal disorder   . Sciatica   . Thyroid disease   . TOBACCO ABUSE 07/15/2008   Qualifier: Diagnosis of  By: Hassell Done FNP, Tori Milks      Family History: Family History  Problem Relation Age of Onset  . Depression Mother   . Hypertension Father   . Heart failure Father   . Diabetes Paternal Grandfather     Social History   Socioeconomic History  . Marital status: Single    Spouse name: Not on file  . Number of children: Not on file  . Years of education: Not on file  . Highest education level: Not on file  Occupational History  . Not on file  Tobacco Use  . Smoking status: Current Every Day Smoker    Packs/day: 0.50    Types: Cigarettes    Start date: 07/05/1990  . Smokeless tobacco: Former Network engineer  . Vaping Use: Every day  Substance and Sexual Activity  . Alcohol use: Yes    Alcohol/week: 0.0 standard drinks    Comment: ocassionally  . Drug use: Yes    Frequency: 2.0 times per week    Types: Marijuana  . Sexual activity: Yes    Birth control/protection: Condom  Other Topics Concern  . Not on file  Social History Narrative  . Not on file   Social Determinants of Health   Financial Resource Strain:   . Difficulty of Paying Living Expenses: Not on file  Food Insecurity:   . Worried About Charity fundraiser in the Last Year: Not on file  . Ran Out of Food in the Last Year: Not on file  Transportation Needs:   . Lack of Transportation (Medical): Not on file  . Lack of Transportation (Non-Medical): Not on file  Physical Activity:   . Days of Exercise per Week: Not on file  . Minutes of Exercise per Session: Not on file  Stress:   . Feeling of Stress : Not on file  Social Connections:   . Frequency of Communication with Friends and Family: Not on file  . Frequency of Social Gatherings with Friends and Family: Not on file  . Attends Religious Services: Not  on file  . Active Member of Clubs or Organizations: Not on file  . Attends Archivist Meetings: Not on file  . Marital Status: Not on file  Intimate Partner Violence:   . Fear of Current or Ex-Partner: Not on file  . Emotionally Abused: Not on file  . Physically Abused: Not on file  . Sexually Abused: Not on file   Review of Systems  Constitutional: Negative for chills, diaphoresis, fatigue and fever.  HENT: Negative for congestion, rhinorrhea, sinus pressure, sinus pain and sore throat.   Eyes: Negative for photophobia and visual disturbance.  Respiratory: Negative for cough, chest tightness and shortness of breath.        Right sided rib pain from injury, prevents him from taking deep breaths  Cardiovascular: Negative for chest pain, palpitations and leg swelling.  Gastrointestinal: Negative for abdominal pain, constipation, diarrhea, nausea and vomiting.  Genitourinary: Negative for dysuria and frequency.  Musculoskeletal: Negative for arthralgias, back pain, joint swelling and neck pain.  Skin: Negative for rash.  Neurological: Negative for dizziness, tremors, numbness and headaches.  Hematological: Negative for adenopathy. Does not bruise/bleed easily.  Psychiatric/Behavioral: Negative for behavioral problems (Depression), sleep disturbance and suicidal ideas. The patient is not nervous/anxious.     Vital Signs: BP 119/73   Pulse 68   Temp 98.1 F (36.7 C)   Resp 16   Ht 6\' 1"  (1.854 m)   Wt 206 lb 12.8 oz (93.8 kg)   SpO2 96%   BMI 27.28 kg/m    Physical Exam Constitutional:      Appearance: He is ill-appearing.  HENT:     Mouth/Throat:     Mouth: Mucous membranes are moist.     Pharynx: Oropharynx is clear.  Cardiovascular:     Rate and Rhythm: Normal rate and regular rhythm.     Pulses: Normal pulses.     Heart sounds: Normal heart sounds.  Pulmonary:     Effort: Pulmonary effort is normal.     Breath sounds: Normal breath sounds.  Abdominal:      General: Abdomen is flat.     Palpations: Abdomen is soft.  Musculoskeletal:     Cervical back: Normal range of motion.     Comments: Guarding right side ribs, grimacing at times  Skin:    General: Skin is warm.  Neurological:     General: No focal deficit present.     Mental Status: He is alert and oriented to person, place, and time. Mental status is at baseline.  Psychiatric:        Mood and Affect: Mood normal.        Behavior: Behavior normal.        Thought Content: Thought content normal.    Assessment/Plan: 1. Suspected closed fracture of rib of right side Reports injury to right side of rib cage from fall.  Difficulty taking deep breaths.  Will assess for fracture of ribs on chest x-ray.  Pain management treatment is limited due to patient being seen by pain clinic.  Advised to use heating pad as well as over-the-counter acetaminophen and meloxicam as needed. - DG Chest 2 View; Future - meloxicam (MOBIC) 15 MG tablet; Take 1 tablet (15 mg total) by mouth daily.  Dispense: 30 tablet; Refill: 1  2. Cigarette nicotine dependence with nicotine-induced disorder Continues to be every day smoker.  Discussed with him the negative effects smoking has on his overall health.  At this time he is not interested in smoking cessation. Smoking cessation counseling: 1. Pt acknowledges the risks of long term smoking, she will try to quite smoking. 2. Options for different medications including nicotine products, chewing gum, patch etc, Wellbutrin and Chantix is discussed 3. Goal and date of compete cessation is discussed 4. Total time spent in smoking cessation is 15 min.   3. Acquired hypothyroidism Levothyroxine dose lowered at last visit. Repeat thyroid panel has not been collected since change in dose. Will repeat levels to  assess for stability. Lab slip given to have completed. - levothyroxine (SYNTHROID) 88 MCG tablet; Take as directed  Dispense: 90 tablet; Refill: 0  4. Chronic  bilateral low back pain with sciatica, sciatica laterality unspecified Gabapentin relieves sciatica pain, continue with this therapy and continue to monitor effectiveness. - gabapentin (NEURONTIN) 600 MG tablet; Take 1 tablet (600 mg total) by mouth 2 (two) times daily.  Dispense: 60 tablet; Refill: 2  5. Atherosclerosis Will monitor for progression or stabilization of disease.  - US Carotid Duplex Bilateral; Future  General Counseling: claire bridge understanding of the findings of todays visit and agrees with plan of treatment. I have discussed any further diagnostic evaluation that may be needed or ordered today. We also reviewed his medications today. he has been encouraged to call the office with any questions or concerns that should arise related to todays visit.    Orders Placed This Encounter  Procedures  . DG Chest 2 View  . US Carotid Duplex Bilateral    Meds ordered this encounter  Medications  . gabapentin (NEURONTIN) 600 MG tablet    Sig: Take 1 tablet (600 mg total) by mouth 2 (two) times daily.    Dispense:  60 tablet    Refill:  2    DX Code Needed  NEEDS REFILS.  Marland Kitchen levothyroxine (SYNTHROID) 88 MCG tablet    Sig: Take as directed    Dispense:  90 tablet    Refill:  0  . meloxicam (MOBIC) 15 MG tablet    Sig: Take 1 tablet (15 mg total) by mouth daily.    Dispense:  30 tablet    Refill:  1    Time spent: 30 Minutes   This patient was seen by Theodoro Grist AGNP-C in Collaboration with Dr Lavera Guise as a part of collaborative care agreement     Tanna Furry. Dorthy Magnussen AGNP-C Internal medicine

## 2020-08-11 ENCOUNTER — Ambulatory Visit: Payer: Medicare Other

## 2020-08-11 DIAGNOSIS — I6523 Occlusion and stenosis of bilateral carotid arteries: Secondary | ICD-10-CM

## 2020-08-11 DIAGNOSIS — I7090 Unspecified atherosclerosis: Secondary | ICD-10-CM

## 2020-08-17 ENCOUNTER — Telehealth: Payer: Self-pay

## 2020-08-17 NOTE — Telephone Encounter (Signed)
Medication list faxed to [facility] at [phone].

## 2020-08-18 NOTE — Procedures (Signed)
[place], [place] [address]  DATE OF SERVICE: August 11, 2020  CAROTID DOPPLER INTERPRETATION:  Bilateral Carotid Ultrsasound and Color Doppler Examination was performed. The RIGHT CCA shows mild plaque in the vessel. The LEFT CCA shows mild plaque in the vessel. There was no significant intimal thickening noted in the RIGHT carotid artery. There was no significant intimal thickening in the LEFT carotid artery.  The RIGHT CCA shows peak systolic velocity of 706 cm per second. The end diastolic velocity is 23JS per second on the RIGHT side. The RIGHT ICA shows peak systolic velocity of 85 per second. RIGHT sided ICA end diastolic velocity is 37 cm per second. The RIGHT ECA shows a peak systolic velocity of 97 cm per second. The ICA/CCA ratio is calculated to be 0.72. This suggests less than 50% stenosis. The Vertebral Artery shows antegrade flow.  The LEFT CCA shows peak systolic velocity of 283 cm per second. The end diastolic velocity is 39 cm per second on the LEFT side. The LEFT ICA shows peak systolic velocity of 76 per second. LEFT sided ICA end diastolic velocity is 23 cm per second. The LEFT ECA shows a peak systolic velocity of 92 cm per second. The ICA/CCA ratio is calculated to be 0.63. This suggests less than 50% stenosis. The Vertebral Artery shows antegrade flow.   Impression:    The RIGHT CAROTID shows less than 50% stenosis. The LEFT CAROTID shows less than 50% stenosis.  There is mild plaque formation noted on the LEFT and mild plaque on the RIGHT  side. Consider a repeat Carotid doppler if clinical situation and symptoms warrant in 6-12 months. Patient should be encouraged to change lifestyles such as smoking cessation, regular exercise and dietary modification. Use of statins in the right clinical setting and ASA is encouraged.  Allyne Gee, MD [facility] Pulmonary Critical Care Medicine

## 2020-08-22 ENCOUNTER — Telehealth: Payer: Self-pay

## 2020-08-22 NOTE — Telephone Encounter (Signed)
Faxed requested medication list to [facility] at [phone].

## 2020-09-19 ENCOUNTER — Other Ambulatory Visit: Payer: Self-pay | Admitting: Internal Medicine

## 2020-09-19 DIAGNOSIS — E039 Hypothyroidism, unspecified: Secondary | ICD-10-CM

## 2020-10-03 ENCOUNTER — Ambulatory Visit: Payer: Medicare Other | Admitting: Hospice and Palliative Medicine

## 2020-10-03 ENCOUNTER — Encounter: Payer: Self-pay | Admitting: Hospice and Palliative Medicine

## 2020-10-03 ENCOUNTER — Other Ambulatory Visit: Payer: Self-pay

## 2020-10-03 VITALS — BP 122/68 | HR 65 | Temp 98.0°F | Resp 16 | Ht 72.0 in | Wt 220.4 lb

## 2020-10-03 DIAGNOSIS — E782 Mixed hyperlipidemia: Secondary | ICD-10-CM

## 2020-10-03 DIAGNOSIS — F321 Major depressive disorder, single episode, moderate: Secondary | ICD-10-CM

## 2020-10-03 DIAGNOSIS — K219 Gastro-esophageal reflux disease without esophagitis: Secondary | ICD-10-CM

## 2020-10-03 DIAGNOSIS — G8929 Other chronic pain: Secondary | ICD-10-CM | POA: Diagnosis not present

## 2020-10-03 DIAGNOSIS — R600 Localized edema: Secondary | ICD-10-CM | POA: Diagnosis not present

## 2020-10-03 DIAGNOSIS — E039 Hypothyroidism, unspecified: Secondary | ICD-10-CM

## 2020-10-03 DIAGNOSIS — I10 Essential (primary) hypertension: Secondary | ICD-10-CM

## 2020-10-03 MED ORDER — GABAPENTIN 600 MG PO TABS: 600.0000 mg | ORAL_TABLET | Freq: Two times a day (BID) | ORAL | 2 refills | Status: DC

## 2020-10-03 MED ORDER — AMLODIPINE BESYLATE 2.5 MG PO TABS: 2.5000 mg | ORAL_TABLET | Freq: Every day | ORAL | 2 refills | Status: DC

## 2020-10-03 MED ORDER — OMEPRAZOLE 40 MG PO CPDR: 40.0000 mg | DELAYED_RELEASE_CAPSULE | Freq: Every day | ORAL | 1 refills | Status: DC

## 2020-10-03 MED ORDER — HYDROCHLOROTHIAZIDE 12.5 MG PO TABS: 12.5000 mg | ORAL_TABLET | Freq: Every day | ORAL | 0 refills | Status: DC

## 2020-10-03 MED ORDER — ATORVASTATIN CALCIUM 40 MG PO TABS: 40.0000 mg | ORAL_TABLET | Freq: Every day | ORAL | 1 refills | Status: DC

## 2020-10-03 MED ORDER — FLUOXETINE HCL 40 MG PO CAPS: 40.0000 mg | ORAL_CAPSULE | Freq: Every day | ORAL | 1 refills | Status: DC

## 2020-10-03 NOTE — Progress Notes (Signed)
[facility] [place], [place] [address]  Internal MEDICINE  Office Visit Note  Patient Name: Jeremy Delgado  [id number]  [id number]  Date of Service: 10/08/2020  Chief Complaint  Patient presents with  . Follow-up    3 month review [place] and xray  . Hypertension    pt states when stressed he has checked bp around 140/101  . controlled substance policy    scanned  . Quality Metric Gaps    Hep C screen, HIV screen, urine microalbumin, Tdap    HPI Patient is here for routine follow-up Rib injury evaluated at last visit--resolved, no longer experiencing pain Hx of hypothyroidism, BP and depression Concerned today about BP--over the last several months he has noticed episodes of feeling off--"swimmy" headed, he begins feeling this way after getting stressed or anxious, when he experiences this he will check his BP and it is elevated Also concerned today of bilateral lower extremity, ankle edema--has been ongoing for several weeks, mentions edema worsens at the end of the day, is improved with elevation Depression--he feels his symptoms remain well controlled at this time   Current Medication: Outpatient Encounter Medications as of 10/03/2020  Medication Sig Note  . amLODipine (NORVASC) 2.5 MG tablet Take 1 tablet (2.5 mg total) by mouth daily.   Marland Kitchen atorvastatin (LIPITOR) 40 MG tablet Take 1 tablet (40 mg total) by mouth daily.   . clotrimazole-betamethasone (LOTRISONE) cream APPLY TO AFFECTED AREA TWICE A DAY   . cyclobenzaprine (FLEXERIL) 10 MG tablet TAKE 1 TABLET (10 MG TOTAL) BY MOUTH 2 (TWO) TIMES DAILY AS NEEDED (PAIN)   . FLUoxetine (PROZAC) 40 MG capsule Take 1 capsule (40 mg total) by mouth daily.   Marland Kitchen gabapentin (NEURONTIN) 600 MG tablet Take 1 tablet (600 mg total) by mouth 2 (two) times daily.   Marland Kitchen levothyroxine (SYNTHROID) 88 MCG tablet TAKE 1 TABLET BY MOUTH EVERY DAY IN THE MORNING   . LINZESS 290 MCG CAPS capsule TAKE 1 CAPSULE (290 MCG TOTAL) BY  MOUTH DAILY BEFORE BREAKFAST.   . Melatonin 3 MG CAPS Take 1 capsule by mouth at bedtime as needed (sleep).  09/27/2015: .   . meloxicam (MOBIC) 15 MG tablet Take 1 tablet (15 mg total) by mouth daily.   . mirtazapine (REMERON) 15 MG tablet TAKE 1 TABLET BY MOUTH EVERYDAY AT BEDTIME   . omeprazole (PRILOSEC) 40 MG capsule Take 1 capsule (40 mg total) by mouth daily.   . [DISCONTINUED] amLODipine (NORVASC) 2.5 MG tablet Take 1 tablet (2.5 mg total) by mouth daily.   . [DISCONTINUED] aspirin EC 81 MG tablet Take by mouth.   . [DISCONTINUED] atorvastatin (LIPITOR) 20 MG tablet TAKE 1 TABLET BY MOUTH EVERY DAY   . [DISCONTINUED] atorvastatin (LIPITOR) 40 MG tablet Take 1 tablet (40 mg total) by mouth daily.   . [DISCONTINUED] Diphenhyd-Hydrocort-Nystatin (FIRST-DUKES MOUTHWASH) SUSP Swish and spit 59mls three times a day   . [DISCONTINUED] FLUoxetine (PROZAC) 40 MG capsule Take 1 capsule (40 mg total) by mouth daily.   . [DISCONTINUED] gabapentin (NEURONTIN) 600 MG tablet Take 1 tablet (600 mg total) by mouth 2 (two) times daily.   . [DISCONTINUED] omeprazole (PRILOSEC) 40 MG capsule Take 1 capsule (40 mg total) by mouth daily.   . hydrochlorothiazide (HYDRODIURIL) 12.5 MG tablet Take 1 tablet (12.5 mg total) by mouth daily.    No facility-administered encounter medications on file as of 10/03/2020.    Surgical History: Past Surgical History:  Procedure Laterality Date  .  COLONOSCOPY WITH PROPOFOL N/A 08/14/2018   Procedure: COLONOSCOPY WITH PROPOFOL;  Surgeon: Jonathon Bellows, MD;  Location: [facility] ENDOSCOPY;  Service: Gastroenterology;  Laterality: N/A;  . ESOPHAGOGASTRODUODENOSCOPY (EGD) WITH PROPOFOL N/A 08/14/2018   Procedure: ESOPHAGOGASTRODUODENOSCOPY (EGD) WITH PROPOFOL;  Surgeon: Jonathon Bellows, MD;  Location: [facility] ENDOSCOPY;  Service: Gastroenterology;  Laterality: N/A;  . HEMORRHOID SURGERY      Medical History: Past Medical History:  Diagnosis Date  . Addiction to drug ([place]) 04/06/2015  .  ANXIETY 03/11/2008   Qualifier: Diagnosis of  By: Hassell Done FNP, Tori Milks     . Arthritis   . BACK PAIN 02/17/2007   Qualifier: Diagnosis of  By: Silvio Pate MD, Baird Cancer   . Chronic pain   . Colitis    self  . DEPRESSION 11/05/2003   Qualifier: Diagnosis of  By: Hassell Done FNP, Tori Milks    . GERD 12/29/2008   Qualifier: Diagnosis of  By: Hassell Done FNP, Tori Milks    . H/O arthritis 02/20/2015  . H/O: hypothyroidism 02/20/2015  . HYPERCHOLESTEROLEMIA 03/11/2008   Qualifier: Diagnosis of  By: Hassell Done FNP, Tori Milks    . Hypertension   . PAIN IN JOINT PELVIC REGION AND THIGH 03/11/2008   Qualifier: Diagnosis of  By: Hassell Done FNP, Tori Milks    . Renal disorder   . Sciatica   . Thyroid disease   . TOBACCO ABUSE 07/15/2008   Qualifier: Diagnosis of  By: Hassell Done FNP, Tori Milks      Family History: Family History  Problem Relation Age of Onset  . Depression Mother   . Hypertension Father   . Heart failure Father   . Diabetes Paternal Grandfather     Social History   Socioeconomic History  . Marital status: Single    Spouse name: Not on file  . Number of children: Not on file  . Years of education: Not on file  . Highest education level: Not on file  Occupational History  . Not on file  Tobacco Use  . Smoking status: Current Every Day Smoker    Packs/day: 0.50    Types: Cigarettes    Start date: 07/05/1990  . Smokeless tobacco: Former Network engineer  . Vaping Use: Every day  Substance and Sexual Activity  . Alcohol use: Yes    Alcohol/week: 0.0 standard drinks    Comment: ocassionally  . Drug use: Yes    Frequency: 2.0 times per week    Types: Marijuana  . Sexual activity: Yes    Birth control/protection: Condom  Other Topics Concern  . Not on file  Social History Narrative  . Not on file   Social Determinants of Health   Financial Resource Strain:   . Difficulty of Paying Living Expenses: Not on file  Food Insecurity:   . Worried About Charity fundraiser in the Last Year: Not on file   . Ran Out of Food in the Last Year: Not on file  Transportation Needs:   . Lack of Transportation (Medical): Not on file  . Lack of Transportation (Non-Medical): Not on file  Physical Activity:   . Days of Exercise per Week: Not on file  . Minutes of Exercise per Session: Not on file  Stress:   . Feeling of Stress : Not on file  Social Connections:   . Frequency of Communication with Friends and Family: Not on file  . Frequency of Social Gatherings with Friends and Family: Not on file  . Attends Religious Services: Not on file  . Active Member  of Clubs or Organizations: Not on file  . Attends Archivist Meetings: Not on file  . Marital Status: Not on file  Intimate Partner Violence:   . Fear of Current or Ex-Partner: Not on file  . Emotionally Abused: Not on file  . Physically Abused: Not on file  . Sexually Abused: Not on file      Review of Systems  Constitutional: Negative for chills, fatigue and unexpected weight change.  HENT: Negative for congestion, postnasal drip, rhinorrhea, sneezing and sore throat.   Eyes: Negative for redness.  Respiratory: Negative for cough, chest tightness and shortness of breath.   Cardiovascular: Positive for leg swelling. Negative for chest pain and palpitations.  Gastrointestinal: Negative for abdominal pain, constipation, diarrhea, nausea and vomiting.  Genitourinary: Negative for dysuria and frequency.  Musculoskeletal: Negative for arthralgias, back pain, joint swelling and neck pain.  Skin: Negative for rash.  Neurological: Positive for light-headedness. Negative for tremors and numbness.  Hematological: Negative for adenopathy. Does not bruise/bleed easily.  Psychiatric/Behavioral: Negative for behavioral problems (Depression), sleep disturbance and suicidal ideas. The patient is not nervous/anxious.     Vital Signs: BP 122/68   Pulse 65   Temp 98 F (36.7 C)   Resp 16   Ht 6' (1.829 m)   Wt 220 lb 6.4 oz (100 kg)    SpO2 95%   BMI 29.89 kg/m    Physical Exam Vitals reviewed.  Constitutional:      Appearance: Normal appearance. He is normal weight.  Cardiovascular:     Rate and Rhythm: Normal rate and regular rhythm.     Pulses: Normal pulses.     Heart sounds: Normal heart sounds.  Pulmonary:     Effort: Pulmonary effort is normal.     Breath sounds: Normal breath sounds.  Musculoskeletal:        General: Normal range of motion.     Cervical back: Normal range of motion.     Right lower leg: 1+ Edema present.     Left lower leg: 1+ Edema present.  Skin:    General: Skin is warm and dry.  Neurological:     General: No focal deficit present.     Mental Status: He is alert and oriented to person, place, and time. Mental status is at baseline.  Psychiatric:        Mood and Affect: Mood normal.        Behavior: Behavior normal.        Thought Content: Thought content normal.        Judgment: Judgment normal.    Assessment/Plan: 1. Bilateral lower extremity edema Will review results of echocardiogram for underlying cardiac etiology potentially causing ankle edema - ECHOCARDIOGRAM COMPLETE; Future  2. Other chronic pain Stable, requesting refills - gabapentin (NEURONTIN) 600 MG tablet; Take 1 tablet (600 mg total) by mouth 2 (two) times daily.  Dispense: 60 tablet; Refill: 2  3. Essential hypertension, benign Will add low dose HCTZ due to ankle edema as well as intermittent episodes Echocardiogram due to intermittent lightheadedness - hydrochlorothiazide (HYDRODIURIL) 12.5 MG tablet; Take 1 tablet (12.5 mg total) by mouth daily.  Dispense: 90 tablet; Refill: 0 - ECHOCARDIOGRAM COMPLETE; Future - amLODipine (NORVASC) 2.5 MG tablet; Take 1 tablet (2.5 mg total) by mouth daily.  Dispense: 90 tablet; Refill: 2  4. Mixed hyperlipidemia Stable, requesting refills - atorvastatin (LIPITOR) 40 MG tablet; Take 1 tablet (40 mg total) by mouth daily.  Dispense: 90 tablet; Refill: 1  5. Current  moderate episode of major depressive disorder, unspecified whether recurrent (HCC) Stable, requesting refills - FLUoxetine (PROZAC) 40 MG capsule; Take 1 capsule (40 mg total) by mouth daily.  Dispense: 90 capsule; Refill: 1  6. Gastroesophageal reflux disease without esophagitis Stable, requesting refills - omeprazole (PRILOSEC) 40 MG capsule; Take 1 capsule (40 mg total) by mouth daily.  Dispense: 90 capsule; Refill: 1  7. Acquired hypothyroidism Repeat thyroid levels, synthroid dose increased in March due to elevated TSH - TSH + free T4  General Counseling: Sava verbalizes understanding of the findings of todays visit and agrees with plan of treatment. I have discussed any further diagnostic evaluation that may be needed or ordered today. We also reviewed his medications today. he has been encouraged to call the office with any questions or concerns that should arise related to todays visit.    Orders Placed This Encounter  Procedures  . TSH + free T4  . ECHOCARDIOGRAM COMPLETE    Meds ordered this encounter  Medications  . hydrochlorothiazide (HYDRODIURIL) 12.5 MG tablet    Sig: Take 1 tablet (12.5 mg total) by mouth daily.    Dispense:  90 tablet    Refill:  0  . gabapentin (NEURONTIN) 600 MG tablet    Sig: Take 1 tablet (600 mg total) by mouth 2 (two) times daily.    Dispense:  60 tablet    Refill:  2    DX Code Needed  NEEDS REFILS.  Marland Kitchen amLODipine (NORVASC) 2.5 MG tablet    Sig: Take 1 tablet (2.5 mg total) by mouth daily.    Dispense:  90 tablet    Refill:  2  . atorvastatin (LIPITOR) 40 MG tablet    Sig: Take 1 tablet (40 mg total) by mouth daily.    Dispense:  90 tablet    Refill:  1  . FLUoxetine (PROZAC) 40 MG capsule    Sig: Take 1 capsule (40 mg total) by mouth daily.    Dispense:  90 capsule    Refill:  1  . omeprazole (PRILOSEC) 40 MG capsule    Sig: Take 1 capsule (40 mg total) by mouth daily.    Dispense:  90 capsule    Refill:  1    Time spent:  30 Minutes Time spent includes review of chart, medications, test results and follow-up plan with the patient.  This patient was seen by Theodoro Grist AGNP-C in Collaboration with Dr Lavera Guise as a part of collaborative care agreement     Tanna Furry. Darielle Hancher AGNP-C Internal medicine

## 2020-10-08 ENCOUNTER — Encounter: Payer: Self-pay | Admitting: Hospice and Palliative Medicine

## 2020-10-08 ENCOUNTER — Telehealth: Payer: Self-pay

## 2020-10-08 NOTE — Telephone Encounter (Signed)
Called pt and informed him to go to [facility] and have thyroid levels drawn advised he doesn't have to be fasting.  Pt advised he will have them done next week.

## 2020-10-08 NOTE — Telephone Encounter (Signed)
-----   Message from Luiz Ochoa, NP sent at 10/08/2020  5:18 PM EST ----- Hey, can you call him and ask him to go and have his thyroid levels drawn at [facility]--he can go anytime, does not need to be fasting.

## 2020-10-13 ENCOUNTER — Ambulatory Visit: Payer: Medicare Other

## 2020-10-13 DIAGNOSIS — R600 Localized edema: Secondary | ICD-10-CM

## 2020-10-13 DIAGNOSIS — I10 Essential (primary) hypertension: Secondary | ICD-10-CM

## 2020-10-13 DIAGNOSIS — E039 Hypothyroidism, unspecified: Secondary | ICD-10-CM | POA: Diagnosis not present

## 2020-10-13 DIAGNOSIS — R609 Edema, unspecified: Secondary | ICD-10-CM

## 2020-10-14 LAB — TSH+FREE T4
Free T4: 1.29 ng/dL (ref 0.82–1.77)
TSH: 3.11 u[IU]/mL (ref 0.450–4.500)

## 2020-10-18 NOTE — Progress Notes (Signed)
Reviewed, will discuss

## 2020-10-20 ENCOUNTER — Other Ambulatory Visit: Payer: Self-pay | Admitting: Adult Health

## 2020-10-20 DIAGNOSIS — G8929 Other chronic pain: Secondary | ICD-10-CM

## 2020-11-06 ENCOUNTER — Ambulatory Visit: Payer: Medicare Other | Admitting: Nurse Practitioner

## 2020-11-09 ENCOUNTER — Other Ambulatory Visit: Payer: Self-pay

## 2020-11-09 ENCOUNTER — Ambulatory Visit: Payer: Medicare Other | Admitting: Nurse Practitioner

## 2020-11-14 ENCOUNTER — Other Ambulatory Visit: Payer: Self-pay | Admitting: Adult Health

## 2020-11-14 DIAGNOSIS — F5102 Adjustment insomnia: Secondary | ICD-10-CM

## 2020-12-12 ENCOUNTER — Other Ambulatory Visit: Payer: Self-pay | Admitting: Hospice and Palliative Medicine

## 2020-12-12 DIAGNOSIS — I10 Essential (primary) hypertension: Secondary | ICD-10-CM

## 2020-12-19 ENCOUNTER — Other Ambulatory Visit: Payer: Self-pay | Admitting: Hospice and Palliative Medicine

## 2020-12-19 DIAGNOSIS — E039 Hypothyroidism, unspecified: Secondary | ICD-10-CM

## 2021-01-06 ENCOUNTER — Other Ambulatory Visit: Payer: Self-pay | Admitting: Hospice and Palliative Medicine

## 2021-01-06 DIAGNOSIS — G8929 Other chronic pain: Secondary | ICD-10-CM

## 2021-01-22 ENCOUNTER — Other Ambulatory Visit: Payer: Self-pay

## 2021-01-22 ENCOUNTER — Other Ambulatory Visit: Payer: Self-pay | Admitting: Hospice and Palliative Medicine

## 2021-01-22 ENCOUNTER — Ambulatory Visit: Payer: Medicare Other | Admitting: Physician Assistant

## 2021-02-05 ENCOUNTER — Other Ambulatory Visit: Payer: Self-pay

## 2021-02-05 ENCOUNTER — Encounter: Payer: Self-pay | Admitting: Physician Assistant

## 2021-02-05 ENCOUNTER — Ambulatory Visit: Payer: Medicare Other | Admitting: Physician Assistant

## 2021-02-05 DIAGNOSIS — Z789 Other specified health status: Secondary | ICD-10-CM | POA: Diagnosis not present

## 2021-02-05 DIAGNOSIS — E782 Mixed hyperlipidemia: Secondary | ICD-10-CM | POA: Diagnosis not present

## 2021-02-05 DIAGNOSIS — E559 Vitamin D deficiency, unspecified: Secondary | ICD-10-CM | POA: Diagnosis not present

## 2021-02-05 DIAGNOSIS — R0602 Shortness of breath: Secondary | ICD-10-CM | POA: Diagnosis not present

## 2021-02-05 DIAGNOSIS — R1012 Left upper quadrant pain: Secondary | ICD-10-CM

## 2021-02-05 DIAGNOSIS — M5440 Lumbago with sciatica, unspecified side: Secondary | ICD-10-CM | POA: Diagnosis not present

## 2021-02-05 DIAGNOSIS — R300 Dysuria: Secondary | ICD-10-CM | POA: Diagnosis not present

## 2021-02-05 DIAGNOSIS — R1900 Intra-abdominal and pelvic swelling, mass and lump, unspecified site: Secondary | ICD-10-CM | POA: Diagnosis not present

## 2021-02-05 DIAGNOSIS — E039 Hypothyroidism, unspecified: Secondary | ICD-10-CM | POA: Diagnosis not present

## 2021-02-05 DIAGNOSIS — R5383 Other fatigue: Secondary | ICD-10-CM

## 2021-02-05 DIAGNOSIS — G8929 Other chronic pain: Secondary | ICD-10-CM | POA: Diagnosis not present

## 2021-02-05 DIAGNOSIS — I10 Essential (primary) hypertension: Secondary | ICD-10-CM

## 2021-02-05 DIAGNOSIS — Z0001 Encounter for general adult medical examination with abnormal findings: Secondary | ICD-10-CM | POA: Diagnosis not present

## 2021-02-05 NOTE — Progress Notes (Signed)
[facility] [place], [place] [address]  Internal MEDICINE  Office Visit Note  Patient Name: Jeremy Delgado  [id number]  [id number]  Date of Service: 02/07/2021  Chief Complaint  Patient presents with  . Medicare Wellness  . Results    Lab results done several months ago, had ultrasound also  . Quality Metric Gaps    Hep C screen, HIV screen, Tdap, Microalbumin (pt not a diabetic)     HPI Pt is here for routine health maintenance examination -Thinks he had Covid possibly in Dec though tested negative had exposure--SOB since then. Echo done in Dec due SOB and edema in legs -neuropathy in legs and big toes becoming more painful. Neuropathy from work injury in 2005. Neurosurgery did not want to do any operation at that time. Low back pain as well. Last MRI in 2018 showed DDD with impingement -Went to pain clinic in [place] and they did EMG that showed pinched nerves from back going down legs. Takes gabapentin and it helps some. -Swelling in LE hasnt been as bad but does still have days that swell more when he is inactive--But is concerned that they are swelling still and he is having SOB at times still. Reviewed echo showing normal LV and RV function, trace-mild TR, and a possible ASD--May consider cardiology referral if ongoing symptoms -Vaping daily, goes through cartridge in 2-3 days, synthetic nicotine in it (saltnic). Tried chantix once before and wasn't serious about it then. He is going to work on cutting back and ultimately quitting since he now wonders if SOB is related. Never had PFT, will order now -LUQ-small palpable mass with some discomfort. Notes he always has some discomfort near that area. -colonoscopy in 2019  Current Medication: Outpatient Encounter Medications as of 02/05/2021  Medication Sig Note  . amLODipine (NORVASC) 2.5 MG tablet Take 1 tablet (2.5 mg total) by mouth daily.   Marland Kitchen atorvastatin (LIPITOR) 40 MG tablet Take 1 tablet (40 mg  total) by mouth daily.   . clotrimazole-betamethasone (LOTRISONE) cream APPLY TO AFFECTED AREA TWICE A DAY   . cyclobenzaprine (FLEXERIL) 10 MG tablet TAKE 1 TABLET (10 MG TOTAL) BY MOUTH 2 (TWO) TIMES DAILY AS NEEDED (PAIN)   . FLUoxetine (PROZAC) 40 MG capsule Take 1 capsule (40 mg total) by mouth daily.   Marland Kitchen gabapentin (NEURONTIN) 600 MG tablet TAKE 1 TABLET BY MOUTH TWICE A DAY   . hydrochlorothiazide (HYDRODIURIL) 12.5 MG tablet TAKE 1 TABLET BY MOUTH EVERY DAY   . levothyroxine (SYNTHROID) 88 MCG tablet TAKE AS DIRECTED   . LINZESS 290 MCG CAPS capsule TAKE 1 CAPSULE (290 MCG TOTAL) BY MOUTH DAILY BEFORE BREAKFAST.   . Melatonin 3 MG CAPS Take 1 capsule by mouth at bedtime as needed (sleep).  09/27/2015: .   . meloxicam (MOBIC) 15 MG tablet Take 1 tablet (15 mg total) by mouth daily.   . mirtazapine (REMERON) 15 MG tablet TAKE 1 TABLET BY MOUTH EVERYDAY AT BEDTIME   . omeprazole (PRILOSEC) 40 MG capsule Take 1 capsule (40 mg total) by mouth daily.    No facility-administered encounter medications on file as of 02/05/2021.    Surgical History: Past Surgical History:  Procedure Laterality Date  . COLONOSCOPY WITH PROPOFOL N/A 08/14/2018   Procedure: COLONOSCOPY WITH PROPOFOL;  Surgeon: Jonathon Bellows, MD;  Location: [facility] ENDOSCOPY;  Service: Gastroenterology;  Laterality: N/A;  . ESOPHAGOGASTRODUODENOSCOPY (EGD) WITH PROPOFOL N/A 08/14/2018   Procedure: ESOPHAGOGASTRODUODENOSCOPY (EGD) WITH PROPOFOL;  Surgeon: Jonathon Bellows,  MD;  Location: [facility] ENDOSCOPY;  Service: Gastroenterology;  Laterality: N/A;  . HEMORRHOID SURGERY      Medical History: Past Medical History:  Diagnosis Date  . Addiction to drug ([place]) 04/06/2015  . ANXIETY 03/11/2008   Qualifier: Diagnosis of  By: Hassell Done FNP, Tori Milks     . Arthritis   . BACK PAIN 02/17/2007   Qualifier: Diagnosis of  By: Silvio Pate MD, Baird Cancer   . Chronic pain   . Colitis    self  . DEPRESSION 11/05/2003   Qualifier: Diagnosis of  By: Hassell Done FNP,  Tori Milks    . GERD 12/29/2008   Qualifier: Diagnosis of  By: Hassell Done FNP, Tori Milks    . H/O arthritis 02/20/2015  . H/O: hypothyroidism 02/20/2015  . HYPERCHOLESTEROLEMIA 03/11/2008   Qualifier: Diagnosis of  By: Hassell Done FNP, Tori Milks    . Hypertension   . PAIN IN JOINT PELVIC REGION AND THIGH 03/11/2008   Qualifier: Diagnosis of  By: Hassell Done FNP, Tori Milks    . Renal disorder   . Sciatica   . Thyroid disease   . TOBACCO ABUSE 07/15/2008   Qualifier: Diagnosis of  By: Hassell Done FNP, Tori Milks      Family History: Family History  Problem Relation Age of Onset  . Depression Mother   . Hypertension Father   . Heart failure Father   . Diabetes Paternal Grandfather       Review of Systems  Constitutional: Negative for chills, fatigue and unexpected weight change.  HENT: Negative for congestion, postnasal drip, rhinorrhea, sneezing and sore throat.   Eyes: Negative for redness.  Respiratory: Positive for shortness of breath. Negative for cough and chest tightness.   Cardiovascular: Positive for leg swelling. Negative for chest pain and palpitations.  Gastrointestinal: Positive for abdominal pain. Negative for constipation, diarrhea, nausea and vomiting.       Chronic LUQ mild discomfort  Genitourinary: Negative for dysuria and frequency.  Musculoskeletal: Positive for arthralgias and back pain. Negative for joint swelling and neck pain.  Skin: Negative for rash.  Neurological: Positive for numbness. Negative for tremors.  Hematological: Negative for adenopathy. Does not bruise/bleed easily.  Psychiatric/Behavioral: Negative for behavioral problems (Depression), sleep disturbance and suicidal ideas. The patient is not nervous/anxious.      Vital Signs: BP 122/78   Pulse 71   Temp 98.3 F (36.8 C)   Resp 16   Ht 6' (1.829 m)   Wt 213 lb 3.2 oz (96.7 kg)   SpO2 97%   BMI 28.92 kg/m    Physical Exam Vitals and nursing note reviewed.  Constitutional:      General: He is not in acute  distress.    Appearance: He is well-developed. He is not diaphoretic.  HENT:     Head: Normocephalic and atraumatic.     Mouth/Throat:     Pharynx: No oropharyngeal exudate.  Eyes:     Pupils: Pupils are equal, round, and reactive to light.  Neck:     Thyroid: No thyromegaly.     Vascular: No JVD.     Trachea: No tracheal deviation.  Cardiovascular:     Rate and Rhythm: Normal rate and regular rhythm.     Heart sounds: Normal heart sounds. No murmur heard. No friction rub. No gallop.   Pulmonary:     Effort: Pulmonary effort is normal. No respiratory distress.     Breath sounds: No wheezing or rales.  Chest:     Chest wall: No tenderness.  Abdominal:  General: Bowel sounds are normal.     Palpations: Abdomen is soft. There is mass.     Tenderness: There is abdominal tenderness.     Comments: Small cystic like mass palpated in LUQ with some mild tenderness  Musculoskeletal:        General: Normal range of motion.     Cervical back: Normal range of motion and neck supple.     Right lower leg: Edema present.     Left lower leg: Edema present.     Comments: Trace edema bilaterally  Lymphadenopathy:     Cervical: No cervical adenopathy.  Skin:    General: Skin is warm and dry.  Neurological:     Mental Status: He is alert and oriented to person, place, and time.     Cranial Nerves: No cranial nerve deficit.     Sensory: Sensory deficit present.     Comments: Neuropathy in LE  Psychiatric:        Behavior: Behavior normal.        Thought Content: Thought content normal.        Judgment: Judgment normal.      LABS: Recent Results (from the past 2160 hour(s))  UA/M w/rflx Culture, Routine     Status: None   Collection Time: 02/05/21 11:23 AM   Specimen: Urine   Urine  Result Value Ref Range   Specific Gravity, UA 1.028 1.005 - 1.030   pH, UA 5.5 5.0 - 7.5   Color, UA Yellow Yellow   Appearance Ur Clear Clear   Leukocytes,UA Negative Negative   Protein,UA  Negative Negative/Trace   Glucose, UA Negative Negative   Ketones, UA Negative Negative   RBC, UA Negative Negative   Bilirubin, UA Negative Negative   Urobilinogen, Ur 0.2 0.2 - 1.0 mg/dL   Nitrite, UA Negative Negative   Microscopic Examination Comment     Comment: Microscopic follows if indicated.   Microscopic Examination See below:     Comment: Microscopic was indicated and was performed.   Urinalysis Reflex Comment     Comment: This specimen will not reflex to a Urine Culture.  Microscopic Examination     Status: None   Collection Time: 02/05/21 11:23 AM   Urine  Result Value Ref Range   WBC, UA None seen 0 - 5 /hpf   RBC None seen 0 - 2 /hpf   Epithelial Cells (non renal) None seen 0 - 10 /hpf   Casts None seen None seen /lpf   Bacteria, UA None seen None seen/Few        Assessment/Plan: 1. Encounter for general adult medical examination with abnormal findings Pt is due for routine fasting labs, he reports he had a colonoscopy in 2019  2. Essential hypertension Well controlled, continue HCTZ and norvasc  3. Acquired hypothyroidism Continue synthroid, will recheck levels  4. Chronic LUQ pain Will order [place] due to vague discomfort and finding of small cystic like mass near area - US Abdomen Complete  5. Mass of soft tissue of abdomen Will order [place] due to vague discomfort and finding of small cystic like mass in LUQ - US Abdomen Complete  6. SOB (shortness of breath) Pt has had SOB and has vaped for a long time will order baseline PFT. May also need cardiology referral if SOB and LE edema worsening since echo showed a possible ASD, but normal LV/RV - Pulmonary Function Test  7. Electronic cigarette use - Pulmonary Function Test Smoking cessation counseling: 1. Pt  acknowledges the risks of long term smoking, she will try to quite smoking. 2. Options for different medications including nicotine products, chewing gum, patch etc, Wellbutrin and Chantix is  discussed 3. Goal and date of compete cessation is discussed 4. Total time spent in smoking cessation is 10 min.  8. Mixed hyperlipidemia Continue atorvastatin  9. Chronic bilateral low back pain with sciatica, sciatica laterality unspecified Followed by pain management, takes gabapentin which helps some, may need new neurosurgery consult if worsening  10. Vitamin D deficiency - VITAMIN D 25 Hydroxy (Vit-D Deficiency, Fractures)  11. Other fatigue - CBC w/Diff/Platelet - Comprehensive metabolic panel - TSH + free T4 - Lipid Panel With LDL/HDL Ratio - VITAMIN D 25 Hydroxy (Vit-D Deficiency, Fractures)  12. Dysuria - UA/M w/rflx Culture, Routine   General Counseling: jaquarious grey understanding of the findings of todays visit and agrees with plan of treatment. I have discussed any further diagnostic evaluation that may be needed or ordered today. We also reviewed his medications today. he has been encouraged to call the office with any questions or concerns that should arise related to todays visit.    Counseling:    Orders Placed This Encounter  Procedures  . Microscopic Examination  . US Abdomen Complete  . UA/M w/rflx Culture, Routine  . CBC w/Diff/Platelet  . Comprehensive metabolic panel  . TSH + free T4  . Lipid Panel With LDL/HDL Ratio  . VITAMIN D 25 Hydroxy (Vit-D Deficiency, Fractures)  . Pulmonary Function Test    No orders of the defined types were placed in this encounter.   This patient was seen by Drema Dallas, PA-C in collaboration with Dr. Clayborn Bigness as a part of collaborative care agreement.  Total time spent:40 Minutes  Time spent includes review of chart, medications, test results, and follow up plan with the patient.     Lavera Guise, MD  Internal Medicine

## 2021-02-06 LAB — MICROSCOPIC EXAMINATION
Bacteria, UA: NONE SEEN
Casts: NONE SEEN /lpf
Epithelial Cells (non renal): NONE SEEN /hpf (ref 0–10)
RBC, Urine: NONE SEEN /hpf (ref 0–2)
WBC, UA: NONE SEEN /hpf (ref 0–5)

## 2021-02-06 LAB — UA/M W/RFLX CULTURE, ROUTINE
Bilirubin, UA: NEGATIVE
Glucose, UA: NEGATIVE
Ketones, UA: NEGATIVE
Leukocytes,UA: NEGATIVE
Nitrite, UA: NEGATIVE
Protein,UA: NEGATIVE
RBC, UA: NEGATIVE
Specific Gravity, UA: 1.028 (ref 1.005–1.030)
Urobilinogen, Ur: 0.2 mg/dL (ref 0.2–1.0)
pH, UA: 5.5 (ref 5.0–7.5)

## 2021-03-07 ENCOUNTER — Other Ambulatory Visit: Payer: Self-pay

## 2021-03-07 ENCOUNTER — Ambulatory Visit: Payer: Medicare Other

## 2021-03-07 DIAGNOSIS — G8929 Other chronic pain: Secondary | ICD-10-CM

## 2021-03-07 DIAGNOSIS — R1900 Intra-abdominal and pelvic swelling, mass and lump, unspecified site: Secondary | ICD-10-CM | POA: Diagnosis not present

## 2021-03-07 DIAGNOSIS — R1012 Left upper quadrant pain: Secondary | ICD-10-CM

## 2021-03-09 ENCOUNTER — Other Ambulatory Visit: Payer: Self-pay | Admitting: Hospice and Palliative Medicine

## 2021-03-09 DIAGNOSIS — E039 Hypothyroidism, unspecified: Secondary | ICD-10-CM

## 2021-04-04 ENCOUNTER — Other Ambulatory Visit: Payer: Self-pay

## 2021-04-04 DIAGNOSIS — I10 Essential (primary) hypertension: Secondary | ICD-10-CM

## 2021-04-04 MED ORDER — HYDROCHLOROTHIAZIDE 12.5 MG PO TABS: 12.5000 mg | ORAL_TABLET | Freq: Every day | ORAL | 0 refills | Status: DC

## 2021-04-18 ENCOUNTER — Ambulatory Visit (INDEPENDENT_AMBULATORY_CARE_PROVIDER_SITE_OTHER): Payer: Medicare Other | Admitting: Internal Medicine

## 2021-04-18 ENCOUNTER — Other Ambulatory Visit: Payer: Self-pay

## 2021-04-18 DIAGNOSIS — R0602 Shortness of breath: Secondary | ICD-10-CM

## 2021-04-18 LAB — PULMONARY FUNCTION TEST

## 2021-04-22 NOTE — Procedures (Signed)
[facility] [address] [place], [address]    Complete Pulmonary Function Testing Interpretation:  FINDINGS:  Forced vital capacity is increased FEV1 is increased. FVC ratio was decreased.  Total lung capacity is increased residual volume is increased residual internal capacity ratio was decreased.  FRC was increased.  Patient was not able to perform DLCO.  Postbronchodilator no significant change in FEV1 was noted  IMPRESSION:  Pulmonary function study is essentially within normal limits.  Jeremy Gee, MD [facility] Pulmonary Critical Care Medicine Sleep Medicine

## 2021-04-27 ENCOUNTER — Other Ambulatory Visit: Payer: Self-pay

## 2021-04-27 DIAGNOSIS — E782 Mixed hyperlipidemia: Secondary | ICD-10-CM

## 2021-04-27 MED ORDER — ATORVASTATIN CALCIUM 40 MG PO TABS: 40.0000 mg | ORAL_TABLET | Freq: Every day | ORAL | 1 refills | Status: DC

## 2021-05-10 ENCOUNTER — Ambulatory Visit: Payer: Medicare Other | Admitting: Physician Assistant

## 2021-05-10 ENCOUNTER — Encounter: Payer: Self-pay | Admitting: Physician Assistant

## 2021-05-10 ENCOUNTER — Other Ambulatory Visit: Payer: Self-pay

## 2021-05-10 DIAGNOSIS — R5383 Other fatigue: Secondary | ICD-10-CM | POA: Diagnosis not present

## 2021-05-10 DIAGNOSIS — F331 Major depressive disorder, recurrent, moderate: Secondary | ICD-10-CM | POA: Diagnosis not present

## 2021-05-10 DIAGNOSIS — G8929 Other chronic pain: Secondary | ICD-10-CM | POA: Diagnosis not present

## 2021-05-10 DIAGNOSIS — I10 Essential (primary) hypertension: Secondary | ICD-10-CM | POA: Diagnosis not present

## 2021-05-10 DIAGNOSIS — K219 Gastro-esophageal reflux disease without esophagitis: Secondary | ICD-10-CM

## 2021-05-10 DIAGNOSIS — E039 Hypothyroidism, unspecified: Secondary | ICD-10-CM | POA: Diagnosis not present

## 2021-05-10 MED ORDER — OMEPRAZOLE 40 MG PO CPDR: 40.0000 mg | DELAYED_RELEASE_CAPSULE | Freq: Every day | ORAL | 1 refills | Status: DC

## 2021-05-10 MED ORDER — LEVOTHYROXINE SODIUM 88 MCG PO TABS: ORAL_TABLET | ORAL | 0 refills | Status: DC

## 2021-05-10 NOTE — Progress Notes (Addendum)
[facility] [place], [place] [address]  Internal MEDICINE  Office Visit Note  Patient Name: Jeremy Delgado  [id number]  [id number]  Date of Service: 05/16/2021  Chief Complaint  Patient presents with   Follow-up    Pft and [place] results, discuss meds    Depression   Gastroesophageal Reflux   Hyperlipidemia   Hypertension   Anxiety   Quality Metric Gaps    pneumovax    HPI Pt is here for routine follow up -Planning to stop methadone treatment, but wondering if clonidine could be given for 8-10 pills while stopping this. Did the methadone as a pain reliever. Tired of going daily and states they keep changing his phases and still has to go 6days a week. And cant keep travelling especially with gas prices. Would be willing to enter pain management as he understands we cannot take over his pain control if methadone is stopped. Plans to taper off methadone slowly over several months and will check back in regarding progress/plans and determine next steps -BP not checked at home, a little high today -Requesting testosterone levels be checked, and still needs to have other labs drawn -Reviewed PFT was essentially normal and abdominal US was normal as well  Current Medication: Outpatient Encounter Medications as of 05/10/2021  Medication Sig Note   amLODipine (NORVASC) 2.5 MG tablet Take 1 tablet (2.5 mg total) by mouth daily.    atorvastatin (LIPITOR) 40 MG tablet Take 1 tablet (40 mg total) by mouth daily.    FLUoxetine (PROZAC) 40 MG capsule Take 1 capsule (40 mg total) by mouth daily.    gabapentin (NEURONTIN) 600 MG tablet TAKE 1 TABLET BY MOUTH TWICE A DAY    hydrochlorothiazide (HYDRODIURIL) 12.5 MG tablet Take 1 tablet (12.5 mg total) by mouth daily.    LINZESS 290 MCG CAPS capsule TAKE 1 CAPSULE (290 MCG TOTAL) BY MOUTH DAILY BEFORE BREAKFAST.    mirtazapine (REMERON) 15 MG tablet TAKE 1 TABLET BY MOUTH EVERYDAY AT BEDTIME    [DISCONTINUED] levothyroxine  (SYNTHROID) 88 MCG tablet TAKE 1 TABLET BY MOUTH EVERY DAY IN THE MORNING    [DISCONTINUED] omeprazole (PRILOSEC) 40 MG capsule Take 1 capsule (40 mg total) by mouth daily.    levothyroxine (SYNTHROID) 88 MCG tablet TAKE 1 TABLET BY MOUTH EVERY DAY IN THE MORNING    omeprazole (PRILOSEC) 40 MG capsule Take 1 capsule (40 mg total) by mouth daily.    [DISCONTINUED] clotrimazole-betamethasone (LOTRISONE) cream APPLY TO AFFECTED AREA TWICE A DAY (Patient not taking: Reported on 05/10/2021)    [DISCONTINUED] cyclobenzaprine (FLEXERIL) 10 MG tablet TAKE 1 TABLET (10 MG TOTAL) BY MOUTH 2 (TWO) TIMES DAILY AS NEEDED (PAIN) (Patient not taking: Reported on 05/10/2021)    [DISCONTINUED] Melatonin 3 MG CAPS Take 1 capsule by mouth at bedtime as needed (sleep).  (Patient not taking: Reported on 05/10/2021) 09/27/2015: .    [DISCONTINUED] meloxicam (MOBIC) 15 MG tablet Take 1 tablet (15 mg total) by mouth daily. (Patient not taking: Reported on 05/10/2021)    No facility-administered encounter medications on file as of 05/10/2021.    Surgical History: Past Surgical History:  Procedure Laterality Date   COLONOSCOPY WITH PROPOFOL N/A 08/14/2018   Procedure: COLONOSCOPY WITH PROPOFOL;  Surgeon: Jonathon Bellows, MD;  Location[facility] ENDOSCOPY;  Service: Gastroenterology;  Laterality: N/A;   ESOPHAGOGASTRODUODENOSCOPY (EGD) WITH PROPOFOL N/A 08/14/2018   Procedure: ESOPHAGOGASTRODUODENOSCOPY (EGD) WITH PROPOFOL;  Surgeon: Jonathon Bellows, MD;  Location[facility] ENDOSCOPY;  Service: Gastroenterology;  Laterality: N/A;   HEMORRHOID SURGERY      Medical History: Past Medical History:  Diagnosis Date   Addiction to drug ([place]) 04/06/2015   ANXIETY 03/11/2008   Qualifier: Diagnosis of  By: Hassell Done FNP, Nykedtra      Arthritis    BACK PAIN 02/17/2007   Qualifier: Diagnosis of  By: Silvio Pate MD, [facility] Cancer    Chronic pain    Colitis    self   DEPRESSION 11/05/2003   Qualifier: Diagnosis of  By: Hassell Done FNP, Nykedtra     GERD 12/29/2008    Qualifier: Diagnosis of  By: Hassell Done FNP, Tori Milks     H/O arthritis 02/20/2015   H/O: hypothyroidism 02/20/2015   HYPERCHOLESTEROLEMIA 03/11/2008   Qualifier: Diagnosis of  By: Hassell Done FNP, Nykedtra     Hypertension    PAIN IN JOINT PELVIC REGION AND THIGH 03/11/2008   Qualifier: Diagnosis of  By: Hassell Done FNP, Nykedtra     Renal disorder    Sciatica    Thyroid disease    TOBACCO ABUSE 07/15/2008   Qualifier: Diagnosis of  By: Hassell Done FNP, Tori Milks      Family History: Family History  Problem Relation Age of Onset   Depression Mother    Hypertension Father    Heart failure Father    Diabetes Paternal Grandfather     Social History   Socioeconomic History   Marital status: Single    Spouse name: Not on file   Number of children: Not on file   Years of education: Not on file   Highest education level: Not on file  Occupational History   Not on file  Tobacco Use   Smoking status: Every Day    Packs/day: 0.50    Pack years: 0.00    Types: Cigarettes, E-cigarettes    Start date: 07/05/1990   Smokeless tobacco: Former   Tobacco comments:    pt uses vape and is wanting to stop 02/05/21  Vaping Use   Vaping Use: Every day  Substance and Sexual Activity   Alcohol use: Yes    Alcohol/week: 0.0 standard drinks    Comment: ocassionally   Drug use: Yes    Frequency: 2.0 times per week    Types: Marijuana   Sexual activity: Yes    Birth control/protection: Condom  Other Topics Concern   Not on file  Social History Narrative   Not on file   Social Determinants of Health   Financial Resource Strain: Not on file  Food Insecurity: Not on file  Transportation Needs: Not on file  Physical Activity: Not on file  Stress: Not on file  Social Connections: Not on file  Intimate Partner Violence: Not on file      Review of Systems  Constitutional:  Positive for fatigue. Negative for chills and unexpected weight change.  HENT:  Negative for congestion, postnasal drip, rhinorrhea,  sneezing and sore throat.   Eyes:  Negative for redness.  Respiratory:  Negative for cough, chest tightness and shortness of breath.   Cardiovascular:  Negative for chest pain and palpitations.  Gastrointestinal:  Negative for abdominal pain, constipation, diarrhea, nausea and vomiting.  Genitourinary:  Negative for dysuria and frequency.  Musculoskeletal:  Positive for arthralgias. Negative for back pain, joint swelling and neck pain.  Skin:  Negative for rash.  Neurological: Negative.  Negative for tremors and numbness.  Hematological:  Negative for adenopathy. Does not bruise/bleed easily.  Psychiatric/Behavioral:  Negative for behavioral problems (Depression), sleep disturbance and suicidal ideas. The  patient is nervous/anxious.    Vital Signs: BP 140/85 Comment: 150/84  Pulse 65   Temp (!) 97.2 F (36.2 C)   Resp 16   Ht 6' (1.829 m)   Wt 193 lb 9.6 oz (87.8 kg)   SpO2 98%   BMI 26.26 kg/m    Physical Exam Vitals and nursing note reviewed.  Constitutional:      General: He is not in acute distress.    Appearance: He is well-developed. He is not diaphoretic.  HENT:     Head: Normocephalic and atraumatic.     Mouth/Throat:     Pharynx: No oropharyngeal exudate.  Eyes:     Pupils: Pupils are equal, round, and reactive to light.  Neck:     Thyroid: No thyromegaly.     Vascular: No JVD.     Trachea: No tracheal deviation.  Cardiovascular:     Rate and Rhythm: Normal rate and regular rhythm.     Heart sounds: Normal heart sounds. No murmur heard.   No friction rub. No gallop.  Pulmonary:     Effort: Pulmonary effort is normal. No respiratory distress.     Breath sounds: No wheezing or rales.  Chest:     Chest wall: No tenderness.  Abdominal:     General: Bowel sounds are normal.     Palpations: Abdomen is soft.  Musculoskeletal:        General: Normal range of motion.     Cervical back: Normal range of motion and neck supple.  Lymphadenopathy:     Cervical: No  cervical adenopathy.  Skin:    General: Skin is warm and dry.  Neurological:     Mental Status: He is alert and oriented to person, place, and time.     Cranial Nerves: No cranial nerve deficit.  Psychiatric:        Behavior: Behavior normal.        Thought Content: Thought content normal.        Judgment: Judgment normal.       Assessment/Plan: 1. Essential hypertension Elevated in office today, will have pt log BP at home since normally well controlled and continue current medication for now  2. Acquired hypothyroidism Continue synthroid--will update labs - levothyroxine (SYNTHROID) 88 MCG tablet; TAKE 1 TABLET BY MOUTH EVERY DAY IN THE MORNING  Dispense: 90 tablet; Refill: 0  3. Moderate episode of recurrent major depressive disorder (HCC) Stable, continue prozac  4. Gastroesophageal reflux disease without esophagitis - omeprazole (PRILOSEC) 40 MG capsule; Take 1 capsule (40 mg total) by mouth daily.  Dispense: 90 capsule; Refill: 1  5. Other chronic pain Seeking to taper down on methadone, discussed that we would not be able to manage pain and would be referred to pain management if indicated  6. Other fatigue Will have other labs drawn and add testosterone - Testosterone,Free and Total   General Counseling: devynn scheff understanding of the findings of todays visit and agrees with plan of treatment. I have discussed any further diagnostic evaluation that may be needed or ordered today. We also reviewed his medications today. he has been encouraged to call the office with any questions or concerns that should arise related to todays visit.    Orders Placed This Encounter  Procedures   Testosterone,Free and Total   Meds ordered this encounter  Medications   omeprazole (PRILOSEC) 40 MG capsule    Sig: Take 1 capsule (40 mg total) by mouth daily.    Dispense:  90  capsule    Refill:  1   levothyroxine (SYNTHROID) 88 MCG tablet    Sig: TAKE 1 TABLET BY MOUTH  EVERY DAY IN THE MORNING    Dispense:  90 tablet    Refill:  0     This patient was seen by Drema Dallas, PA-C in collaboration with Dr. Clayborn Bigness as a part of collaborative care agreement.   Total time spent:35 Minutes Time spent includes review of chart, medications, test results, and follow up plan with the patient.      Dr Lavera Guise Internal medicine

## 2021-05-16 ENCOUNTER — Encounter: Payer: Self-pay | Admitting: Physician Assistant

## 2021-06-08 ENCOUNTER — Other Ambulatory Visit: Payer: Self-pay | Admitting: Internal Medicine

## 2021-06-08 DIAGNOSIS — E039 Hypothyroidism, unspecified: Secondary | ICD-10-CM

## 2021-07-01 ENCOUNTER — Other Ambulatory Visit: Payer: Self-pay | Admitting: Internal Medicine

## 2021-07-01 DIAGNOSIS — I10 Essential (primary) hypertension: Secondary | ICD-10-CM

## 2021-08-02 ENCOUNTER — Other Ambulatory Visit: Payer: Self-pay

## 2021-08-02 ENCOUNTER — Other Ambulatory Visit: Payer: Self-pay | Admitting: Internal Medicine

## 2021-08-02 DIAGNOSIS — G8929 Other chronic pain: Secondary | ICD-10-CM

## 2021-08-02 DIAGNOSIS — F321 Major depressive disorder, single episode, moderate: Secondary | ICD-10-CM

## 2021-08-02 DIAGNOSIS — I10 Essential (primary) hypertension: Secondary | ICD-10-CM

## 2021-08-02 MED ORDER — GABAPENTIN 600 MG PO TABS: 600.0000 mg | ORAL_TABLET | Freq: Two times a day (BID) | ORAL | 2 refills | Status: DC

## 2021-08-02 MED ORDER — FLUOXETINE HCL 40 MG PO CAPS: 40.0000 mg | ORAL_CAPSULE | Freq: Every day | ORAL | 1 refills | Status: DC

## 2021-08-02 MED ORDER — AMLODIPINE BESYLATE 2.5 MG PO TABS: 2.5000 mg | ORAL_TABLET | Freq: Every day | ORAL | 2 refills | Status: DC

## 2021-08-16 ENCOUNTER — Encounter: Payer: Self-pay | Admitting: Physician Assistant

## 2021-08-16 ENCOUNTER — Other Ambulatory Visit: Payer: Self-pay

## 2021-08-16 ENCOUNTER — Ambulatory Visit: Payer: Medicare Other | Admitting: Physician Assistant

## 2021-08-16 DIAGNOSIS — G8929 Other chronic pain: Secondary | ICD-10-CM | POA: Diagnosis not present

## 2021-08-16 DIAGNOSIS — Z23 Encounter for immunization: Secondary | ICD-10-CM | POA: Diagnosis not present

## 2021-08-16 DIAGNOSIS — E039 Hypothyroidism, unspecified: Secondary | ICD-10-CM | POA: Diagnosis not present

## 2021-08-16 DIAGNOSIS — I10 Essential (primary) hypertension: Secondary | ICD-10-CM | POA: Diagnosis not present

## 2021-08-16 DIAGNOSIS — F331 Major depressive disorder, recurrent, moderate: Secondary | ICD-10-CM | POA: Diagnosis not present

## 2021-08-16 MED ORDER — TETANUS-DIPHTH-ACELL PERTUSSIS 5-2.5-18.5 LF-MCG/0.5 IM SUSP: 0.5000 mL | Freq: Once | INTRAMUSCULAR | 0 refills | Status: AC

## 2021-08-16 MED ORDER — HYDROCHLOROTHIAZIDE 12.5 MG PO TABS: 12.5000 mg | ORAL_TABLET | Freq: Every day | ORAL | 0 refills | Status: DC

## 2021-08-16 MED ORDER — LEVOTHYROXINE SODIUM 88 MCG PO TABS: ORAL_TABLET | ORAL | 0 refills | Status: DC

## 2021-08-16 NOTE — Progress Notes (Signed)
[facility] [place], [place] [address]  Internal MEDICINE  Office Visit Note  Patient Name: Jeremy Delgado  [id number]  [id number]  Date of Service: 08/16/2021  Chief Complaint  Patient presents with   Follow-up   Hyperlipidemia   Hypertension   Depression   Anxiety    HPI Pt is here for routine follow up -He has a new grandson born 71 week ago and wants to make changes for him. He wants to try to stop vaping but has not tolerating medications to help cessation in past. May consider gum vs patch but discussed risks of these and that he cannot vape while patch is on. He may try to decrease frequency and find something else to occupy downtime when he would usually smoke. -BP at home 130s/80s -Has been having dizziness when he lays down or turns head to drive sometimes. Did discuss possible vestibular PT but mentions he was also told he has pinched nerves in neck years ago. -Takes 1 gabapentin per day instead of 2 usually  -Still going to methadone clinic, but has to go 6days per week since he was positive for marijuana no last screen. States the marijuana helps his pain. -Did not get labs yet and will go today though did have a redbull and fasting labs may be skewed by this, but he has a ride today and is the best time for him to get these done.  Current Medication: Outpatient Encounter Medications as of 08/16/2021  Medication Sig   amLODipine (NORVASC) 2.5 MG tablet Take 1 tablet (2.5 mg total) by mouth daily.   atorvastatin (LIPITOR) 40 MG tablet Take 1 tablet (40 mg total) by mouth daily.   FLUoxetine (PROZAC) 40 MG capsule Take 1 capsule (40 mg total) by mouth daily.   gabapentin (NEURONTIN) 600 MG tablet Take 1 tablet (600 mg total) by mouth 2 (two) times daily.   LINZESS 290 MCG CAPS capsule TAKE 1 CAPSULE (290 MCG TOTAL) BY MOUTH DAILY BEFORE BREAKFAST.   omeprazole (PRILOSEC) 40 MG capsule Take 1 capsule (40 mg total) by mouth daily.    [DISCONTINUED] hydrochlorothiazide (HYDRODIURIL) 12.5 MG tablet TAKE 1 TABLET BY MOUTH EVERY DAY   [DISCONTINUED] levothyroxine (SYNTHROID) 88 MCG tablet TAKE 1 TABLET BY MOUTH EVERY DAY IN THE MORNING   [DISCONTINUED] mirtazapine (REMERON) 15 MG tablet TAKE 1 TABLET BY MOUTH EVERYDAY AT BEDTIME   [DISCONTINUED] Tdap (BOOSTRIX) 5-2.5-18.5 LF-MCG/0.5 injection Inject 0.5 mLs into the muscle once.   hydrochlorothiazide (HYDRODIURIL) 12.5 MG tablet Take 1 tablet (12.5 mg total) by mouth daily.   levothyroxine (SYNTHROID) 88 MCG tablet Take 1 tablet by mouth every morning   Tdap (BOOSTRIX) 5-2.5-18.5 LF-MCG/0.5 injection Inject 0.5 mLs into the muscle once for 1 dose.   No facility-administered encounter medications on file as of 08/16/2021.    Surgical History: Past Surgical History:  Procedure Laterality Date   COLONOSCOPY WITH PROPOFOL N/A 08/14/2018   Procedure: COLONOSCOPY WITH PROPOFOL;  Surgeon: Jonathon Bellows, MD;  Location: [facility] ENDOSCOPY;  Service: Gastroenterology;  Laterality: N/A;   ESOPHAGOGASTRODUODENOSCOPY (EGD) WITH PROPOFOL N/A 08/14/2018   Procedure: ESOPHAGOGASTRODUODENOSCOPY (EGD) WITH PROPOFOL;  Surgeon: Jonathon Bellows, MD;  Location: [facility] ENDOSCOPY;  Service: Gastroenterology;  Laterality: N/A;   HEMORRHOID SURGERY      Medical History: Past Medical History:  Diagnosis Date   Addiction to drug ([place]) 04/06/2015   ANXIETY 03/11/2008   Qualifier: Diagnosis of  By: Hassell Done FNP, Tori Milks      Arthritis  BACK PAIN 02/17/2007   Qualifier: Diagnosis of  By: Silvio Pate MD, [facility]    Chronic pain    Colitis    self   DEPRESSION 11/05/2003   Qualifier: Diagnosis of  By: Hassell Done FNP, Nykedtra     GERD 12/29/2008   Qualifier: Diagnosis of  By: Hassell Done FNP, Tori Milks     H/O arthritis 02/20/2015   H/O: hypothyroidism 02/20/2015   HYPERCHOLESTEROLEMIA 03/11/2008   Qualifier: Diagnosis of  By: Hassell Done FNP, Nykedtra     Hypertension    PAIN IN JOINT [place]ELVIC REGION AND THIGH 03/11/2008    Qualifier: Diagnosis of  By: Hassell Done FNP, Nykedtra     Renal disorder    Sciatica    Thyroid disease    TOBACCO ABUSE 07/15/2008   Qualifier: Diagnosis of  By: Hassell Done FNP, Tori Milks      Family History: Family History  Problem Relation Age of Onset   Depression Mother    Hypertension Father    Heart failure Father    Diabetes Paternal Grandfather     Social History   Socioeconomic History   Marital status: Single    Spouse name: Not on file   Number of children: Not on file   Years of education: Not on file   Highest education level: Not on file  Occupational History   Not on file  Tobacco Use   Smoking status: Every Day    Packs/day: 0.50    Types: Cigarettes, E-cigarettes    Start date: 07/05/1990   Smokeless tobacco: Former   Tobacco comments:    pt uses vape and is wanting to stop 02/05/21  Vaping Use   Vaping Use: Every day  Substance and Sexual Activity   Alcohol use: Yes    Alcohol/week: 0.0 standard drinks    Comment: ocassionally   Drug use: Yes    Frequency: 2.0 times per week    Types: Marijuana   Sexual activity: Yes    Birth control/protection: Condom  Other Topics Concern   Not on file  Social History Narrative   Not on file   Social Determinants of Health   Financial Resource Strain: Not on file  Food Insecurity: Not on file  Transportation Needs: Not on file  Physical Activity: Not on file  Stress: Not on file  Social Connections: Not on file  Intimate Partner Violence: Not on file      Review of Systems  Constitutional:  Positive for fatigue. Negative for chills and unexpected weight change.  HENT:  Negative for congestion, postnasal drip, rhinorrhea, sneezing and sore throat.   Eyes:  Negative for redness.  Respiratory:  Negative for cough, chest tightness and shortness of breath.   Cardiovascular:  Negative for chest pain and palpitations.  Gastrointestinal:  Negative for abdominal pain, constipation, diarrhea, nausea and vomiting.   Genitourinary:  Negative for dysuria and frequency.  Musculoskeletal:  Positive for arthralgias. Negative for back pain, joint swelling and neck pain.  Skin:  Negative for rash.  Neurological: Negative.  Negative for tremors and numbness.  Hematological:  Negative for adenopathy. Does not bruise/bleed easily.  Psychiatric/Behavioral:  Negative for behavioral problems (Depression), sleep disturbance and suicidal ideas. The patient is nervous/anxious.    Vital Signs: BP 124/84   Pulse 64   Temp 97.8 F (36.6 C)   Resp 16   Ht 6' (1.829 m)   Wt 184 lb (83.5 kg)   BMI 24.95 kg/m    Physical Exam Vitals and nursing note reviewed.  Constitutional:  General: He is not in acute distress.    Appearance: He is well-developed. He is not diaphoretic.  HENT:     Head: Normocephalic and atraumatic.     Mouth/Throat:     Pharynx: No oropharyngeal exudate.  Eyes:     Pupils: Pupils are equal, round, and reactive to light.  Neck:     Thyroid: No thyromegaly.     Vascular: No JVD.     Trachea: No tracheal deviation.  Cardiovascular:     Rate and Rhythm: Normal rate and regular rhythm.     Heart sounds: Normal heart sounds. No murmur heard.   No friction rub. No gallop.  Pulmonary:     Effort: Pulmonary effort is normal. No respiratory distress.     Breath sounds: No wheezing or rales.  Chest:     Chest wall: No tenderness.  Abdominal:     General: Bowel sounds are normal.     Palpations: Abdomen is soft.  Musculoskeletal:        General: Normal range of motion.     Cervical back: Normal range of motion and neck supple.  Lymphadenopathy:     Cervical: No cervical adenopathy.  Skin:    General: Skin is warm and dry.  Neurological:     Mental Status: He is alert and oriented to person, place, and time.     Cranial Nerves: No cranial nerve deficit.  Psychiatric:        Behavior: Behavior normal.        Thought Content: Thought content normal.        Judgment: Judgment  normal.       Assessment/Plan: 1. Essential hypertension, benign Well-controlled, continue current medications - hydrochlorothiazide (HYDRODIURIL) 12.5 MG tablet; Take 1 tablet (12.5 mg total) by mouth daily.  Dispense: 90 tablet; Refill: 0  2. Acquired hypothyroidism Will update labs, continue Synthroid - levothyroxine (SYNTHROID) 88 MCG tablet; Take 1 tablet by mouth every morning  Dispense: 90 tablet; Refill: 0  3. Moderate episode of recurrent major depressive disorder ([place]) Well-controlled continue Prozac  4. Other chronic pain Continues gabapentin and goes to methadone clinic  5. Need for Tdap vaccination - Tdap ([place]) 5-2.5-18.5 LF-MCG/0.5 injection; Inject 0.5 mLs into the muscle once for 1 dose.  Dispense: 0.5 mL; Refill: 0   General Counseling: cali cuartas understanding of the findings of todays visit and agrees with plan of treatment. I have discussed any further diagnostic evaluation that may be needed or ordered today. We also reviewed his medications today. he has been encouraged to call the office with any questions or concerns that should arise related to todays visit.    No orders of the defined types were placed in this encounter.   Meds ordered this encounter  Medications   Tdap (BOOSTRIX) 5-2.5-18.5 LF-MCG/0.5 injection    Sig: Inject 0.5 mLs into the muscle once for 1 dose.    Dispense:  0.5 mL    Refill:  0   hydrochlorothiazide (HYDRODIURIL) 12.5 MG tablet    Sig: Take 1 tablet (12.5 mg total) by mouth daily.    Dispense:  90 tablet    Refill:  0   levothyroxine (SYNTHROID) 88 MCG tablet    Sig: Take 1 tablet by mouth every morning    Dispense:  90 tablet    Refill:  0    This patient was seen by Drema Dallas, PA-C in collaboration with Dr. Clayborn Bigness as a part of collaborative care agreement.   Total  time spent:30 Minutes Time spent includes review of chart, medications, test results, and follow up plan with the patient.       Dr Lavera Guise Internal medicine

## 2021-10-15 ENCOUNTER — Other Ambulatory Visit: Payer: Self-pay | Admitting: Internal Medicine

## 2021-10-15 DIAGNOSIS — E782 Mixed hyperlipidemia: Secondary | ICD-10-CM

## 2021-11-03 ENCOUNTER — Other Ambulatory Visit: Payer: Self-pay | Admitting: Physician Assistant

## 2021-11-03 DIAGNOSIS — K219 Gastro-esophageal reflux disease without esophagitis: Secondary | ICD-10-CM

## 2021-11-15 ENCOUNTER — Other Ambulatory Visit: Payer: Self-pay | Admitting: Physician Assistant

## 2021-11-15 ENCOUNTER — Ambulatory Visit: Payer: Medicare Other | Admitting: Physician Assistant

## 2021-11-15 DIAGNOSIS — E039 Hypothyroidism, unspecified: Secondary | ICD-10-CM

## 2021-12-07 ENCOUNTER — Ambulatory Visit (INDEPENDENT_AMBULATORY_CARE_PROVIDER_SITE_OTHER): Payer: Medicare Other | Admitting: Physician Assistant

## 2021-12-07 ENCOUNTER — Other Ambulatory Visit: Payer: Self-pay

## 2021-12-07 ENCOUNTER — Encounter: Payer: Self-pay | Admitting: Physician Assistant

## 2021-12-07 DIAGNOSIS — E559 Vitamin D deficiency, unspecified: Secondary | ICD-10-CM | POA: Diagnosis not present

## 2021-12-07 DIAGNOSIS — I10 Essential (primary) hypertension: Secondary | ICD-10-CM

## 2021-12-07 DIAGNOSIS — G8929 Other chronic pain: Secondary | ICD-10-CM

## 2021-12-07 DIAGNOSIS — F331 Major depressive disorder, recurrent, moderate: Secondary | ICD-10-CM | POA: Diagnosis not present

## 2021-12-07 DIAGNOSIS — Z125 Encounter for screening for malignant neoplasm of prostate: Secondary | ICD-10-CM

## 2021-12-07 DIAGNOSIS — R519 Headache, unspecified: Secondary | ICD-10-CM | POA: Diagnosis not present

## 2021-12-07 DIAGNOSIS — E782 Mixed hyperlipidemia: Secondary | ICD-10-CM | POA: Diagnosis not present

## 2021-12-07 DIAGNOSIS — R5383 Other fatigue: Secondary | ICD-10-CM

## 2021-12-07 DIAGNOSIS — E039 Hypothyroidism, unspecified: Secondary | ICD-10-CM | POA: Diagnosis not present

## 2021-12-07 MED ORDER — HYDROCHLOROTHIAZIDE 12.5 MG PO TABS: 12.5000 mg | ORAL_TABLET | Freq: Every day | ORAL | 1 refills | Status: DC

## 2021-12-07 NOTE — Progress Notes (Signed)
[facility], [place], [place] [address]  Internal MEDICINE  Office Visit Note  Patient Name: Jeremy Delgado  [id number]  [id number]  Date of Service: 12/12/2021  Chief Complaint  Patient presents with   Follow-up   Depression   Hyperlipidemia   Hypertension   Gastroesophageal Reflux   Medication Refill    Needs hydrochlorothiazide   Medication Management    Has started a multi vitamin and stool sofener    HPI Pt is here for routine follow up -Bp stable -dull headache for the past few weeks, not sleeping well. Has been sleeping during the day and then up at night.  -states the left side of his head is painful, and was told awhile back he may have cluster headaches. Based on location though he ma be having some possible TMJ related pain.  -Further discussed having an eye exam as well since it has been a long time since this was done. - has been taking 600mg  gabapentin in AM and may contribute to daytime grogginess. Will try 1/2 tab in Am and full tab in PM instead. Discussed sleep hygiene as well -methadone clinic, goes there 3days per week currently -still vaping, trying to cut back -did not have labs done--states it is hard for him to find a ride but will try to get these done before next visit.  Current Medication: Outpatient Encounter Medications as of 12/07/2021  Medication Sig   amLODipine (NORVASC) 2.5 MG tablet Take 1 tablet (2.5 mg total) by mouth daily.   atorvastatin (LIPITOR) 40 MG tablet TAKE 1 TABLET BY MOUTH EVERY DAY   FLUoxetine (PROZAC) 40 MG capsule Take 1 capsule (40 mg total) by mouth daily.   hydrochlorothiazide (HYDRODIURIL) 12.5 MG tablet Take 1 tablet (12.5 mg total) by mouth daily.   levothyroxine (SYNTHROID) 88 MCG tablet TAKE 1 TABLET BY MOUTH EVERY DAY IN THE MORNING   LINZESS 290 MCG CAPS capsule TAKE 1 CAPSULE (290 MCG TOTAL) BY MOUTH DAILY BEFORE BREAKFAST.   omeprazole (PRILOSEC) 40 MG capsule TAKE 1 CAPSULE (40 MG TOTAL)  BY MOUTH DAILY.   [DISCONTINUED] gabapentin (NEURONTIN) 600 MG tablet Take 1 tablet (600 mg total) by mouth 2 (two) times daily.   [DISCONTINUED] hydrochlorothiazide (HYDRODIURIL) 12.5 MG tablet Take 1 tablet (12.5 mg total) by mouth daily.   No facility-administered encounter medications on file as of 12/07/2021.    Surgical History: Past Surgical History:  Procedure Laterality Date   COLONOSCOPY WITH PROPOFOL N/A 08/14/2018   Procedure: COLONOSCOPY WITH PROPOFOL;  Surgeon: Jonathon Bellows, MD;  Location: [facility] ENDOSCOPY;  Service: Gastroenterology;  Laterality: N/A;   ESOPHAGOGASTRODUODENOSCOPY (EGD) WITH PROPOFOL N/A 08/14/2018   Procedure: ESOPHAGOGASTRODUODENOSCOPY (EGD) WITH PROPOFOL;  Surgeon: Jonathon Bellows, MD;  Location: [facility] ENDOSCOPY;  Service: Gastroenterology;  Laterality: N/A;   HEMORRHOID SURGERY      Medical History: Past Medical History:  Diagnosis Date   Addiction to drug ([place]) 04/06/2015   ANXIETY 03/11/2008   Qualifier: Diagnosis of  By: Hassell Done FNP, Nykedtra      Arthritis    BACK PAIN 02/17/2007   Qualifier: Diagnosis of  By: Silvio Pate MD, [facility] Cancer    Chronic pain    Colitis    self   DEPRESSION 11/05/2003   Qualifier: Diagnosis of  By: Hassell Done FNP, Nykedtra     GERD 12/29/2008   Qualifier: Diagnosis of  By: Hassell Done FNP, Tori Milks     H/O arthritis 02/20/2015   H/O: hypothyroidism 02/20/2015   HYPERCHOLESTEROLEMIA 03/11/2008  Qualifier: Diagnosis of  By: Hassell Done FNP, Nykedtra     Hypertension    PAIN IN JOINT PELVIC REGION AND THIGH 03/11/2008   Qualifier: Diagnosis of  By: Hassell Done FNP, Nykedtra     Renal disorder    Sciatica    Thyroid disease    TOBACCO ABUSE 07/15/2008   Qualifier: Diagnosis of  By: Hassell Done FNP, Tori Milks      Family History: Family History  Problem Relation Age of Onset   Depression Mother    Hypertension Father    Heart failure Father    Diabetes Paternal Grandfather     Social History   Socioeconomic History   Marital status: Single    Spouse  name: Not on file   Number of children: Not on file   Years of education: Not on file   Highest education level: Not on file  Occupational History   Not on file  Tobacco Use   Smoking status: Every Day    Packs/day: 0.50    Types: Cigarettes, E-cigarettes    Start date: 07/05/1990   Smokeless tobacco: Former   Tobacco comments:    Still using a vape daily  Vaping Use   Vaping Use: Every day  Substance and Sexual Activity   Alcohol use: Yes    Alcohol/week: 0.0 standard drinks    Comment: ocassionally   Drug use: Yes    Frequency: 2.0 times per week    Types: Marijuana   Sexual activity: Yes    Birth control/protection: Condom  Other Topics Concern   Not on file  Social History Narrative   Not on file   Social Determinants of Health   Financial Resource Strain: Not on file  Food Insecurity: Not on file  Transportation Needs: Not on file  Physical Activity: Not on file  Stress: Not on file  Social Connections: Not on file  Intimate Partner Violence: Not on file      Review of Systems  Constitutional:  Positive for fatigue. Negative for chills and unexpected weight change.  HENT:  Negative for congestion, postnasal drip, rhinorrhea, sneezing and sore throat.   Eyes:  Negative for redness.  Respiratory:  Negative for cough, chest tightness and shortness of breath.   Cardiovascular:  Negative for chest pain and palpitations.  Gastrointestinal:  Negative for abdominal pain, constipation, diarrhea, nausea and vomiting.  Genitourinary:  Negative for dysuria and frequency.  Musculoskeletal:  Positive for arthralgias. Negative for back pain, joint swelling and neck pain.  Skin:  Negative for rash.  Neurological:  Positive for headaches. Negative for tremors and numbness.  Hematological:  Negative for adenopathy. Does not bruise/bleed easily.  Psychiatric/Behavioral:  Positive for sleep disturbance. Negative for behavioral problems (Depression) and suicidal ideas. The  patient is nervous/anxious.    Vital Signs: BP 127/83    Pulse 75    Temp 98.6 F (37 C)    Resp 16    Ht 6' (1.829 m)    Wt 173 lb (78.5 kg)    SpO2 97%    BMI 23.46 kg/m    Physical Exam Vitals and nursing note reviewed.  Constitutional:      General: He is not in acute distress.    Appearance: He is well-developed. He is not diaphoretic.  HENT:     Head: Normocephalic and atraumatic.     Mouth/Throat:     Pharynx: No oropharyngeal exudate.  Eyes:     Pupils: Pupils are equal, round, and reactive to light.  Neck:  Thyroid: No thyromegaly.     Vascular: No JVD.     Trachea: No tracheal deviation.  Cardiovascular:     Rate and Rhythm: Normal rate and regular rhythm.     Heart sounds: Normal heart sounds. No murmur heard.   No friction rub. No gallop.  Pulmonary:     Effort: Pulmonary effort is normal. No respiratory distress.     Breath sounds: No wheezing or rales.  Chest:     Chest wall: No tenderness.  Abdominal:     General: Bowel sounds are normal.     Palpations: Abdomen is soft.  Musculoskeletal:        General: Normal range of motion.     Cervical back: Normal range of motion and neck supple.  Lymphadenopathy:     Cervical: No cervical adenopathy.  Skin:    General: Skin is warm and dry.  Neurological:     Mental Status: He is alert and oriented to person, place, and time.     Cranial Nerves: No cranial nerve deficit.  Psychiatric:        Behavior: Behavior normal.        Thought Content: Thought content normal.        Judgment: Judgment normal.       Assessment/Plan: 1. Essential hypertension, benign Stable, continue current medications - hydrochlorothiazide (HYDRODIURIL) 12.5 MG tablet; Take 1 tablet (12.5 mg total) by mouth daily.  Dispense: 90 tablet; Refill: 1  2. Moderate episode of recurrent major depressive disorder (HCC) Stable, continue prozac  3. Acquired hypothyroidism Will update labs and adjust synthroid as indicated - TSH +  free T4  4. Aching headache Etiology unclear, possible due to poor sleep, but possibly due to some TMJ as well and discussed treatment options. Also discussed updating an eye exam. Patient will call or seek help if acute worsening  5. Other chronic pain Continue gabapentin however reduce morning dose to 1/2 tab and may take full tab at night to help with daytime grogginess and poor sleep schedule. Also goes to methadone clinic  6. Mixed hyperlipidemia Continue lipitor and will update labs - Lipid Panel With LDL/HDL Ratio  7. Vitamin D deficiency - VITAMIN D 25 Hydroxy (Vit-D Deficiency, Fractures)  8. Special screening for malignant neoplasm of prostate - PSA Total (Reflex To Free)  9. Other fatigue - Testosterone,Free and Total - CBC w/Diff/Platelet - Comprehensive metabolic panel   General Counseling: loy mccartt understanding of the findings of todays visit and agrees with plan of treatment. I have discussed any further diagnostic evaluation that may be needed or ordered today. We also reviewed his medications today. he has been encouraged to call the office with any questions or concerns that should arise related to todays visit.    Orders Placed This Encounter  Procedures   Testosterone,Free and Total   CBC w/Diff/Platelet   Comprehensive metabolic panel   Lipid Panel With LDL/HDL Ratio   TSH + free T4   VITAMIN D 25 Hydroxy (Vit-D Deficiency, Fractures)   PSA Total (Reflex To Free)    Meds ordered this encounter  Medications   hydrochlorothiazide (HYDRODIURIL) 12.5 MG tablet    Sig: Take 1 tablet (12.5 mg total) by mouth daily.    Dispense:  90 tablet    Refill:  1    This patient was seen by Drema Dallas, PA-C in collaboration with Dr. Clayborn Bigness as a part of collaborative care agreement.   Total time spent:30 Minutes Time spent includes review  of chart, medications, test results, and follow up plan with the patient.      Dr Lavera Guise Internal medicine

## 2021-12-08 ENCOUNTER — Other Ambulatory Visit: Payer: Self-pay | Admitting: Physician Assistant

## 2021-12-08 DIAGNOSIS — G8929 Other chronic pain: Secondary | ICD-10-CM

## 2022-01-22 DIAGNOSIS — E559 Vitamin D deficiency, unspecified: Secondary | ICD-10-CM | POA: Diagnosis not present

## 2022-01-22 DIAGNOSIS — E039 Hypothyroidism, unspecified: Secondary | ICD-10-CM | POA: Diagnosis not present

## 2022-01-22 DIAGNOSIS — R5383 Other fatigue: Secondary | ICD-10-CM | POA: Diagnosis not present

## 2022-01-22 DIAGNOSIS — Z125 Encounter for screening for malignant neoplasm of prostate: Secondary | ICD-10-CM | POA: Diagnosis not present

## 2022-01-22 DIAGNOSIS — E782 Mixed hyperlipidemia: Secondary | ICD-10-CM | POA: Diagnosis not present

## 2022-01-23 LAB — PSA TOTAL (REFLEX TO FREE): Prostate Specific Ag, Serum: 0.4 ng/mL (ref 0.0–4.0)

## 2022-01-27 ENCOUNTER — Other Ambulatory Visit: Payer: Self-pay | Admitting: Physician Assistant

## 2022-01-27 DIAGNOSIS — F321 Major depressive disorder, single episode, moderate: Secondary | ICD-10-CM

## 2022-01-27 LAB — COMPREHENSIVE METABOLIC PANEL
ALT: 19 IU/L (ref 0–44)
AST: 14 IU/L (ref 0–40)
Albumin/Globulin Ratio: 1.6 (ref 1.2–2.2)
Albumin: 4.1 g/dL (ref 4.0–5.0)
Alkaline Phosphatase: 91 IU/L (ref 44–121)
BUN/Creatinine Ratio: 17 (ref 9–20)
BUN: 12 mg/dL (ref 6–24)
Bilirubin Total: 0.4 mg/dL (ref 0.0–1.2)
CO2: 28 mmol/L (ref 20–29)
Calcium: 9.3 mg/dL (ref 8.7–10.2)
Chloride: 100 mmol/L (ref 96–106)
Creatinine, Ser: 0.71 mg/dL — ABNORMAL LOW (ref 0.76–1.27)
Globulin, Total: 2.5 g/dL (ref 1.5–4.5)
Glucose: 96 mg/dL (ref 70–99)
Potassium: 4.2 mmol/L (ref 3.5–5.2)
Sodium: 138 mmol/L (ref 134–144)
Total Protein: 6.6 g/dL (ref 6.0–8.5)
eGFR: 112 mL/min/{1.73_m2} (ref >59)

## 2022-01-27 LAB — CBC WITH DIFFERENTIAL/PLATELET
Basophils Absolute: 0 10*3/uL (ref 0.0–0.2)
Basos: 0 %
EOS (ABSOLUTE): 0.1 10*3/uL (ref 0.0–0.4)
Eos: 2 %
Hematocrit: 40.4 % (ref 37.5–51.0)
Hemoglobin: 13.6 g/dL (ref 13.0–17.7)
Immature Grans (Abs): 0 10*3/uL (ref 0.0–0.1)
Immature Granulocytes: 0 %
Lymphocytes Absolute: 1.7 10*3/uL (ref 0.7–3.1)
Lymphs: 25 %
MCH: 30 pg (ref 26.6–33.0)
MCHC: 33.7 g/dL (ref 31.5–35.7)
MCV: 89 fL (ref 79–97)
Monocytes Absolute: 0.5 10*3/uL (ref 0.1–0.9)
Monocytes: 8 %
Neutrophils Absolute: 4.5 10*3/uL (ref 1.4–7.0)
Neutrophils: 65 %
Platelets: 356 10*3/uL (ref 150–450)
RBC: 4.54 x10E6/uL (ref 4.14–5.80)
RDW: 12.7 % (ref 11.6–15.4)
WBC: 6.9 10*3/uL (ref 3.4–10.8)

## 2022-01-27 LAB — VITAMIN D 25 HYDROXY (VIT D DEFICIENCY, FRACTURES): Vit D, 25-Hydroxy: 43.1 ng/mL (ref 30.0–100.0)

## 2022-01-27 LAB — LIPID PANEL WITH LDL/HDL RATIO
Cholesterol, Total: 136 mg/dL (ref 100–199)
HDL: 46 mg/dL (ref >39)
LDL Chol Calc (NIH): 64 mg/dL (ref 0–99)
LDL/HDL Ratio: 1.4 ratio (ref 0.0–3.6)
Triglycerides: 151 mg/dL — ABNORMAL HIGH (ref 0–149)
VLDL Cholesterol Cal: 26 mg/dL (ref 5–40)

## 2022-01-27 LAB — TESTOSTERONE,FREE AND TOTAL
Testosterone, Free: 1.9 pg/mL — ABNORMAL LOW (ref 7.2–24.0)
Testosterone: 288 ng/dL (ref 264–916)

## 2022-01-27 LAB — TSH+FREE T4
Free T4: 1.69 ng/dL (ref 0.82–1.77)
TSH: 2.16 u[IU]/mL (ref 0.450–4.500)

## 2022-02-09 ENCOUNTER — Other Ambulatory Visit: Payer: Self-pay | Admitting: Physician Assistant

## 2022-02-09 DIAGNOSIS — E039 Hypothyroidism, unspecified: Secondary | ICD-10-CM

## 2022-02-11 ENCOUNTER — Encounter: Payer: Self-pay | Admitting: Physician Assistant

## 2022-02-11 ENCOUNTER — Ambulatory Visit (INDEPENDENT_AMBULATORY_CARE_PROVIDER_SITE_OTHER): Payer: Medicare Other | Admitting: Physician Assistant

## 2022-02-11 ENCOUNTER — Telehealth: Payer: Self-pay

## 2022-02-11 DIAGNOSIS — I10 Essential (primary) hypertension: Secondary | ICD-10-CM

## 2022-02-11 DIAGNOSIS — G4710 Hypersomnia, unspecified: Secondary | ICD-10-CM | POA: Diagnosis not present

## 2022-02-11 DIAGNOSIS — E782 Mixed hyperlipidemia: Secondary | ICD-10-CM

## 2022-02-11 DIAGNOSIS — G8929 Other chronic pain: Secondary | ICD-10-CM | POA: Diagnosis not present

## 2022-02-11 DIAGNOSIS — E039 Hypothyroidism, unspecified: Secondary | ICD-10-CM | POA: Diagnosis not present

## 2022-02-11 DIAGNOSIS — R300 Dysuria: Secondary | ICD-10-CM

## 2022-02-11 DIAGNOSIS — Z0001 Encounter for general adult medical examination with abnormal findings: Secondary | ICD-10-CM

## 2022-02-11 DIAGNOSIS — F331 Major depressive disorder, recurrent, moderate: Secondary | ICD-10-CM | POA: Diagnosis not present

## 2022-02-11 NOTE — Progress Notes (Signed)
?[facility] ?[address] ?[place], [place] [address] ? ?Internal MEDICINE  ?Office Visit Note ? ?Patient Name: Jeremy Delgado ? [id number]  ?[id number] ? ?Date of Service: 02/11/2022 ? ?Chief Complaint  ?Patient presents with  ? Medicare Wellness  ?  Review lab work  ? Depression  ? Gastroesophageal Reflux  ? Hypertension  ? Hyperlipidemia  ? ? ? ?HPI ?Pt is here for routine health maintenance examination ?-Labs reviewed and overall looked good, cholesterol greatly improved, free testosterone was low ?-Up-to-date on colonoscopy ?-Gabapentin 1/2 in Am and full tab in evening helped some with daytime grogginess however does still have grogginess ?-Sleep study years ago and was on cpap briefly but then was told he may not need it and discontinued ?- is currently on $RemoveBefo'55mg'PmPSAAxqAjl$  methodone per day now, helps with pain ?-still vaping, was smoking previously for 30 years until switching to vape and is trying to quit. May need CT lung in future ?-snores at night and startles awake, wakes up feeling groggy and with headaches often ?EPWORTH SLEEPINESS SCALE: ? ?Scale:  ?(0)= no chance of dozing; (1)= slight chance of dozing; (2)= moderate chance of dozing; (3)= high chance of dozing ? ?Chance  Situtation ?   ?Sitting and reading: 3 ?  ? Watching TV: 2 ?   ?Sitting Inactive in public: 1 ?   ?As a passenger in car: 2   ?   ?Lying down to rest: 3 ?   ?Sitting and talking: 1 ?   ?Sitting quielty after lunch: 2 ?   ?In a car, stopped in traffic: 1 ? ? ?TOTAL SCORE:   15 out of 24 ? ?Current Medication: ?Outpatient Encounter Medications as of 02/11/2022  ?Medication Sig  ? amLODipine (NORVASC) 2.5 MG tablet Take 1 tablet (2.5 mg total) by mouth daily.  ? atorvastatin (LIPITOR) 40 MG tablet TAKE 1 TABLET BY MOUTH EVERY DAY  ? FLUoxetine (PROZAC) 40 MG capsule TAKE 1 CAPSULE (40 MG TOTAL) BY MOUTH DAILY.  ? gabapentin (NEURONTIN) 600 MG tablet TAKE 1 TABLET BY MOUTH TWICE A DAY  ? hydrochlorothiazide (HYDRODIURIL) 12.5 MG tablet  Take 1 tablet (12.5 mg total) by mouth daily.  ? levothyroxine (SYNTHROID) 88 MCG tablet TAKE 1 TABLET BY MOUTH EVERY DAY IN THE MORNING  ? LINZESS 290 MCG CAPS capsule TAKE 1 CAPSULE (290 MCG TOTAL) BY MOUTH DAILY BEFORE BREAKFAST.  ? omeprazole (PRILOSEC) 40 MG capsule TAKE 1 CAPSULE (40 MG TOTAL) BY MOUTH DAILY.  ? ?No facility-administered encounter medications on file as of 02/11/2022.  ? ? ?Surgical History: ?Past Surgical History:  ?Procedure Laterality Date  ? COLONOSCOPY WITH PROPOFOL N/A 08/14/2018  ? Procedure: COLONOSCOPY WITH PROPOFOL;  Surgeon: Jonathon Bellows, MD;  Location: [facility] ENDOSCOPY;  Service: Gastroenterology;  Laterality: N/A;  ? ESOPHAGOGASTRODUODENOSCOPY (EGD) WITH PROPOFOL N/A 08/14/2018  ? Procedure: ESOPHAGOGASTRODUODENOSCOPY (EGD) WITH PROPOFOL;  Surgeon: Jonathon Bellows, MD;  Location: [facility] ENDOSCOPY;  Service: Gastroenterology;  Laterality: N/A;  ? HEMORRHOID SURGERY    ? ? ?Medical History: ?Past Medical History:  ?Diagnosis Date  ? Addiction to drug ([place]) 04/06/2015  ? ANXIETY 03/11/2008  ? Qualifier: Diagnosis of  By: Hassell Done FNP, Tori Milks     ? Arthritis   ? BACK PAIN 02/17/2007  ? Qualifier: Diagnosis of  By: Silvio Pate MD, [facility]aird Cancer   ? Chronic pain   ? Colitis   ? self  ? DEPRESSION 11/05/2003  ? Qualifier: Diagnosis of  By: Hassell Done FNP, Tori Milks    ? GERD 12/29/2008  ?  Qualifier: Diagnosis of  By: Hassell Done FNP, Tori Milks    ? H/O arthritis 02/20/2015  ? H/O: hypothyroidism 02/20/2015  ? HYPERCHOLESTEROLEMIA 03/11/2008  ? Qualifier: Diagnosis of  By: Hassell Done FNP, Tori Milks    ? Hypertension   ? PAIN IN JOINT PELVIC REGION AND THIGH 03/11/2008  ? Qualifier: Diagnosis of  By: Hassell Done FNP, Tori Milks    ? Renal disorder   ? Sciatica   ? Thyroid disease   ? TOBACCO ABUSE 07/15/2008  ? Qualifier: Diagnosis of  By: Hassell Done FNP, Tori Milks    ? ? ?Family History: ?Family History  ?Problem Relation Age of Onset  ? Depression Mother   ? Hypertension Father   ? Heart failure Father   ? Diabetes Paternal Grandfather    ? ? ? ? ?Review of Systems  ?Constitutional:  Positive for fatigue. Negative for chills and unexpected weight change.  ?HENT:  Negative for congestion, postnasal drip, rhinorrhea, sneezing and sore throat.   ?Eyes:  Negative for redness.  ?Respiratory:  Negative for cough, chest tightness and shortness of breath.   ?Cardiovascular:  Negative for chest pain and palpitations.  ?Gastrointestinal:  Negative for abdominal pain, constipation, diarrhea, nausea and vomiting.  ?Genitourinary:  Negative for dysuria and frequency.  ?Musculoskeletal:  Positive for arthralgias. Negative for back pain, joint swelling and neck pain.  ?Skin:  Negative for rash.  ?Neurological:  Positive for headaches. Negative for tremors and numbness.  ?Hematological:  Negative for adenopathy. Does not bruise/bleed easily.  ?Psychiatric/Behavioral:  Positive for sleep disturbance. Negative for behavioral problems (Depression) and suicidal ideas. The patient is nervous/anxious.   ? ? ?Vital Signs: ?BP 132/84   Pulse 74   Temp 97.6 ?F (36.4 ?C)   Resp 16   Ht 6' (1.829 m)   Wt 167 lb 9.6 oz (76 kg)   SpO2 97%   BMI 22.73 kg/m?  ? ? ?Physical Exam ?Vitals and nursing note reviewed.  ?Constitutional:   ?   General: He is not in acute distress. ?   Appearance: He is well-developed and normal weight. He is not diaphoretic.  ?HENT:  ?   Head: Normocephalic and atraumatic.  ?   Mouth/Throat:  ?   Pharynx: No oropharyngeal exudate.  ?Eyes:  ?   Pupils: Pupils are equal, round, and reactive to light.  ?Neck:  ?   Thyroid: No thyromegaly.  ?   Vascular: No JVD.  ?   Trachea: No tracheal deviation.  ?Cardiovascular:  ?   Rate and Rhythm: Normal rate and regular rhythm.  ?   Heart sounds: Normal heart sounds. No murmur heard. ?  No friction rub. No gallop.  ?Pulmonary:  ?   Effort: Pulmonary effort is normal. No respiratory distress.  ?   Breath sounds: No wheezing or rales.  ?Chest:  ?   Chest wall: No tenderness.  ?Abdominal:  ?   General: Bowel  sounds are normal.  ?   Palpations: Abdomen is soft.  ?   Tenderness: There is no abdominal tenderness.  ?Musculoskeletal:     ?   General: Normal range of motion.  ?   Cervical back: Normal range of motion and neck supple.  ?Lymphadenopathy:  ?   Cervical: No cervical adenopathy.  ?Skin: ?   General: Skin is warm and dry.  ?Neurological:  ?   Mental Status: He is alert and oriented to person, place, and time.  ?   Cranial Nerves: No cranial nerve deficit.  ?Psychiatric:     ?  Behavior: Behavior normal.     ?   Thought Content: Thought content normal.     ?   Judgment: Judgment normal.  ? ? ? ?LABS: ?Recent Results (from the past 2160 hour(s))  ?Testosterone,Free and Total     Status: Abnormal  ? Collection Time: 01/22/22  7:53 AM  ?Result Value Ref Range  ? Testosterone 288 264 - 916 ng/dL  ?  Comment: Adult male reference interval is based on a population of ?healthy nonobese males (BMI <30) between 50 and 7 years old. ?[place], [place] 2017,102;1161-1173. PMID: [id number]. ?  ? Testosterone, Free 1.9 (L) 7.2 - 24.0 pg/mL  ?CBC w/Diff/Platelet     Status: None  ? Collection Time: 01/22/22  7:53 AM  ?Result Value Ref Range  ? WBC 6.9 3.4 - 10.8 x10E3/uL  ? RBC 4.54 4.14 - 5.80 x10E6/uL  ? Hemoglobin 13.6 13.0 - 17.7 g/dL  ? Hematocrit 40.4 37.5 - 51.0 %  ? MCV 89 79 - 97 fL  ? MCH 30.0 26.6 - 33.0 pg  ? MCHC 33.7 31.5 - 35.7 g/dL  ? RDW 12.7 11.6 - 15.4 %  ? Platelets 356 150 - 450 x10E3/uL  ? Neutrophils 65 Not Estab. %  ? Lymphs 25 Not Estab. %  ? Monocytes 8 Not Estab. %  ? Eos 2 Not Estab. %  ? Basos 0 Not Estab. %  ? Neutrophils Absolute 4.5 1.4 - 7.0 x10E3/uL  ? Lymphocytes Absolute 1.7 0.7 - 3.1 x10E3/uL  ? Monocytes Absolute 0.5 0.1 - 0.9 x10E3/uL  ? EOS (ABSOLUTE) 0.1 0.0 - 0.4 x10E3/uL  ? Basophils Absolute 0.0 0.0 - 0.2 x10E3/uL  ? Immature Granulocytes 0 Not Estab. %  ? Immature Grans (Abs) 0.0 0.0 - 0.1 x10E3/uL  ?Comprehensive metabolic panel     Status: Abnormal  ? Collection Time: 01/22/22   7:53 AM  ?Result Value Ref Range  ? Glucose 96 70 - 99 mg/dL  ? BUN 12 6 - 24 mg/dL  ? Creatinine, Ser 0.71 (L) 0.76 - 1.27 mg/dL  ? eGFR 112 >59 mL/min/1.73  ? BUN/Creatinine Ratio 17 9 - 20  ? Sodium 138 134 - 144 mmol/L

## 2022-02-11 NOTE — Telephone Encounter (Signed)
Sleep Study ordered. Printed. Gave to Titania-Toni ?

## 2022-02-12 LAB — UA/M W/RFLX CULTURE, ROUTINE
Bilirubin, UA: NEGATIVE
Glucose, UA: NEGATIVE
Ketones, UA: NEGATIVE
Leukocytes,UA: NEGATIVE
Nitrite, UA: NEGATIVE
Protein,UA: NEGATIVE
RBC, UA: NEGATIVE
Specific Gravity, UA: 1.022 (ref 1.005–1.030)
Urobilinogen, Ur: 0.2 mg/dL (ref 0.2–1.0)
pH, UA: 6 (ref 5.0–7.5)

## 2022-02-12 LAB — MICROSCOPIC EXAMINATION
Bacteria, UA: NONE SEEN
Casts: NONE SEEN /lpf
Epithelial Cells (non renal): NONE SEEN /hpf (ref 0–10)
WBC, UA: NONE SEEN /hpf (ref 0–5)

## 2022-03-08 ENCOUNTER — Telehealth: Payer: Self-pay

## 2022-03-08 NOTE — Telephone Encounter (Signed)
Patient scheduled for PSG on 04/04/22 @ [facility]tat ?

## 2022-04-09 ENCOUNTER — Encounter (INDEPENDENT_AMBULATORY_CARE_PROVIDER_SITE_OTHER): Payer: Medicare Other | Admitting: Internal Medicine

## 2022-04-09 DIAGNOSIS — G4719 Other hypersomnia: Secondary | ICD-10-CM | POA: Diagnosis not present

## 2022-04-09 DIAGNOSIS — G4710 Hypersomnia, unspecified: Secondary | ICD-10-CM | POA: Diagnosis not present

## 2022-04-11 NOTE — Procedures (Signed)
[place] Report Part I                                                                 Phone: [phone] Fax: [phone]  Patient Name: Jeremy Delgado, Jeremy Delgado Acquisition Number: [id number]  Date of Birth: 1970-12-03 Acquisition Date: 04/09/2022  Referring Physician: Drema Dallas, PA-C     History: The patient is a 51 year old male who was referred for re-evaluation of obstructive sleep apnea. Medical History: hypertension, neuropathy, addiction to drug, anxiety, back pain, chronic pain, colitis, depression, GERD, H/O arthritis, H/O hypothyroidism, hypercholesterolemia, hypertension, pain in joint pelvic region and thigh, renal disorder, thyroid disease, hypersomnis  and tobacco abuse.  Medications: gabapentin, melatonin, amlodipine, atorvastatin, fluoxetine, hydrochlorothiazide, levothyroxine, Linzess and omeprazole.  Procedure: This routine overnight polysomnogram was performed on the [facility] 5 using the standard diagnostic protocol. This included 6 channels of EEG, 2 channels of EOG, chin EMG, bilateral anterior tibialis EMG, nasal/oral thermistor, PTAF (nasal pressure transducer), chest and abdominal wall movements, EKG, and pulse oximetry.  Description: The total recording time was 374.8 minutes. The total sleep time was 311.5 minutes. There were a total of 48.4 minutes of wakefulness after sleep onset for a reducedsleep efficiency of 83.1%. The latency to sleep onset was within normal limits at 14.9 minutes. The R sleep onset latency was short at 49.0 minutes. Sleep parameters, as a percentage of the total sleep time, demonstrated 26.6% of sleep was in N1 sleep, 38.2% N2, 0.0% N3 and 35.2% R sleep. There were a total of 415 arousals for an arousal index of 79.9 arousals per hour of sleep that was elevated.  Respiratory monitoring demonstrated mild snoring. Supine sleep was not observed. There were 296 apneas and hypopneas for an Apnea Hypopnea Index of 57.0  apneas and hypopneas per hour of sleep. The REM related apnea hypopnea index was 35.6/hr of REM sleep compared to a NREM AHI of 68.0/hr.  Approximately 89% of the respiratory events were central apneas. The average duration of the respiratory events was 28.3 seconds with a maximum duration of 52.0 seconds. The respiratory events were associated with peripheral oxygen desaturations on the average to 92%. The lowest oxygen desaturation associated with a respiratory event was 79%. Additionally, the baseline oxygen saturation during wakefulness was 97%, during NREM sleep averaged 96%, and during REM sleep averaged  95%. The total duration of oxygen < 90% was 2.1 minutes.  Cardiac monitoring- did not demonstrate transient cardiac decelerations associated with the apneas. There were no significant cardiac rhythm irregularities.   Periodic limb movement monitoring- demonstrated that there were 30 periodic limb movements for a periodic limb movement index of 5.8 periodic limb movements per hour of sleep.   Impression: This routine overnight polysomnogram confirmed the presence of severe central and obstructive sleep apnea with an overall Apnea Hypopnea Index of 57.0 apneas and hypopneas per hour of sleep with the lowest desaturation to 79%.  Approximately 89% of the respiratory events were central apneas.  There were few periodic limb movements that commonly are not significant. Clinical correlation would be suggested.   There was a reduced sleep efficiency with anelevated arousal indexand no slow wave sleep. These findings would appear to be due to the central and obstructive sleep apnea.  Recommendations:    A CPAP titration would be recommended due to the severity of the sleep apnea. If central apneas persist, BiPAP SV may be indicated.    Allyne Gee, MD, [facility] Diplomate ABMS-Pulmonary, Critical Care and Sleep Medicine  Electronically reviewed and digitally signed   [place] Report Part II  Phone: [phone] Fax: [phone]  Patient last name Mcinerney Neck Size 15.0 in. Acquisition [id number]  Patient first name Nicolis Weight 165.0 lbs. Started 04/09/2022 at 11:33:18 PM  Birth date [birth year] Height 72.0 in. Stopped 04/10/2022 at 35:38:36 AM  Age 4 BMI 22.4 lb/in2 Duration 374.8  Study Type Adult      Report generated by Paulo Fruit., RPSGT  Reviewed by: Richelle Ito. Henke, PhD, ABSM, FAASM Sleep Data: Lights Out: 11:46:54 PM Sleep Onset: 12:01:48 AM  Lights On: 6:01:42 AM Sleep Efficiency: 83.1 %  Total Recording Time: 374.8 min Sleep Latency (from Lights Off) 14.9 min  Total Sleep Time (TST): 311.5 min R Latency (from Sleep Onset): 49.0 min  Sleep Period Time: 359.5 min Total number of awakenings: 14  Wake during sleep: 48.0 min Wake After Sleep Onset (WASO): 48.4 min   Sleep Data:         Arousal Summary: Stage  Latency from lights out (min) Latency from sleep onset (min) Duration (min) % Total Sleep Time  Normal values  N 1 14.9 0.0 83.0 26.6 (5%)  N 2 17.9 3.0 119.0 38.2 (50%)  N 3       0.0 0.0 (20%)  R 63.9 49.0 109.5 35.2 (25%)    Number Index  Spontaneous 162 31.2  Apneas & Hypopneas 239 46.0  RERAs 0 0.0       (Apneas & Hypopneas & RERAs)  (239) (46.0)  Limb Movement 14 2.7  Snore 0 0.0  TOTAL 415 79.9     Respiratory Data:  CA OA MA Apnea Central Hypopnea* A+ H RERA Total  Number 192 1 32 225 71 296 0 296  Mean Dur (sec) 29.3 26.5 29.5 29.3 25.1 28.3 0.0 28.3  Max Dur (sec) 48.0 26.5 52.0 52.0 47.5 52.0 0.0 52.0  Total Dur (min) 93.8 0.4 15.7 109.9 29.7 139.6 0.0 139.6  % of TST 30.1 0.1 5.0 35.3 9.5 44.8 0.0 44.8  Index (#/h TST) 37.0 0.2 6.2 43.3 13.7 57.0 0.0 57.0  *Hypopneas scored based on 4% or greater desaturation.  Sleep Stage:        REM NREM TST  AHI 35.6 68.0 57.0  RDI 35.6 68.0 57.0           Body Position Data:  Sleep (min) TST (%) REM (min) NREM (min) CA (#) OA (#) MA (#) C  HYP (#) AHI (#/h) RERA (#) RDI (#/h) Desat (#)  Supine    0.00                      0 0.00     Non-Supine 311.50 100.00 109.50 202.00 192.00 1.00 32.00 71.00 57.01 0 57.01 305.00  Left: 311.5 100.00 109.5 202.0 192 1 32 71 57.0 0 57.0 305  Prone: 0.0 0.00 0.0 0.0 0 0 0 0 0.0 0 0.00 0  Right: 0.0 0.00 0.0 0.0 0 0 0 0 0.0 0 0.00 0  UP: 0.0 0.00 0.0 0.0 0 0 0 0 0.0 0 0.00 0     Snoring: Total number of snoring episodes  0  Total time with snoring  min (   % of sleep)   Oximetry Distribution:             WK REM NREM TOTAL  Average (%)   97 95 96 96  < 90% 0.3 1.3 0.5 2.1  < 80% 0.0 0.0 0.0 0.0  < 70% 0.0 0.0 0.0 0.0  # of Desaturations* 6 36 100 142  Desat Index (#/hour) 6.7 19.7 29.8 27.4  Desat Max (%) '7 12 17 17  '$ Desat Max Dur (sec) 23.0 38.0 50.0 50.0  Approx Min O2 during sleep 79  Approx min O2 during a respiratory event 79  Was Oxygen added (Y/N) and final rate No:   0 LPM  *Desaturations based on 4% or greater drop from baseline.   Cheyne Stokes Breathing: None Present   Heart Rate Summary:  Average Heart Rate During Sleep 45.9 bpm      Highest Heart Rate During Sleep (95th %) 69.0 bpm      Highest Heart Rate During Sleep 255 bpm (artifact)  Highest Heart Rate During Recording (TIB) 255 bpm (artifact)   Heart Rate Observations: Event Type # Events   Bradycardia 0 Lowest HR Scored: N/A  Sinus Tachycardia During Sleep 0 Highest HR Scored: N/A  Narrow Complex Tachycardia 0 Highest HR Scored: N/A  Wide Complex Tachycardia 0 Highest HR Scored: N/A  Asystole 0 Longest Pause: N/A  Atrial Fibrillation 0 Duration Longest Event: N/A  Other Arrythmias  No Type:    Periodic Limb Movement Data: (Primary legs unless otherwise noted) Total # Limb Movement 53 Limb Movement Index 10.2  Total # PLMS 30 PLMS Index 5.8  Total # PLMS Arousals 8 PLMS Arousal Index 1.5  Percentage Sleep Time with PLMS 19.85mn (6.1 % sleep)  Mean Duration limb movements (secs) 227.9

## 2022-04-15 ENCOUNTER — Ambulatory Visit: Payer: Medicare Other | Admitting: Physician Assistant

## 2022-04-15 ENCOUNTER — Encounter: Payer: Self-pay | Admitting: Physician Assistant

## 2022-04-15 VITALS — BP 126/81 | HR 60 | Temp 98.2°F | Resp 16 | Ht 72.0 in | Wt 167.2 lb

## 2022-04-15 DIAGNOSIS — G4730 Sleep apnea, unspecified: Secondary | ICD-10-CM

## 2022-04-15 DIAGNOSIS — I10 Essential (primary) hypertension: Secondary | ICD-10-CM | POA: Diagnosis not present

## 2022-04-15 DIAGNOSIS — G8929 Other chronic pain: Secondary | ICD-10-CM

## 2022-04-15 NOTE — Progress Notes (Signed)
[facility] [place], [place] [address]  Internal MEDICINE  Office Visit Note  Patient Name: Jeremy Delgado  [id number]  [id number]  Date of Service: 04/15/2022  Chief Complaint  Patient presents with   Follow-up   Depression   Gastroesophageal Reflux   Hypertension   Hyperlipidemia   Labs Only    Pt would like to know how vitamin levels are looking - suggested by counselor this morning   Quality Metric Gaps    Tetanus Vaccine and Shingles    HPI Pt is here for routine follow up to review sleep study results -Patient reports he was on a cpap briefly a long time ago, but then was told he didn't really need it so he stopped. -Based on increasing symptoms an updated PSG was ordered and he was found to have Severe OSA with both obstructive and Central apneas -Discussed the importance of treating this and that this is likely a large contributor of why he is always tired. -Will move forward with titration study and then will be set up on machine pending results -he does state his counselor at methadone clinic suggested he have a vitamin study blood panel and was told methadone affects calcium. Discussed that his recent labs showed normal calcium and that his vitamin D was also normal but no other vitamins were screened and if any others are needed then he should be told exactly what is needed or they can order them. He states he thinks it really is the calcium they wanted to know and will update them on the labs  Current Medication: Outpatient Encounter Medications as of 04/15/2022  Medication Sig   amLODipine (NORVASC) 2.5 MG tablet Take 1 tablet (2.5 mg total) by mouth daily.   atorvastatin (LIPITOR) 40 MG tablet TAKE 1 TABLET BY MOUTH EVERY DAY   FLUoxetine (PROZAC) 40 MG capsule TAKE 1 CAPSULE (40 MG TOTAL) BY MOUTH DAILY.   gabapentin (NEURONTIN) 600 MG tablet TAKE 1 TABLET BY MOUTH TWICE A DAY   hydrochlorothiazide (HYDRODIURIL) 12.5 MG tablet Take 1  tablet (12.5 mg total) by mouth daily.   levothyroxine (SYNTHROID) 88 MCG tablet TAKE 1 TABLET BY MOUTH EVERY DAY IN THE MORNING   LINZESS 290 MCG CAPS capsule TAKE 1 CAPSULE (290 MCG TOTAL) BY MOUTH DAILY BEFORE BREAKFAST.   omeprazole (PRILOSEC) 40 MG capsule TAKE 1 CAPSULE (40 MG TOTAL) BY MOUTH DAILY.   No facility-administered encounter medications on file as of 04/15/2022.    Surgical History: Past Surgical History:  Procedure Laterality Date   COLONOSCOPY WITH PROPOFOL N/A 08/14/2018   Procedure: COLONOSCOPY WITH PROPOFOL;  Surgeon: Jonathon Bellows, MD;  Location: [facility] ENDOSCOPY;  Service: Gastroenterology;  Laterality: N/A;   ESOPHAGOGASTRODUODENOSCOPY (EGD) WITH PROPOFOL N/A 08/14/2018   Procedure: ESOPHAGOGASTRODUODENOSCOPY (EGD) WITH PROPOFOL;  Surgeon: Jonathon Bellows, MD;  Location: [facility] ENDOSCOPY;  Service: Gastroenterology;  Laterality: N/A;   HEMORRHOID SURGERY      Medical History: Past Medical History:  Diagnosis Date   Addiction to drug ([place]) 04/06/2015   ANXIETY 03/11/2008   Qualifier: Diagnosis of  By: Hassell Done FNP, [facility]      Arthritis    BACK PAIN 02/17/2007   Qualifier: Diagnosis of  By: Silvio Pate MD, [facility]    Chronic pain    Colitis    self   DEPRESSION 11/05/2003   Qualifier: Diagnosis of  By: Hassell Done FNP, [facility]     GERD 12/29/2008   Qualifier: Diagnosis of  By: Hassell Done FNP, Tori Milks  H/O arthritis 02/20/2015   H/O: hypothyroidism 02/20/2015   HYPERCHOLESTEROLEMIA 03/11/2008   Qualifier: Diagnosis of  By: Hassell Done FNP, [facility]     Hypertension    PAIN IN JOINT PELVIC REGION AND THIGH 03/11/2008   Qualifier: Diagnosis of  By: Hassell Done FNP, [facility]     Renal disorder    Sciatica    Thyroid disease    TOBACCO ABUSE 07/15/2008   Qualifier: Diagnosis of  By: Hassell Done FNP, Tori Milks      Family History: Family History  Problem Relation Age of Onset   Depression Mother    Hypertension Father    Heart failure Father    Diabetes Paternal Grandfather     Social  History   Socioeconomic History   Marital status: Single    Spouse name: Not on file   Number of children: Not on file   Years of education: Not on file   Highest education level: Not on file  Occupational History   Not on file  Tobacco Use   Smoking status: Every Day    Packs/day: 0.50    Types: Cigarettes, E-cigarettes    Start date: 07/05/1990   Smokeless tobacco: Former   Tobacco comments:    Still using a vape daily  Vaping Use   Vaping Use: Every day  Substance and Sexual Activity   Alcohol use: Yes    Alcohol/week: 0.0 standard drinks of alcohol    Comment: ocassionally   Drug use: Yes    Frequency: 2.0 times per week    Types: Marijuana   Sexual activity: Yes    Birth control/protection: Condom  Other Topics Concern   Not on file  Social History Narrative   Not on file   Social Determinants of Health   Financial Resource Strain: Not on file  Food Insecurity: Not on file  Transportation Needs: Not on file  Physical Activity: Not on file  Stress: Not on file  Social Connections: Not on file  Intimate Partner Violence: Not on file      Review of Systems  Constitutional:  Positive for fatigue. Negative for chills and unexpected weight change.  HENT:  Negative for congestion, postnasal drip, rhinorrhea, sneezing and sore throat.   Eyes:  Negative for redness.  Respiratory:  Negative for cough, chest tightness and shortness of breath.   Cardiovascular:  Negative for chest pain and palpitations.  Gastrointestinal:  Negative for abdominal pain, constipation, diarrhea, nausea and vomiting.  Genitourinary:  Negative for dysuria and frequency.  Musculoskeletal:  Positive for arthralgias. Negative for back pain, joint swelling and neck pain.  Skin:  Negative for rash.  Neurological:  Positive for headaches. Negative for tremors and numbness.  Hematological:  Negative for adenopathy. Does not bruise/bleed easily.  Psychiatric/Behavioral:  Positive for sleep  disturbance. Negative for behavioral problems (Depression) and suicidal ideas. The patient is nervous/anxious.     Vital Signs: BP 126/81   Pulse 60   Temp 98.2 F (36.8 C)   Resp 16   Ht 6' (1.829 m)   Wt 167 lb 3.2 oz (75.8 kg)   SpO2 97%   BMI 22.68 kg/m    Physical Exam Vitals and nursing note reviewed.  Constitutional:      General: He is not in acute distress.    Appearance: He is well-developed and normal weight. He is not diaphoretic.  HENT:     Head: Normocephalic and atraumatic.     Mouth/Throat:     Pharynx: No oropharyngeal exudate.  Eyes:  Pupils: Pupils are equal, round, and reactive to light.  Neck:     Thyroid: No thyromegaly.     Vascular: No JVD.     Trachea: No tracheal deviation.  Cardiovascular:     Rate and Rhythm: Normal rate and regular rhythm.     Heart sounds: Normal heart sounds. No murmur heard.    No friction rub. No gallop.  Pulmonary:     Effort: Pulmonary effort is normal. No respiratory distress.     Breath sounds: No wheezing or rales.  Chest:     Chest wall: No tenderness.  Abdominal:     General: Bowel sounds are normal.     Palpations: Abdomen is soft.     Tenderness: There is no abdominal tenderness.  Musculoskeletal:        General: Normal range of motion.     Cervical back: Normal range of motion and neck supple.  Lymphadenopathy:     Cervical: No cervical adenopathy.  Skin:    General: Skin is warm and dry.  Neurological:     Mental Status: He is alert and oriented to person, place, and time.     Cranial Nerves: No cranial nerve deficit.  Psychiatric:        Behavior: Behavior normal.        Thought Content: Thought content normal.        Judgment: Judgment normal.        Assessment/Plan: 1. Severe sleep apnea Will need titration and then be set up on appropriate machine--may need BIPAP SV if centrals persist on cpap per PSG recommendation - Cpap titration; Future  2. Essential hypertension,  benign Stable, continue current medication  3. Other chronic pain Followed by methadone clinic   General Counseling: zimri brennen understanding of the findings of todays visit and agrees with plan of treatment. I have discussed any further diagnostic evaluation that may be needed or ordered today. We also reviewed his medications today. he has been encouraged to call the office with any questions or concerns that should arise related to todays visit.    Orders Placed This Encounter  Procedures   Cpap titration    No orders of the defined types were placed in this encounter.   This patient was seen by Drema Dallas, PA-C in collaboration with Dr. Clayborn Bigness as a part of collaborative care agreement.   Total time spent:30 Minutes Time spent includes review of chart, medications, test results, and follow up plan with the patient.      Dr Lavera Guise Internal medicine

## 2022-04-16 ENCOUNTER — Other Ambulatory Visit: Payer: Self-pay | Admitting: Physician Assistant

## 2022-04-16 DIAGNOSIS — E782 Mixed hyperlipidemia: Secondary | ICD-10-CM

## 2022-04-25 ENCOUNTER — Telehealth: Payer: Self-pay

## 2022-04-25 NOTE — Telephone Encounter (Signed)
Patient scheduled for CPAP TITRATION on 04/27/22 @ [facility]

## 2022-04-27 ENCOUNTER — Encounter (INDEPENDENT_AMBULATORY_CARE_PROVIDER_SITE_OTHER): Payer: Medicare Other | Admitting: Internal Medicine

## 2022-04-27 DIAGNOSIS — G4733 Obstructive sleep apnea (adult) (pediatric): Secondary | ICD-10-CM

## 2022-05-13 NOTE — Procedures (Signed)
[place] Report Part I  Phone: [phone] Fax: [phone]  Patient Name: Jeremy Delgado, Jeremy Delgado. Acquisition Number: [id number]  Date of Birth: 11-09-1970 Acquisition Date: 04/27/2022  Referring Physician: Drema Dallas, PA-C     History: The patient is a 51 year old male with obstructive sleep apnea for switch to BIPAP. Medical History: hypertension, neuropathy, addiction to drug, anxiety, back pain, colitis, chronic pain, depression, GERD, H/O arthritis, H/O hypothyroidism, hypercholesterolemia, pain in joint pelvic region and thigh, renal disorder, thyroid disease, hypersomnis and tobacco use.  Medications: gabapentin, melatonin, amlodipine, atorvastatin, fluoxetine, levothyroxide, hydrochlorothiazide, linzess and omeprazole.  Procedure: This routine overnight polysomnogram was performed on the Alice 4 or 5 using the standard CPAP/BIPAP protocol. This included 6 channels of EEG, 2 channels of EOG, chin EMG, bilateral anterior tibialis EMG, nasal/oral thermister, PTAF (nasal pressure transducer), chest and abdominal wall movements, EKG, and pulse oximetry.  Description: The total recording time was 405.6 minutes. The total sleep time was 384.0 minutes. There were a total of 18.9 minutes of wakefulness after sleep onset for a goodsleep efficiency of 94.7%. The latency to sleep onset was short at 2.7 minutes. The R sleep onset latency was short at 38.5 minutes. Sleep parameters, as a percentage of the total sleep time, demonstrated 20.7% of sleep was in N1 sleep, 38.0% N2, 0.0% N3 and 41.3% R sleep. There were a total of 517 arousals for an arousal index of 80.8 arousals per hour of sleep that was elevated.  Overall, there were a total of 387 respiratory events for a respiratory disturbance index, which includes apneas, hypopneas and RERAs (increased respiratory effort) of 60.5 respiratory events per hour of sleep during the pressure titration. CPAP was  initiated at 4 cm of H2O at lights out, 11:44:17 PM. It was titrated in 1-2 cm increments to the final pressure of 23/18 cm of H2O.   Additionally, the baseline oxygen saturation during wakefulness was 98%, during NREM sleep averaged 97%, and during REM sleep averaged 97%. The total duration of oxygen < 90% was 1.5 minutes.  Cardiac monitoring- did not demonstrate transient cardiac decelerations associated with the apneas. There were no significant cardiac rhythm irregularities.   Periodic limb movement monitoring- demonstrated that there were 46 periodic limb movements for a periodic limb movement index of 7.2 periodic limb movements per hour of sleep.   Impression: This patient's obstructive sleep apnea demonstrated significant improvement with the utilization of nasal CPAP.   There were few periodic limb movements that commonly are not significant. Clinical correlation would be suggested.   Recommendations:  CPAP titration study is incomplete in controlling the patients OSA despite switching over to BIPAP during the night. Consider further evaluation with ASV. Nasal Decongestants and antihistamines may be of help for increased upper airway resistance. Weight loss through dietary and lifestyle modifications to include exercise is recommended in the presence of obesity. Alternative treatment options if the patient is not willing or is unable to use CPAP include oral appliances as well as surgical intervention in the right clinical setting. Clinical correlation is recommended. Please feel free to call the office for any further questions or assistance in the care of this patient.    Allyne Gee, MD [facility] Diplomate ABMS Pulmonary Critical Care Medicine Sleep Medicine Electronically reviewed and digitally signed    [place] CPAP/BIPAP Polysomnogram Report Part II Phone: [phone] Fax: [phone]  Patient last name [place] Neck Size 15.0 in. Acquisition [id number]  Patient first name Jeremy Delgado. Weight 165.0 lbs. Started 04/27/2022 at 11:35:59 PM  Birth date 11/28/1970 Height 72.0 in. Stopped 04/28/2022 at 7:50:41 AM  Age 51      Type Adult BMI 22.4 lb/in2 Duration 405.6  Report generated by Paulo Fruit., RPSGT Sleep Data: Lights Out: 11:44:17 PM Sleep Onset: 11:46:59 PM  Lights On: 6:29:53 AM Sleep Efficiency: 94.7 %  Total Recording Time: 405.6 min Sleep Latency (from Lights Off) 2.7 min  Total Sleep Time (TST): 384.0 min R Latency (from Sleep Onset): 38.5 min  Sleep Period Time: 398.5 min Total number of awakenings: 13  Wake during sleep: 14.5 min Wake After Sleep Onset (WASO): 18.9 min   Sleep Data:         Arousal Summary: Stage  Latency from lights out (min) Latency from sleep onset (min) Duration (min) % Total Sleep Time  Normal values  N 1 2.7 0.0 79.5 20.7 (5%)  N 2 5.2 2.5 146.0 38.0 (50%)  N 3       0.0 0.0 (20%)  R 41.2 38.5 158.5 41.3 (25%)    Number Index  Spontaneous 163 25.5  Apneas & Hypopneas 319 49.8  RERAs 0 0.0       (Apneas & Hypopneas & RERAs)  (319) (49.8)  Limb Movement 35 5.5  Snore 0 0.0  TOTAL 517 80.8     Respiratory Data:  CA OA MA Apnea Hypopnea* A+ H RERA Total  Number 220 12 125 357 30 387 0 387  Mean Dur (sec) 27.6 27.8 31.0 28.8 27.5 28.7 0.0 28.7  Max Dur (sec) 42.0 34.5 66.0 66.0 51.5 66.0 0.0 66.0  Total Dur (min) 101.0 5.6 64.5 171.1 13.8 184.9 0.0 184.9  % of TST 26.3 1.4 16.8 44.6 3.6 48.2 0.0 48.2  Index (#/h TST) 34.4 1.9 19.5 55.8 4.7 60.5 0.0 60.5  *Hypopneas scored based on 4% or greater desaturation.  Sleep Stage:         Body Position Data:    REM NREM TST  AHI 40.9 73.4 60.5  RDI 40.9 73.4 60.5    Sleep (min) TST (%) REM (min) NREM (min) CA (#) OA (#) MA (#) HYP (#) AHI (#/h) RERA (#) RDI (#/h) Desat (#)  Supine 83.4 21.72 48.5 34.9 53 '1 31 3 '$ 63.3 0 63.3 66  Non-Supine 300.60 78.28 110.00 190.60 167.00 11.00 94.00 27.00 59.68 0 59.68 269.00  Left: 300.6 78.28 110.0  190.6 167 11 94 27 59.7 0 59.7 269       Snoring: Total number of snoring episodes  0  Total time with snoring    min (   % of sleep)   Oximetry Distribution:             WK REM NREM TOTAL  Average (%)   98 97 97 97  < 90% 0.1 1.1 0.3 1.5  < 80% 0.0 0.0 0.0 0.0  < 70% 0.0 0.0 0.0 0.0  # of Desaturations* 2 60 182 244  Desat Index (#/hour) 6.4 22.7 49.0 38.4  Desat Max (%) '5 13 17 17  '$ Desat Max Dur (sec) 25.0 89.0 85.0 89.0  Approx Min O2 during sleep 82  Approx min O2 during a respiratory event 82  Was Oxygen added (Y/N) and final rate No:   0 LPM  *Desaturations based on 3% or greater drop from baseline.   Cheyne Stokes Breathing: None Present    Heart Rate Summary:  Average Heart Rate During Sleep 37.0 bpm  Highest Heart Rate During Sleep (95th %) 69.0 bpm      Highest Heart Rate During Sleep 255 bpm (artifact)  Highest Heart Rate During Recording (TIB) 255 bpm (artifact)   Heart Rate Observations: Event Type # Events   Bradycardia 0 Lowest HR Scored: N/A  Sinus Tachycardia During Sleep 0 Highest HR Scored: N/A  Narrow Complex Tachycardia 0 Highest HR Scored: N/A  Wide Complex Tachycardia 0 Highest HR Scored: N/A  Asystole 0 Longest Pause: N/A  Atrial Fibrillation 0 Duration Longest Event: N/A  Other Arrythmias  No Type:   Periodic Limb Movement Data: (Primary legs unless otherwise noted) Total # Limb Movement 73 Limb Movement Index 11.4  Total # PLMS 46 PLMS Index 7.2  Total # PLMS Arousals 22 PLMS Arousal Index 3.4  Percentage Sleep Time with PLMS 27.50mn (7.2 % sleep)  Mean Duration limb movements (secs) 206.8   Brief Summary:     Starting pressure:  cm of H2O Maximum Pressure:  cm of H2O  Mask Type:  Mask Size:   Humidifier used:  Chin Strap used:   CFlex:  BiFlex:     IPAP Level (cmH2O) EPAP Level (cmH2O) Total Duration (min) Sleep Duration (min) Sleep (%) REM (%) CA  #) OA # MA # HYP #) AHI (#/hr) RERAs # RERAs (#/hr) RDI (#/hr)  4 4 21.5 21.0  97.7 0.0 '14 6 6 2 '$ 80.0 0 0.0 80.0  6 6 12.5 12.0 96.0 0.0 9 0 3 0 60.0 0 0.0 60.0  8 4 19.7 19.7 100.0 36.5 8 0 6 0 42.6 0 0.0 42.'6  8 8 '$ 33.1 32.1 97.0 82.2 0 0 2 7 16.8 0 0.0 16.'8  9 5 '$ 11.9 10.4 87.4 41.2 6 0 5 1 69.2 0 0.0 69.'2  10 6 '$ 10.6 10.6 100.0 96.'2 11 1 3 '$ 0 84.9 0 0.0 84.'9  10 10 '$ 13.7 13.7 100.0 100.0 '7 1 6 2 '$ 70.1 0 0.0 70.'1  11 11 '$ 12.4 12.4 100.0 41.9 12 0 3 0 72.6 0 0.0 72.'6  12 8 '$ 14.6 14.6 100.0 0.0 12 0 6 1 78.1 0 0.0 78.'1  12 12 '$ 16.0 16.0 100.0 0.0 14 0 4 0 67.5 0 0.0 67.'5  13 9 '$ 32.4 32.4 100.0 32.7 30 0 4 2 66.7 0 0.0 66.'7  13 13 '$ 16.1 15.6 96.9 0.0 17 0 4 0 80.8 0 0.0 80.'8  14 10 '$ 13.4 13.4 100.0 100.0 3 0 3 2 35.8 0 0.0 35.'8  14 14 '$ 15.4 15.4 100.0 0.0 '8 1 10 '$ 0 74.0 0 0.0 74.0  15 11 14.3 14.3 100.0 100.0 '1 2 1 6 '$ 42.0 0 0.0 42.0  15 15 13.2 13.2 100.0 0.0 10 0 6 0 72.7 0 0.0 72.'7  16 16 '$ 18.3 18.3 100.0 0.0 16 0 7 1 78.7 0 0.0 78.'7  17 17 '$ 12.6 12.6 100.0 0.0 5 0 12 0 81.0 0 0.0 81.0  18 14 13.9 11.4 82.0 3.6 7 0 4 2 68.4 0 0.0 68.'4  20 16 '$ 25.9 19.9 76.8 36.3 8 0 12 0 60.3 0 0.0 60.'3  23 18 '$ 17.1 16.1 94.2 85.4 9 0 3 1 48.4 0 0.0 48.4

## 2022-05-26 ENCOUNTER — Other Ambulatory Visit: Payer: Self-pay | Admitting: Physician Assistant

## 2022-05-26 DIAGNOSIS — K219 Gastro-esophageal reflux disease without esophagitis: Secondary | ICD-10-CM

## 2022-06-04 ENCOUNTER — Telehealth: Payer: Self-pay

## 2022-06-04 NOTE — Telephone Encounter (Signed)
Patient scheduled for bipap titration on 06/07/22 @ [facility]

## 2022-06-06 ENCOUNTER — Other Ambulatory Visit: Payer: Self-pay | Admitting: Physician Assistant

## 2022-06-06 DIAGNOSIS — G8929 Other chronic pain: Secondary | ICD-10-CM

## 2022-06-06 DIAGNOSIS — F321 Major depressive disorder, single episode, moderate: Secondary | ICD-10-CM

## 2022-06-06 DIAGNOSIS — I10 Essential (primary) hypertension: Secondary | ICD-10-CM

## 2022-06-07 ENCOUNTER — Encounter (INDEPENDENT_AMBULATORY_CARE_PROVIDER_SITE_OTHER): Payer: Medicare Other | Admitting: Internal Medicine

## 2022-06-07 DIAGNOSIS — G4733 Obstructive sleep apnea (adult) (pediatric): Secondary | ICD-10-CM

## 2022-06-12 NOTE — Procedures (Signed)
[place] Report Part I  Phone: [phone] Fax: [phone]  Patient Name: Jeremy Delgado, Jeremy Delgado Acquisition Number: [id number]  Date of Birth: 10-14-1971 Acquisition Date: 06/07/2022  Referring Physician: Drema Dallas, PA-C     History: The patient is a 51 year old male with obstructive sleep apnea for a BIPAP titration. Medical History: hypertension, neuropathy, addiction to drug, anxiety, back pain, colitis, chronic pain, depression, GERD, arthritis, hypothyroidism, hypercholesterolemia, pain in joint pelvic region and thigh, renal disorder, thyroid disease, hypersomnis and tobacco use.  Medications: gabapentin, melatonin, amlodipine, atorvastatin, fluoxetine, levothyroxide, hydrochlorothiazide, linzess and omeprazole.  Procedure: This routine overnight polysomnogram was performed on the Alice 5 using the standard BIPAP SV protocol. This included 6 channels of EEG, 2 channels of EOG, chin EMG, bilateral anterior tibialis EMG, nasal/oral thermistor, PTAF (nasal pressure transducer), chest and abdominal wall movements, EKG, and pulse oximetry.  Description: The total recording time was 428.1 minutes. The total sleep time was 365.0 minutes. There were a total of 42.6 minutes of wakefulness after sleep onset for a reducedsleep efficiency of 85.3%. The latency to sleep onset was within normal limitsat 20.5 minutes. The R sleep onset latency was within normal limits at 66.5 minutes. Sleep parameters, as a percentage of the total sleep time, demonstrated 12.9% of sleep was in N1 sleep, 51.6% N2, 3.3% N3 and 32.2% R sleep. There were a total of 231 arousals for an arousal index of 38.0 arousals per hour of sleep that was elevated.  The patient did well at all BiPAP pressures. There were no respiratory events recorded during the pressure titration. BiPAP SV was initiated at the following settings at lights out, 10:37 p.m.: EPAPmin 5 cm H2O, EPAPmax 15 cm H2O, PSmin 3 cm  H2O, PSmax 15 cm H2O, IPAPmax 25, rate auto. The pressure remained at this setting throughout the study.  Additionally, the baseline oxygen saturation during wakefulness was 97%, during NREM sleep averaged 96%, and during REM sleep averaged 97%. The total duration of oxygen < 90% was 5.7 minutes and <80% was 0.0 minutes.  Cardiac monitoring-  There were no significant cardiac rhythm irregularities.   Periodic limb movement monitoring- did not demonstrate periodic limb movements.   Impression: This patient's obstructive sleep apnea demonstrated significant improvement with the utilization of nasal BiPAP SV.   Recommendations: Would recommend utilization of nasal BiPAP SV at the following settings: EPAPmin 5 cm H2O, EPAPmax 15 cm H2O, PSmin 3 cm H2O, PSmax 15 cm H2O, IPAPmax 25, rate auto.       A Simplus  mask, size-Medium , was used. Chin strap used during study-No. Humidifier used during study-Yes .     Allyne Gee, MD, [facility] Diplomate ABMS-Pulmonary, Critical Care and Sleep Medicine  Electronically reviewed and digitally signed   [place] CPAP/BIPAP Polysomnogram Report Part II Phone: [phone] Fax: [phone]  Patient last name Jeremy Delgado Neck Size 15.0 in. Acquisition [id number]  Patient first name Jeremy Delgado Weight 165.0 lbs. Started 06/07/2022 at 10:20:17 PM  Birth date 1971-09-11 Height 72.0 in. Stopped 06/08/2022 at 5:49:59 AM  Age 34      Type Adult BMI 22.4 lb/in2 Duration 428.1   Sleep Data: Lights Out: 10:37:17 PM Sleep Onset: 10:57:47 PM  Lights On: 5:45:23 AM Sleep Efficiency: 85.3 %  Total Recording Time: 428.1 min Sleep Latency (from Lights Off) 20.5 min  Total Sleep Time (TST): 365.0 min R Latency (from Sleep Onset): 66.5 min  Sleep Period Time: 407.5 min  Total number of awakenings: 31  Wake during sleep: 42.5 min Wake After Sleep Onset (WASO): 42.6 min   Sleep Data:         Arousal Summary: Stage  Latency from lights out (min) Latency from sleep  onset (min) Duration (min) % Total Sleep Time  Normal values  N 1 21.0 0.5 47.0 12.9 (5%)  N 2 20.5 0.0 188.5 51.6 (50%)  N 3 129.5 109.0 12.0 3.3 (20%)  R 87.0 66.5 117.5 32.2 (25%)    Number Index  Spontaneous 239 39.3  Apneas & Hypopneas 0 0.0  RERAs 0 0.0       (Apneas & Hypopneas & RERAs)  (0) (0.0)  Limb Movement 0 0.0  Snore 0 0.0  TOTAL 239 39.3     Respiratory Data:  CA OA MA Apnea Hypopnea* A+ H RERA Total  Number 0 0 0 0 0 0 0 0  Mean Dur (sec) 0.0 0.0 0.0 0.0 0.0 0.0 0.0 0.0  Max Dur (sec) 0.0 0.0 0.0 0.0 0.0 0.0 0.0 0.0  Total Dur (min) 0.0 0.0 0.0 0.0 0.0 0.0 0.0 0.0  % of TST 0.0 0.0 0.0 0.0 0.0 0.0 0.0 0.0  Index (#/h TST) 0.0 0.0 0.0 0.0 0.0 0.0 0.0 0.0  *Hypopneas scored based on 4% or greater desaturation.  Sleep Stage:         Body Position Data:    REM NREM TST  AHI 0.0 0.0 0.0  RDI 0.0 0.0 0.0    Sleep (min) TST (%) REM (min) NREM (min) CA (#) OA (#) MA (#) HYP (#) AHI (#/h) RERA (#) RDI (#/h) Desat (#)  Supine 208.0 56.99 69.5 138.5 0 0 0 0 0.0 0 0.00 54  Non-Supine 157.00 43.01 48.00 109.00 0.00 0.00 0.00 0.00 0.00 0 0.00 52.00  Left: 69.0 18.90 4.0 65.0 0 0 0 0 0.0 0 0.00 44  Right: 44.3 12.14 0.3 44.0 0 0 0 0 0.0 0 0.00 2  UP: 43.7 11.97 43.7 0.0 0 0 0 0 0.0 0 0.00 6       Snoring: Total number of snoring episodes  0  Total time with snoring    min (   % of sleep)   Oximetry Distribution:             WK REM NREM TOTAL  Average (%)   97 97 96 96  < 90% 0.7 0.3 4.7 5.7  < 80% 0.0 0.0 0.0 0.0  < 70% 0.0 0.0 0.0 0.0  # of Desaturations* 7 23 77 107  Desat Index (#/hour) 6.9 11.8 18.8 17.7  Desat Max (%) '7 12 15 15  '$ Desat Max Dur (sec) 70.0 55.0 81.0 81.0  Approx Min O2 during sleep 81  Approx min O2 during a respiratory event     Was Oxygen added (Y/N) and final rate No:   0 LPM  *Desaturations based on 3% or greater drop from baseline.   Cheyne Stokes Breathing: None Present    Heart Rate Summary:  Average Heart  Rate During Sleep 48.5 bpm      Highest Heart Rate During Sleep (95th %) 54.0 bpm      Highest Heart Rate During Sleep 72 bpm      Highest Heart Rate During Recording (TIB) 156 bpm (artifact)   Heart Rate Observations: Event Type # Events   Bradycardia 0 Lowest HR Scored: N/A  Sinus Tachycardia During Sleep 0 Highest HR Scored: N/A  Narrow Complex Tachycardia 0  Highest HR Scored: N/A  Wide Complex Tachycardia 0 Highest HR Scored: N/A  Asystole 0 Longest Pause: N/A  Atrial Fibrillation 0 Duration Longest Event: N/A  Other Arrythmias  No Type:   Periodic Limb Movement Data: (Primary legs unless otherwise noted) Total # Limb Movement 0 Limb Movement Index 0.0  Total # PLMS    PLMS Index     Total # PLMS Arousals    PLMS Arousal Index     Percentage Sleep Time with PLMS   min (   % sleep)  Mean Duration limb movements (secs)      Brief Summary:     Starting pressure:  cm of H2O Maximum Pressure:  cm of H2O  Mask Type:  Mask Size:   Humidifier used:  Chin Strap used:   CFlex:  BiFlex:     IPAP Level (cmH2O) EPAP Level (cmH2O) Total Duration (min) Sleep Duration (min) Sleep (%) REM (%) CA  #) OA # MA # HYP #) AHI (#/hr) RERAs # RERAs (#/hr) RDI (#/hr)  14 8 91.5 74.0 80.9 4.4 0 0 0 0 0.0 0 0.0 0.0  17 8 50.2 50.2 100.0 87.6 0 0 0 0 0.0 0 0.0 0.0  17 9 35.2 33.7 95.7 0.0 0 0 0 0 0.0 0 0.0 0.0  22 6 5.2 4.2 80.8 0.0 0 0 0 0 0.0 0 0.0 0.0  22 8 23.9 23.6 98.7 64.4 0 0 0 0 0.0 0 0.0 0.0  22 9 96.8 89.8 92.8 38.0 0 0 0 0 0.0 0 0.0 0.0  22 10 67.1 61.2 91.2 25.8 0 0 0 0 0.0 0 0.0 0.0  22 11 34.7 25.9 74.6 0.0 0 0 0 0 0.0 0 0.0 0.0  22 12 2.8 2.3 82.1 0.0 0 0 0 0 0.0 0 0.0 0.0

## 2022-07-15 ENCOUNTER — Ambulatory Visit (INDEPENDENT_AMBULATORY_CARE_PROVIDER_SITE_OTHER): Payer: Medicare Other | Admitting: Physician Assistant

## 2022-07-15 ENCOUNTER — Encounter: Payer: Self-pay | Admitting: Physician Assistant

## 2022-07-15 VITALS — BP 138/80 | HR 88 | Temp 98.2°F | Resp 16 | Ht 72.0 in | Wt 169.2 lb

## 2022-07-15 DIAGNOSIS — G4730 Sleep apnea, unspecified: Secondary | ICD-10-CM

## 2022-07-15 DIAGNOSIS — I10 Essential (primary) hypertension: Secondary | ICD-10-CM

## 2022-07-15 DIAGNOSIS — G8929 Other chronic pain: Secondary | ICD-10-CM

## 2022-07-15 NOTE — Progress Notes (Signed)
[facility] [place], [place] [address]  Internal MEDICINE  Office Visit Note  Patient Name: Jeremy Delgado  [id number]  [id number]  Date of Service: 07/19/2022  Chief Complaint  Patient presents with   Follow-up    Pt has not received results or CPAP machine from sleep study completed over 1 month ago   Depression   Gastroesophageal Reflux   Hypertension    Pt has stopped Amlodipine - Side effects: dizziness, unstable   Hyperlipidemia    HPI Pt is here for routine follow up -Found that amlodipine was making him dizzy about a month ago, Since stopping this a week ago this resolved. He would take it every few days and then stopped completely a week ago. -Bp at home since stopping has been 125/75 -Was originally told he would be getting PAP machine about 2 weeks after study but hasnt heard yet and is going to call to follow up to schedule set up date. Did feel much better the morning after titration study. He has severe OSA and needs to start on bipap. -Has been taking honey and this seems to help how he is feeling -handicap placard signed, limited walking due to pain  Current Medication: Outpatient Encounter Medications as of 07/15/2022  Medication Sig   atorvastatin (LIPITOR) 40 MG tablet TAKE 1 TABLET BY MOUTH EVERY DAY   FLUoxetine (PROZAC) 40 MG capsule TAKE 1 CAPSULE (40 MG TOTAL) BY MOUTH DAILY.   gabapentin (NEURONTIN) 600 MG tablet TAKE 1 TABLET BY MOUTH TWICE A DAY   hydrochlorothiazide (HYDRODIURIL) 12.5 MG tablet Take 1 tablet (12.5 mg total) by mouth daily.   levothyroxine (SYNTHROID) 88 MCG tablet TAKE 1 TABLET BY MOUTH EVERY DAY IN THE MORNING   LINZESS 290 MCG CAPS capsule TAKE 1 CAPSULE (290 MCG TOTAL) BY MOUTH DAILY BEFORE BREAKFAST.   omeprazole (PRILOSEC) 40 MG capsule TAKE 1 CAPSULE (40 MG TOTAL) BY MOUTH DAILY.   [DISCONTINUED] amLODipine (NORVASC) 2.5 MG tablet TAKE 1 TABLET BY MOUTH EVERY DAY   No facility-administered encounter  medications on file as of 07/15/2022.    Surgical History: Past Surgical History:  Procedure Laterality Date   COLONOSCOPY WITH PROPOFOL N/A 08/14/2018   Procedure: COLONOSCOPY WITH PROPOFOL;  Surgeon: Jonathon Bellows, MD;  Location: [facility] ENDOSCOPY;  Service: Gastroenterology;  Laterality: N/A;   ESOPHAGOGASTRODUODENOSCOPY (EGD) WITH PROPOFOL N/A 08/14/2018   Procedure: ESOPHAGOGASTRODUODENOSCOPY (EGD) WITH PROPOFOL;  Surgeon: Jonathon Bellows, MD;  Location: [facility] ENDOSCOPY;  Service: Gastroenterology;  Laterality: N/A;   HEMORRHOID SURGERY      Medical History: Past Medical History:  Diagnosis Date   Addiction to drug ([place]) 04/06/2015   ANXIETY 03/11/2008   Qualifier: Diagnosis of  By: Hassell Done FNP, Nykedtra      Arthritis    BACK PAIN 02/17/2007   Qualifier: Diagnosis of  By: Silvio Pate MD, [facility] Cancer    Chronic pain    Colitis    self   DEPRESSION 11/05/2003   Qualifier: Diagnosis of  By: Hassell Done FNP, Nykedtra     GERD 12/29/2008   Qualifier: Diagnosis of  By: Hassell Done FNP, Tori Milks     H/O arthritis 02/20/2015   H/O: hypothyroidism 02/20/2015   HYPERCHOLESTEROLEMIA 03/11/2008   Qualifier: Diagnosis of  By: Hassell Done FNP, Nykedtra     Hypertension    PAIN IN JOINT PELVIC REGION AND THIGH 03/11/2008   Qualifier: Diagnosis of  By: Hassell Done FNP, Tori Milks     Renal disorder    Sciatica    Thyroid  disease    TOBACCO ABUSE 07/15/2008   Qualifier: Diagnosis of  By: Hassell Done FNP, Tori Milks      Family History: Family History  Problem Relation Age of Onset   Depression Mother    Hypertension Father    Heart failure Father    Diabetes Paternal Grandfather     Social History   Socioeconomic History   Marital status: Single    Spouse name: Not on file   Number of children: Not on file   Years of education: Not on file   Highest education level: Not on file  Occupational History   Not on file  Tobacco Use   Smoking status: Every Day    Packs/day: 0.50    Types: Cigarettes, E-cigarettes    Start date:  07/05/1990   Smokeless tobacco: Former   Tobacco comments:    Still using a vape daily  Vaping Use   Vaping Use: Every day  Substance and Sexual Activity   Alcohol use: Yes    Alcohol/week: 0.0 standard drinks of alcohol    Comment: ocassionally   Drug use: Yes    Frequency: 2.0 times per week    Types: Marijuana   Sexual activity: Yes    Birth control/protection: Condom  Other Topics Concern   Not on file  Social History Narrative   Not on file   Social Determinants of Health   Financial Resource Strain: Not on file  Food Insecurity: Not on file  Transportation Needs: Not on file  Physical Activity: Not on file  Stress: Not on file  Social Connections: Not on file  Intimate Partner Violence: Not on file      Review of Systems  Constitutional:  Positive for fatigue. Negative for chills and unexpected weight change.  HENT:  Negative for congestion, postnasal drip, rhinorrhea, sneezing and sore throat.   Eyes:  Negative for redness.  Respiratory:  Negative for cough, chest tightness and shortness of breath.   Cardiovascular:  Negative for chest pain and palpitations.  Gastrointestinal:  Negative for abdominal pain, constipation, diarrhea, nausea and vomiting.  Genitourinary:  Negative for dysuria and frequency.  Musculoskeletal:  Positive for arthralgias. Negative for back pain, joint swelling and neck pain.  Skin:  Negative for rash.  Neurological:  Positive for headaches. Negative for tremors and numbness.  Hematological:  Negative for adenopathy. Does not bruise/bleed easily.  Psychiatric/Behavioral:  Positive for sleep disturbance. Negative for behavioral problems (Depression) and suicidal ideas. The patient is nervous/anxious.     Vital Signs: BP 138/80 Comment: 160/122  Pulse 88   Temp 98.2 F (36.8 C)   Resp 16   Ht 6' (1.829 m)   Wt 169 lb 3.2 oz (76.7 kg)   SpO2 97%   BMI 22.95 kg/m    Physical Exam Vitals and nursing note reviewed.  Constitutional:       General: He is not in acute distress.    Appearance: He is well-developed and normal weight. He is not diaphoretic.  HENT:     Head: Normocephalic and atraumatic.     Mouth/Throat:     Pharynx: No oropharyngeal exudate.  Eyes:     Pupils: Pupils are equal, round, and reactive to light.  Neck:     Thyroid: No thyromegaly.     Vascular: No JVD.     Trachea: No tracheal deviation.  Cardiovascular:     Rate and Rhythm: Normal rate and regular rhythm.     Heart sounds: Normal heart sounds. No murmur  heard.    No friction rub. No gallop.  Pulmonary:     Effort: Pulmonary effort is normal. No respiratory distress.     Breath sounds: No wheezing or rales.  Chest:     Chest wall: No tenderness.  Abdominal:     General: Bowel sounds are normal.     Palpations: Abdomen is soft.     Tenderness: There is no abdominal tenderness.  Musculoskeletal:        General: Normal range of motion.     Cervical back: Normal range of motion and neck supple.  Lymphadenopathy:     Cervical: No cervical adenopathy.  Skin:    General: Skin is warm and dry.  Neurological:     Mental Status: He is alert and oriented to person, place, and time.     Cranial Nerves: No cranial nerve deficit.  Psychiatric:        Behavior: Behavior normal.        Thought Content: Thought content normal.        Judgment: Judgment normal.        Assessment/Plan: 1. Severe sleep apnea Titration study completed last month and is waiting for set up on bipap SV. Pt will call [facility] for update on when he can get started on treatment  2. Essential hypertension, benign Stable despite stopping amlodipine, will continue to monitor and adjust alternative med if rising  3. Other chronic pain Followed by methadone clinic   General Counseling: darrow barreiro understanding of the findings of todays visit and agrees with plan of treatment. I have discussed any further diagnostic evaluation that may be needed or ordered  today. We also reviewed his medications today. he has been encouraged to call the office with any questions or concerns that should arise related to todays visit.    No orders of the defined types were placed in this encounter.   No orders of the defined types were placed in this encounter.   This patient was seen by Drema Dallas, PA-C in collaboration with Dr. Clayborn Bigness as a part of collaborative care agreement.   Total time spent:30 Minutes Time spent includes review of chart, medications, test results, and follow up plan with the patient.      Dr Lavera Guise Internal medicine

## 2022-08-31 ENCOUNTER — Other Ambulatory Visit: Payer: Self-pay | Admitting: Physician Assistant

## 2022-08-31 DIAGNOSIS — I10 Essential (primary) hypertension: Secondary | ICD-10-CM

## 2022-10-14 ENCOUNTER — Ambulatory Visit: Payer: Medicare Other | Admitting: Physician Assistant

## 2022-10-28 ENCOUNTER — Other Ambulatory Visit: Payer: Self-pay | Admitting: Physician Assistant

## 2022-10-28 DIAGNOSIS — E039 Hypothyroidism, unspecified: Secondary | ICD-10-CM

## 2022-10-28 DIAGNOSIS — E782 Mixed hyperlipidemia: Secondary | ICD-10-CM

## 2022-11-13 ENCOUNTER — Other Ambulatory Visit: Payer: Self-pay | Admitting: Physician Assistant

## 2022-11-13 DIAGNOSIS — K219 Gastro-esophageal reflux disease without esophagitis: Secondary | ICD-10-CM

## 2022-11-18 ENCOUNTER — Encounter: Payer: Self-pay | Admitting: Physician Assistant

## 2022-11-18 ENCOUNTER — Ambulatory Visit (INDEPENDENT_AMBULATORY_CARE_PROVIDER_SITE_OTHER): Payer: Medicare Other | Admitting: Physician Assistant

## 2022-11-18 VITALS — BP 129/86 | HR 66 | Temp 98.2°F | Resp 16 | Ht 72.0 in | Wt 180.8 lb

## 2022-11-18 DIAGNOSIS — F321 Major depressive disorder, single episode, moderate: Secondary | ICD-10-CM | POA: Diagnosis not present

## 2022-11-18 DIAGNOSIS — G8929 Other chronic pain: Secondary | ICD-10-CM | POA: Diagnosis not present

## 2022-11-18 DIAGNOSIS — G4730 Sleep apnea, unspecified: Secondary | ICD-10-CM

## 2022-11-18 DIAGNOSIS — I10 Essential (primary) hypertension: Secondary | ICD-10-CM | POA: Diagnosis not present

## 2022-11-18 MED ORDER — FLUOXETINE HCL 40 MG PO CAPS: 40.0000 mg | ORAL_CAPSULE | Freq: Every day | ORAL | 1 refills | Status: DC

## 2022-11-18 MED ORDER — GABAPENTIN 600 MG PO TABS
600.0000 mg | ORAL_TABLET | Freq: Two times a day (BID) | ORAL | 1 refills | Status: DC
Start: 2022-11-18 — End: 2024-02-06

## 2022-11-18 NOTE — Progress Notes (Signed)
[facility] [place], [place] [address]  Internal MEDICINE  Office Visit Note  Patient Name: Jeremy Delgado  [id number]  [id number]  Date of Service: 11/18/2022  Chief Complaint  Patient presents with   Follow-up   Depression   Gastroesophageal Reflux   Hypertension   Hyperlipidemia    HPI Pt is here for routine follow up -Taking honey daily and states it is helping pain in his feet and overall feels better taking this -cost of bipap SV was too much, but did just get new insurance and will have them run it under new insurance and check. Also advised to see if any payment programs/assistance as he has severe apnea and needs to be on treatment -BP stable  Current Medication: Outpatient Encounter Medications as of 11/18/2022  Medication Sig   atorvastatin (LIPITOR) 40 MG tablet TAKE 1 TABLET BY MOUTH EVERY DAY   hydrochlorothiazide (HYDRODIURIL) 12.5 MG tablet TAKE 1 TABLET BY MOUTH EVERY DAY   levothyroxine (SYNTHROID) 88 MCG tablet TAKE 1 TABLET BY MOUTH EVERY DAY IN THE MORNING   LINZESS 290 MCG CAPS capsule TAKE 1 CAPSULE (290 MCG TOTAL) BY MOUTH DAILY BEFORE BREAKFAST.   omeprazole (PRILOSEC) 40 MG capsule TAKE 1 CAPSULE (40 MG TOTAL) BY MOUTH DAILY.   [DISCONTINUED] FLUoxetine (PROZAC) 40 MG capsule TAKE 1 CAPSULE (40 MG TOTAL) BY MOUTH DAILY.   [DISCONTINUED] gabapentin (NEURONTIN) 600 MG tablet TAKE 1 TABLET BY MOUTH TWICE A DAY   FLUoxetine (PROZAC) 40 MG capsule Take 1 capsule (40 mg total) by mouth daily.   gabapentin (NEURONTIN) 600 MG tablet Take 1 tablet (600 mg total) by mouth 2 (two) times daily.   No facility-administered encounter medications on file as of 11/18/2022.    Surgical History: Past Surgical History:  Procedure Laterality Date   COLONOSCOPY WITH PROPOFOL N/A 08/14/2018   Procedure: COLONOSCOPY WITH PROPOFOL;  Surgeon: Jonathon Bellows, MD;  Location: [facility] ENDOSCOPY;  Service: Gastroenterology;  Laterality: N/A;    ESOPHAGOGASTRODUODENOSCOPY (EGD) WITH PROPOFOL N/A 08/14/2018   Procedure: ESOPHAGOGASTRODUODENOSCOPY (EGD) WITH PROPOFOL;  Surgeon: Jonathon Bellows, MD;  Location: [facility] ENDOSCOPY;  Service: Gastroenterology;  Laterality: N/A;   HEMORRHOID SURGERY      Medical History: Past Medical History:  Diagnosis Date   Addiction to drug ([place]) 04/06/2015   ANXIETY 03/11/2008   Qualifier: Diagnosis of  By: Hassell Done FNP, Nykedtra      Arthritis    BACK PAIN 02/17/2007   Qualifier: Diagnosis of  By: Silvio Pate MD, [facility] Cancer    Chronic pain    Colitis    self   DEPRESSION 11/05/2003   Qualifier: Diagnosis of  By: Hassell Done FNP, Nykedtra     GERD 12/29/2008   Qualifier: Diagnosis of  By: Hassell Done FNP, Tori Milks     H/O arthritis 02/20/2015   H/O: hypothyroidism 02/20/2015   HYPERCHOLESTEROLEMIA 03/11/2008   Qualifier: Diagnosis of  By: Hassell Done FNP, Nykedtra     Hypertension    PAIN IN JOINT [place]ELVIC REGION AND THIGH 03/11/2008   Qualifier: Diagnosis of  By: Hassell Done FNP, Nykedtra     Renal disorder    Sciatica    Thyroid disease    TOBACCO ABUSE 07/15/2008   Qualifier: Diagnosis of  By: Hassell Done FNP, Tori Milks      Family History: Family History  Problem Relation Age of Onset   Depression Mother    Hypertension Father    Heart failure Father    Diabetes Paternal Grandfather     Social History  Socioeconomic History   Marital status: Single    Spouse name: Not on file   Number of children: Not on file   Years of education: Not on file   Highest education level: Not on file  Occupational History   Not on file  Tobacco Use   Smoking status: Every Day    Packs/day: 0.50    Types: Cigarettes, E-cigarettes    Start date: 07/05/1990   Smokeless tobacco: Former   Tobacco comments:    Still using a vape daily  Vaping Use   Vaping Use: Every day  Substance and Sexual Activity   Alcohol use: Yes    Alcohol/week: 0.0 standard drinks of alcohol    Comment: ocassionally   Drug use: Yes    Frequency: 2.0 times per  week    Types: Marijuana   Sexual activity: Yes    Birth control/protection: Condom  Other Topics Concern   Not on file  Social History Narrative   Not on file   Social Determinants of Health   Financial Resource Strain: Not on file  Food Insecurity: Not on file  Transportation Needs: Not on file  Physical Activity: Not on file  Stress: Not on file  Social Connections: Not on file  Intimate Partner Violence: Not on file      Review of Systems  Constitutional:  Positive for fatigue. Negative for chills and unexpected weight change.  HENT:  Negative for congestion, postnasal drip, rhinorrhea, sneezing and sore throat.   Eyes:  Negative for redness.  Respiratory:  Negative for cough, chest tightness and shortness of breath.   Cardiovascular:  Negative for chest pain and palpitations.  Gastrointestinal:  Negative for abdominal pain, constipation, diarrhea, nausea and vomiting.  Genitourinary:  Negative for dysuria and frequency.  Musculoskeletal:  Positive for arthralgias. Negative for back pain, joint swelling and neck pain.  Skin:  Negative for rash.  Neurological:  Negative for tremors and numbness.  Hematological:  Negative for adenopathy. Does not bruise/bleed easily.  Psychiatric/Behavioral:  Positive for sleep disturbance. Negative for behavioral problems (Depression) and suicidal ideas. The patient is nervous/anxious.     Vital Signs: BP 129/86   Pulse 66   Temp 98.2 F (36.8 C)   Resp 16   Ht 6' (1.829 m)   Wt 180 lb 12.8 oz (82 kg)   SpO2 98%   BMI 24.52 kg/m    Physical Exam Vitals and nursing note reviewed.  Constitutional:      General: He is not in acute distress.    Appearance: He is well-developed and normal weight. He is not diaphoretic.  HENT:     Head: Normocephalic and atraumatic.     Mouth/Throat:     Pharynx: No oropharyngeal exudate.  Eyes:     Pupils: Pupils are equal, round, and reactive to light.  Neck:     Thyroid: No thyromegaly.      Vascular: No JVD.     Trachea: No tracheal deviation.  Cardiovascular:     Rate and Rhythm: Normal rate and regular rhythm.     Heart sounds: Normal heart sounds. No murmur heard.    No friction rub. No gallop.  Pulmonary:     Effort: Pulmonary effort is normal. No respiratory distress.     Breath sounds: No wheezing or rales.  Chest:     Chest wall: No tenderness.  Abdominal:     General: Bowel sounds are normal.     Palpations: Abdomen is soft.  Musculoskeletal:  General: Normal range of motion.     Cervical back: Normal range of motion and neck supple.  Lymphadenopathy:     Cervical: No cervical adenopathy.  Skin:    General: Skin is warm and dry.  Neurological:     Mental Status: He is alert and oriented to person, place, and time.     Cranial Nerves: No cranial nerve deficit.  Psychiatric:        Behavior: Behavior normal.        Thought Content: Thought content normal.        Judgment: Judgment normal.        Assessment/Plan: 1. Current moderate episode of major depressive disorder, unspecified whether recurrent (HCC) Stable, continue current medication - FLUoxetine (PROZAC) 40 MG capsule; Take 1 capsule (40 mg total) by mouth daily.  Dispense: 90 capsule; Refill: 1  2. Severe sleep apnea Will look into machine cost with new insurance  3. Essential hypertension, benign Stable, continues current medication  4. Other chronic pain - gabapentin (NEURONTIN) 600 MG tablet; Take 1 tablet (600 mg total) by mouth 2 (two) times daily.  Dispense: 180 tablet; Refill: 1   General Counseling: Jeremy Delgado understanding of the findings of todays visit and agrees with plan of treatment. I have discussed any further diagnostic evaluation that may be needed or ordered today. We also reviewed his medications today. he has been encouraged to call the office with any questions or concerns that should arise related to todays visit.    No orders of the defined  types were placed in this encounter.   Meds ordered this encounter  Medications   FLUoxetine (PROZAC) 40 MG capsule    Sig: Take 1 capsule (40 mg total) by mouth daily.    Dispense:  90 capsule    Refill:  1   gabapentin (NEURONTIN) 600 MG tablet    Sig: Take 1 tablet (600 mg total) by mouth 2 (two) times daily.    Dispense:  180 tablet    Refill:  1    This patient was seen by Drema Dallas, PA-C in collaboration with Dr. Clayborn Bigness as a part of collaborative care agreement.   Total time spent:30 Minutes Time spent includes review of chart, medications, test results, and follow up plan with the patient.      Dr Lavera Guise Internal medicine

## 2022-11-21 DIAGNOSIS — K080 Exfoliation of teeth due to systemic causes: Secondary | ICD-10-CM | POA: Diagnosis not present

## 2022-11-27 DIAGNOSIS — K080 Exfoliation of teeth due to systemic causes: Secondary | ICD-10-CM | POA: Diagnosis not present

## 2022-11-28 DIAGNOSIS — H52223 Regular astigmatism, bilateral: Secondary | ICD-10-CM | POA: Diagnosis not present

## 2022-12-05 DIAGNOSIS — K080 Exfoliation of teeth due to systemic causes: Secondary | ICD-10-CM | POA: Diagnosis not present

## 2023-01-06 DIAGNOSIS — K080 Exfoliation of teeth due to systemic causes: Secondary | ICD-10-CM | POA: Diagnosis not present

## 2023-01-22 ENCOUNTER — Other Ambulatory Visit: Payer: Self-pay | Admitting: Physician Assistant

## 2023-01-22 DIAGNOSIS — I10 Essential (primary) hypertension: Secondary | ICD-10-CM

## 2023-01-27 DIAGNOSIS — F1120 Opioid dependence, uncomplicated: Secondary | ICD-10-CM | POA: Diagnosis not present

## 2023-02-13 ENCOUNTER — Ambulatory Visit: Payer: Medicare Other | Admitting: Physician Assistant

## 2023-02-21 ENCOUNTER — Encounter: Payer: Self-pay | Admitting: Physician Assistant

## 2023-02-21 ENCOUNTER — Ambulatory Visit (INDEPENDENT_AMBULATORY_CARE_PROVIDER_SITE_OTHER): Payer: Medicare Other | Admitting: Physician Assistant

## 2023-02-21 VITALS — BP 124/74 | HR 69 | Temp 97.7°F | Resp 16 | Ht 72.0 in | Wt 191.0 lb

## 2023-02-21 DIAGNOSIS — R300 Dysuria: Secondary | ICD-10-CM | POA: Diagnosis not present

## 2023-02-21 DIAGNOSIS — F331 Major depressive disorder, recurrent, moderate: Secondary | ICD-10-CM | POA: Diagnosis not present

## 2023-02-21 DIAGNOSIS — E782 Mixed hyperlipidemia: Secondary | ICD-10-CM

## 2023-02-21 DIAGNOSIS — I10 Essential (primary) hypertension: Secondary | ICD-10-CM

## 2023-02-21 DIAGNOSIS — G4730 Sleep apnea, unspecified: Secondary | ICD-10-CM

## 2023-02-21 DIAGNOSIS — Z125 Encounter for screening for malignant neoplasm of prostate: Secondary | ICD-10-CM

## 2023-02-21 DIAGNOSIS — R5383 Other fatigue: Secondary | ICD-10-CM

## 2023-02-21 DIAGNOSIS — E559 Vitamin D deficiency, unspecified: Secondary | ICD-10-CM

## 2023-02-21 DIAGNOSIS — Z0001 Encounter for general adult medical examination with abnormal findings: Secondary | ICD-10-CM

## 2023-02-21 DIAGNOSIS — E039 Hypothyroidism, unspecified: Secondary | ICD-10-CM

## 2023-02-21 DIAGNOSIS — G8929 Other chronic pain: Secondary | ICD-10-CM

## 2023-02-21 MED ORDER — ZOSTER VAC RECOMB ADJUVANTED 50 MCG/0.5ML IM SUSR: 0.5000 mL | Freq: Once | INTRAMUSCULAR | 0 refills | Status: AC

## 2023-02-21 MED ORDER — TETANUS-DIPHTH-ACELL PERTUSSIS 5-2.5-18.5 LF-MCG/0.5 IM SUSY: 0.5000 mL | PREFILLED_SYRINGE | Freq: Once | INTRAMUSCULAR | 0 refills | Status: AC

## 2023-02-21 NOTE — Progress Notes (Signed)
[facility] [address] [place], [place] [address]  Internal MEDICINE  Office Visit Note  Patient Name: Jeremy Delgado  [id number]  [id number]  Date of Service: 02/21/2023  Chief Complaint  Patient presents with   Medicare Wellness   Depression   Gastroesophageal Reflux   Hypertension   Hyperlipidemia   Quality Metric Gaps    TDAP and Shingles     HPI Pt is here for routine health maintenance examination -BP is stable -Bipap still too costly, still working on this -Still going to the methadone clinic, had asked them to decrease his dose but is having more pain again and may need to go back up -Due for routine fasting labs -due for shingles and tdap vaccines and will have these done -UTD on colon screening  Current Medication: Outpatient Encounter Medications as of 02/21/2023  Medication Sig   atorvastatin (LIPITOR) 40 MG tablet TAKE 1 TABLET BY MOUTH EVERY DAY   FLUoxetine (PROZAC) 40 MG capsule Take 1 capsule (40 mg total) by mouth daily.   gabapentin (NEURONTIN) 600 MG tablet Take 1 tablet (600 mg total) by mouth 2 (two) times daily.   hydrochlorothiazide (HYDRODIURIL) 12.5 MG tablet TAKE 1 TABLET BY MOUTH EVERY DAY   levothyroxine (SYNTHROID) 88 MCG tablet TAKE 1 TABLET BY MOUTH EVERY DAY IN THE MORNING   LINZESS 290 MCG CAPS capsule TAKE 1 CAPSULE (290 MCG TOTAL) BY MOUTH DAILY BEFORE BREAKFAST.   omeprazole (PRILOSEC) 40 MG capsule TAKE 1 CAPSULE (40 MG TOTAL) BY MOUTH DAILY.   [DISCONTINUED] Tdap (BOOSTRIX) 5-2.5-18.5 LF-MCG/0.5 injection Inject 0.5 mLs into the muscle once.   [DISCONTINUED] Zoster Vaccine Adjuvanted [facility]) injection Inject 0.5 mLs into the muscle once.   Tdap (BOOSTRIX) 5-2.5-18.5 LF-MCG/0.5 injection Inject 0.5 mLs into the muscle once for 1 dose.   Zoster Vaccine Adjuvanted [facility]) injection Inject 0.5 mLs into the muscle once for 1 dose.   No facility-administered encounter medications on file as of 02/21/2023.     Surgical History: Past Surgical History:  Procedure Laterality Date   COLONOSCOPY WITH PROPOFOL N/A 08/14/2018   Procedure: COLONOSCOPY WITH PROPOFOL;  Surgeon: Wyline Mood, MD;  Location[facility] ENDOSCOPY;  Service: Gastroenterology;  Laterality: N/A;   ESOPHAGOGASTRODUODENOSCOPY (EGD) WITH PROPOFOL N/A 08/14/2018   Procedure: ESOPHAGOGASTRODUODENOSCOPY (EGD) WITH PROPOFOL;  Surgeon: Wyline Mood, MD;  Location: [facility] ENDOSCOPY;  Service: Gastroenterology;  Laterality: N/A;   HEMORRHOID SURGERY      Medical History: Past Medical History:  Diagnosis Date   Addiction to drug 04/06/2015   ANXIETY 03/11/2008   Qualifier: Diagnosis of  By: Daphine Deutscher FNP, Nykedtra      Arthritis    BACK PAIN 02/17/2007   Qualifier: Diagnosis of  By: Alphonsus Sias MD, Ronnette Hila    Chronic pain    Colitis    self   DEPRESSION 11/05/2003   Qualifier: Diagnosis of  By: Daphine Deutscher FNP, Nykedtra     GERD 12/29/2008   Qualifier: Diagnosis of  By: Daphine Deutscher FNP, Zena Amos     H/O arthritis 02/20/2015   H/O: hypothyroidism 02/20/2015   HYPERCHOLESTEROLEMIA 03/11/2008   Qualifier: Diagnosis of  By: Daphine Deutscher FNP, Nykedtra     Hypertension    PAIN IN JOINT PELVIC REGION AND THIGH 03/11/2008   Qualifier: Diagnosis of  By: Daphine Deutscher FNP, Nykedtra     Renal disorder    Sciatica    Thyroid disease    TOBACCO ABUSE 07/15/2008   Qualifier: Diagnosis of  By: Daphine Deutscher FNP, Zena Amos  Family History: Family History  Problem Relation Age of Onset   Depression Mother    Hypertension Father    Heart failure Father    Diabetes Paternal Grandfather       Review of Systems  Constitutional:  Positive for fatigue. Negative for chills and unexpected weight change.  HENT:  Negative for congestion, postnasal drip, rhinorrhea, sneezing and sore throat.   Eyes:  Negative for redness.  Respiratory:  Negative for cough, chest tightness and shortness of breath.   Cardiovascular:  Negative for chest pain and palpitations.  Gastrointestinal:  Negative  for abdominal pain, constipation, diarrhea, nausea and vomiting.  Genitourinary:  Negative for dysuria and frequency.  Musculoskeletal:  Positive for arthralgias. Negative for back pain, joint swelling and neck pain.  Skin:  Negative for rash.  Neurological:  Negative for tremors and numbness.  Hematological:  Negative for adenopathy. Does not bruise/bleed easily.  Psychiatric/Behavioral:  Positive for sleep disturbance. Negative for behavioral problems (Depression) and suicidal ideas. The patient is nervous/anxious.      Vital Signs: BP 124/74   Pulse 69   Temp 97.7 F (36.5 C)   Resp 16   Ht 6' (1.829 m)   Wt 191 lb (86.6 kg)   SpO2 96%   BMI 25.90 kg/m    Physical Exam Vitals and nursing note reviewed.  Constitutional:      General: He is not in acute distress.    Appearance: He is well-developed and normal weight. He is not diaphoretic.  HENT:     Head: Normocephalic and atraumatic.     Mouth/Throat:     Pharynx: No oropharyngeal exudate.  Eyes:     Pupils: Pupils are equal, round, and reactive to light.  Neck:     Thyroid: No thyromegaly.     Vascular: No JVD.     Trachea: No tracheal deviation.  Cardiovascular:     Rate and Rhythm: Normal rate and regular rhythm.     Heart sounds: Normal heart sounds. No murmur heard.    No friction rub. No gallop.  Pulmonary:     Effort: Pulmonary effort is normal. No respiratory distress.     Breath sounds: No wheezing or rales.  Chest:     Chest wall: No tenderness.  Abdominal:     General: Bowel sounds are normal.     Palpations: Abdomen is soft.     Tenderness: There is no abdominal tenderness.  Musculoskeletal:        General: Normal range of motion.     Cervical back: Normal range of motion and neck supple.  Lymphadenopathy:     Cervical: No cervical adenopathy.  Skin:    General: Skin is warm and dry.  Neurological:     Mental Status: He is alert and oriented to person, place, and time.     Cranial Nerves: No  cranial nerve deficit.  Psychiatric:        Behavior: Behavior normal.        Thought Content: Thought content normal.        Judgment: Judgment normal.      LABS: No results found for this or any previous visit (from the past 2160 hour(s)).      Assessment/Plan: 1. Encounter for general adult medical examination with abnormal findings CPE performed, routine labs ordered, due for vaccines  2. Essential hypertension, benign Well controlled, continue current medications  3. Moderate episode of recurrent major depressive disorder Continue prozac  4. Severe sleep apnea Working on  payment/coverage options for bipap SV  5. Other chronic pain Followed by methadone clinic  6. Mixed hyperlipidemia - Lipid Panel With LDL/HDL Ratio  7. Acquired hypothyroidism - TSH + free T4  8. Vitamin D deficiency - VITAMIN D 25 Hydroxy (Vit-D Deficiency, Fractures)  9. Special screening for malignant neoplasm of prostate - PSA Total (Reflex To Free)  10. Other fatigue - CBC w/Diff/Platelet - Comprehensive metabolic panel - TSH + free T4 - Lipid Panel With LDL/HDL Ratio - VITAMIN D 25 Hydroxy (Vit-D Deficiency, Fractures) - PSA Total (Reflex To Free)  11. Dysuria - UA/M w/rflx Culture, Routine   General Counseling: sabri teal understanding of the findings of todays visit and agrees with plan of treatment. I have discussed any further diagnostic evaluation that may be needed or ordered today. We also reviewed his medications today. he has been encouraged to call the office with any questions or concerns that should arise related to todays visit.    Counseling:    Orders Placed This Encounter  Procedures   UA/M w/rflx Culture, Routine   CBC w/Diff/Platelet   Comprehensive metabolic panel   TSH + free T4   Lipid Panel With LDL/HDL Ratio   VITAMIN D 25 Hydroxy (Vit-D Deficiency, Fractures)   PSA Total (Reflex To Free)    Meds ordered this encounter  Medications    Tdap (BOOSTRIX) 5-2.5-18.5 LF-MCG/0.5 injection    Sig: Inject 0.5 mLs into the muscle once for 1 dose.    Dispense:  0.5 mL    Refill:  0   Zoster Vaccine Adjuvanted [facility]) injection    Sig: Inject 0.5 mLs into the muscle once for 1 dose.    Dispense:  0.5 mL    Refill:  0    This patient was seen by Lynn Ito, PA-C in collaboration with Dr. Beverely Risen as a part of collaborative care agreement.  Total time spent:35 Minutes  Time spent includes review of chart, medications, test results, and follow up plan with the patient.     Lyndon Code, MD  Internal Medicine

## 2023-02-22 LAB — UA/M W/RFLX CULTURE, ROUTINE
Bilirubin, UA: NEGATIVE
Glucose, UA: NEGATIVE
Ketones, UA: NEGATIVE
Leukocytes,UA: NEGATIVE
Nitrite, UA: NEGATIVE
Protein,UA: NEGATIVE
RBC, UA: NEGATIVE
Specific Gravity, UA: 1.025 (ref 1.005–1.030)
Urobilinogen, Ur: 0.2 mg/dL (ref 0.2–1.0)
pH, UA: 5.5 (ref 5.0–7.5)

## 2023-02-22 LAB — MICROSCOPIC EXAMINATION
Bacteria, UA: NONE SEEN
Casts: NONE SEEN /lpf
Epithelial Cells (non renal): NONE SEEN /hpf (ref 0–10)
RBC, Urine: NONE SEEN /hpf (ref 0–2)
WBC, UA: NONE SEEN /hpf (ref 0–5)

## 2023-02-24 DIAGNOSIS — F1120 Opioid dependence, uncomplicated: Secondary | ICD-10-CM | POA: Diagnosis not present

## 2023-03-07 DIAGNOSIS — Z125 Encounter for screening for malignant neoplasm of prostate: Secondary | ICD-10-CM | POA: Diagnosis not present

## 2023-03-07 DIAGNOSIS — E782 Mixed hyperlipidemia: Secondary | ICD-10-CM | POA: Diagnosis not present

## 2023-03-07 DIAGNOSIS — R5383 Other fatigue: Secondary | ICD-10-CM | POA: Diagnosis not present

## 2023-03-07 DIAGNOSIS — E039 Hypothyroidism, unspecified: Secondary | ICD-10-CM | POA: Diagnosis not present

## 2023-03-07 DIAGNOSIS — E559 Vitamin D deficiency, unspecified: Secondary | ICD-10-CM | POA: Diagnosis not present

## 2023-03-08 LAB — CBC WITH DIFFERENTIAL/PLATELET
Basophils Absolute: 0 10*3/uL (ref 0.0–0.2)
Basos: 1 %
EOS (ABSOLUTE): 0.3 10*3/uL (ref 0.0–0.4)
Eos: 5 %
Hematocrit: 39.5 % (ref 37.5–51.0)
Hemoglobin: 13.2 g/dL (ref 13.0–17.7)
Immature Grans (Abs): 0 10*3/uL (ref 0.0–0.1)
Immature Granulocytes: 0 %
Lymphocytes Absolute: 1.9 10*3/uL (ref 0.7–3.1)
Lymphs: 36 %
MCH: 29.9 pg (ref 26.6–33.0)
MCHC: 33.4 g/dL (ref 31.5–35.7)
MCV: 89 fL (ref 79–97)
Monocytes Absolute: 0.6 10*3/uL (ref 0.1–0.9)
Monocytes: 12 %
Neutrophils Absolute: 2.5 10*3/uL (ref 1.4–7.0)
Neutrophils: 46 %
Platelets: 355 10*3/uL (ref 150–450)
RBC: 4.42 x10E6/uL (ref 4.14–5.80)
RDW: 12.4 % (ref 11.6–15.4)
WBC: 5.3 10*3/uL (ref 3.4–10.8)

## 2023-03-08 LAB — COMPREHENSIVE METABOLIC PANEL
ALT: 22 IU/L (ref 0–44)
AST: 19 IU/L (ref 0–40)
Albumin/Globulin Ratio: 1.8 (ref 1.2–2.2)
Albumin: 4.1 g/dL (ref 3.8–4.9)
Alkaline Phosphatase: 80 IU/L (ref 44–121)
BUN/Creatinine Ratio: 23 — ABNORMAL HIGH (ref 9–20)
BUN: 18 mg/dL (ref 6–24)
Bilirubin Total: 0.3 mg/dL (ref 0.0–1.2)
CO2: 28 mmol/L (ref 20–29)
Calcium: 9.2 mg/dL (ref 8.7–10.2)
Chloride: 101 mmol/L (ref 96–106)
Creatinine, Ser: 0.79 mg/dL (ref 0.76–1.27)
Globulin, Total: 2.3 g/dL (ref 1.5–4.5)
Glucose: 91 mg/dL (ref 70–99)
Potassium: 4.9 mmol/L (ref 3.5–5.2)
Sodium: 140 mmol/L (ref 134–144)
Total Protein: 6.4 g/dL (ref 6.0–8.5)
eGFR: 108 mL/min/{1.73_m2} (ref >59)

## 2023-03-08 LAB — LIPID PANEL WITH LDL/HDL RATIO
Cholesterol, Total: 163 mg/dL (ref 100–199)
HDL: 59 mg/dL (ref >39)
LDL Chol Calc (NIH): 84 mg/dL (ref 0–99)
LDL/HDL Ratio: 1.4 ratio (ref 0.0–3.6)
Triglycerides: 111 mg/dL (ref 0–149)
VLDL Cholesterol Cal: 20 mg/dL (ref 5–40)

## 2023-03-08 LAB — TSH+FREE T4
Free T4: 1.43 ng/dL (ref 0.82–1.77)
TSH: 4.22 u[IU]/mL (ref 0.450–4.500)

## 2023-03-08 LAB — VITAMIN D 25 HYDROXY (VIT D DEFICIENCY, FRACTURES): Vit D, 25-Hydroxy: 41.6 ng/mL (ref 30.0–100.0)

## 2023-03-08 LAB — PSA TOTAL (REFLEX TO FREE): Prostate Specific Ag, Serum: 0.8 ng/mL (ref 0.0–4.0)

## 2023-03-11 ENCOUNTER — Telehealth: Payer: Self-pay

## 2023-03-11 NOTE — Telephone Encounter (Signed)
-----   Message from Carlean Jews, PA-C sent at 03/10/2023  4:11 PM EDT ----- Please let him know that his labs looked good. Also it looks like his 3 month follow up for end of July  didn't get scheduled and remind him to do so

## 2023-03-11 NOTE — Telephone Encounter (Signed)
Spoke with patient regarding lab results and follow-up at the end of July.

## 2023-05-01 ENCOUNTER — Other Ambulatory Visit: Payer: Self-pay | Admitting: Physician Assistant

## 2023-05-01 DIAGNOSIS — K219 Gastro-esophageal reflux disease without esophagitis: Secondary | ICD-10-CM

## 2023-05-01 DIAGNOSIS — F321 Major depressive disorder, single episode, moderate: Secondary | ICD-10-CM

## 2023-05-02 ENCOUNTER — Other Ambulatory Visit: Payer: Self-pay | Admitting: Physician Assistant

## 2023-05-02 DIAGNOSIS — E782 Mixed hyperlipidemia: Secondary | ICD-10-CM

## 2023-05-02 DIAGNOSIS — E039 Hypothyroidism, unspecified: Secondary | ICD-10-CM

## 2023-05-19 DIAGNOSIS — F1120 Opioid dependence, uncomplicated: Secondary | ICD-10-CM | POA: Diagnosis not present

## 2023-05-26 DIAGNOSIS — F1120 Opioid dependence, uncomplicated: Secondary | ICD-10-CM | POA: Diagnosis not present

## 2023-06-02 ENCOUNTER — Encounter: Payer: Self-pay | Admitting: Physician Assistant

## 2023-06-02 ENCOUNTER — Ambulatory Visit (INDEPENDENT_AMBULATORY_CARE_PROVIDER_SITE_OTHER): Payer: Medicare Other | Admitting: Physician Assistant

## 2023-06-02 VITALS — BP 134/80 | HR 60 | Temp 97.8°F | Resp 16 | Ht 72.0 in | Wt 212.0 lb

## 2023-06-02 DIAGNOSIS — G4730 Sleep apnea, unspecified: Secondary | ICD-10-CM

## 2023-06-02 DIAGNOSIS — I10 Essential (primary) hypertension: Secondary | ICD-10-CM

## 2023-06-02 DIAGNOSIS — F331 Major depressive disorder, recurrent, moderate: Secondary | ICD-10-CM

## 2023-06-02 DIAGNOSIS — G8929 Other chronic pain: Secondary | ICD-10-CM

## 2023-06-02 MED ORDER — ZOSTER VAC RECOMB ADJUVANTED 50 MCG/0.5ML IM SUSR: 0.5000 mL | Freq: Once | INTRAMUSCULAR | 0 refills | Status: AC

## 2023-06-02 MED ORDER — HYDROCHLOROTHIAZIDE 12.5 MG PO TABS: 12.5000 mg | ORAL_TABLET | Freq: Every day | ORAL | 1 refills | Status: DC

## 2023-06-02 MED ORDER — TETANUS-DIPHTH-ACELL PERTUSSIS 5-2.5-18.5 LF-MCG/0.5 IM SUSP: 0.5000 mL | Freq: Once | INTRAMUSCULAR | 0 refills | Status: AC

## 2023-06-02 NOTE — Progress Notes (Signed)
[facility] [address] [place], [place] [address]  Internal MEDICINE  Office Visit Note  Patient Name: Jeremy Delgado  [id number]  [id number]  Date of Service: 06/12/2023  Chief Complaint  Patient presents with   Follow-up   Depression   Gastroesophageal Reflux   Hypertension   Hyperlipidemia    HPI Pt is here for routine follow up -grandson is now 52 yrs old -still looking into Bipap SV, is speaking with someone about helping with costs as he continues to have fatigue due to untreated OSA -otherwise doing well -BP stable -Continues to go to methadone clinic  Current Medication: Outpatient Encounter Medications as of 06/02/2023  Medication Sig   atorvastatin (LIPITOR) 40 MG tablet TAKE 1 TABLET BY MOUTH EVERY DAY   FLUoxetine (PROZAC) 40 MG capsule TAKE 1 CAPSULE (40 MG TOTAL) BY MOUTH DAILY.   gabapentin (NEURONTIN) 600 MG tablet Take 1 tablet (600 mg total) by mouth 2 (two) times daily.   levothyroxine (SYNTHROID) 88 MCG tablet TAKE 1 TABLET BY MOUTH EVERY DAY IN THE MORNING   LINZESS 290 MCG CAPS capsule TAKE 1 CAPSULE (290 MCG TOTAL) BY MOUTH DAILY BEFORE BREAKFAST.   omeprazole (PRILOSEC) 40 MG capsule TAKE 1 CAPSULE (40 MG TOTAL) BY MOUTH DAILY.   [DISCONTINUED] hydrochlorothiazide (HYDRODIURIL) 12.5 MG tablet TAKE 1 TABLET BY MOUTH EVERY DAY   [DISCONTINUED] Tdap (BOOSTRIX) 5-2.5-18.5 LF-MCG/0.5 injection Inject 0.5 mLs into the muscle once.   [DISCONTINUED] Zoster Vaccine Adjuvanted [facility]) injection Inject 0.5 mLs into the muscle once.   hydrochlorothiazide (HYDRODIURIL) 12.5 MG tablet Take 1 tablet (12.5 mg total) by mouth daily.   [EXPIRED] Tdap (BOOSTRIX) 5-2.5-18.5 LF-MCG/0.5 injection Inject 0.5 mLs into the muscle once for 1 dose.   [EXPIRED] Zoster Vaccine Adjuvanted [facility]) injection Inject 0.5 mLs into the muscle once for 1 dose.   No facility-administered encounter medications on file as of 06/02/2023.    Surgical History: Past  Surgical History:  Procedure Laterality Date   COLONOSCOPY WITH PROPOFOL N/A 08/14/2018   Procedure: COLONOSCOPY WITH PROPOFOL;  Surgeon: Wyline Mood, MD;  Location: [facility] ENDOSCOPY;  Service: Gastroenterology;  Laterality: N/A;   ESOPHAGOGASTRODUODENOSCOPY (EGD) WITH PROPOFOL N/A 08/14/2018   Procedure: ESOPHAGOGASTRODUODENOSCOPY (EGD) WITH PROPOFOL;  Surgeon: Wyline Mood, MD;  Location: [facility] ENDOSCOPY;  Service: Gastroenterology;  Laterality: N/A;   HEMORRHOID SURGERY      Medical History: Past Medical History:  Diagnosis Date   Addiction to drug (HCC) 04/06/2015   ANXIETY 03/11/2008   Qualifier: Diagnosis of  By: Daphine Deutscher FNP, Nykedtra      Arthritis    BACK PAIN 02/17/2007   Qualifier: Diagnosis of  By: Alphonsus Sias MD, Ronnette Hila    Chronic pain    Colitis    self   DEPRESSION 11/05/2003   Qualifier: Diagnosis of  By: Daphine Deutscher FNP, Nykedtra     GERD 12/29/2008   Qualifier: Diagnosis of  By: Daphine Deutscher FNP, Zena Amos     H/O arthritis 02/20/2015   H/O: hypothyroidism 02/20/2015   HYPERCHOLESTEROLEMIA 03/11/2008   Qualifier: Diagnosis of  By: Daphine Deutscher FNP, Nykedtra     Hypertension    PAIN IN JOINT PELVIC REGION AND THIGH 03/11/2008   Qualifier: Diagnosis of  By: Daphine Deutscher FNP, Nykedtra     Renal disorder    Sciatica    Thyroid disease    TOBACCO ABUSE 07/15/2008   Qualifier: Diagnosis of  By: Daphine Deutscher FNP, Zena Amos      Family History: Family History  Problem Relation Age of Onset  Depression Mother    Hypertension Father    Heart failure Father    Diabetes Paternal Grandfather     Social History   Socioeconomic History   Marital status: Single    Spouse name: Not on file   Number of children: Not on file   Years of education: Not on file   Highest education level: Not on file  Occupational History   Not on file  Tobacco Use   Smoking status: Every Day    Current packs/day: 0.50    Average packs/day: 0.5 packs/day for 32.9 years (16.5 ttl pk-yrs)    Types: Cigarettes, E-cigarettes     Start date: 07/05/1990   Smokeless tobacco: Former   Tobacco comments:    Still using a vape daily  Vaping Use   Vaping status: Every Day  Substance and Sexual Activity   Alcohol use: Yes    Alcohol/week: 0.0 standard drinks of alcohol    Comment: ocassionally   Drug use: Yes    Frequency: 2.0 times per week    Types: Marijuana   Sexual activity: Yes    Birth control/protection: Condom  Other Topics Concern   Not on file  Social History Narrative   Not on file   Social Determinants of Health   Financial Resource Strain: Not on file  Food Insecurity: Not on file  Transportation Needs: Not on file  Physical Activity: Not on file  Stress: Not on file  Social Connections: Not on file  Intimate Partner Violence: Not on file      Review of Systems  Constitutional:  Positive for fatigue. Negative for chills and unexpected weight change.  HENT:  Negative for congestion, postnasal drip, rhinorrhea, sneezing and sore throat.   Eyes:  Negative for redness.  Respiratory:  Negative for cough, chest tightness and shortness of breath.   Cardiovascular:  Negative for chest pain and palpitations.  Gastrointestinal:  Negative for abdominal pain, constipation, diarrhea, nausea and vomiting.  Genitourinary:  Negative for dysuria and frequency.  Musculoskeletal:  Positive for arthralgias. Negative for back pain, joint swelling and neck pain.  Skin:  Negative for rash.  Neurological:  Negative for tremors and numbness.  Hematological:  Negative for adenopathy. Does not bruise/bleed easily.  Psychiatric/Behavioral:  Positive for sleep disturbance. Negative for behavioral problems (Depression) and suicidal ideas. The patient is nervous/anxious.     Vital Signs: BP 134/80   Pulse 60   Temp 97.8 F (36.6 C)   Resp 16   Ht 6' (1.829 m)   Wt 212 lb (96.2 kg)   SpO2 97%   BMI 28.75 kg/m    Physical Exam Vitals and nursing note reviewed.  Constitutional:      General: He is not in  acute distress.    Appearance: He is well-developed and normal weight. He is not diaphoretic.  HENT:     Head: Normocephalic and atraumatic.     Mouth/Throat:     Pharynx: No oropharyngeal exudate.  Eyes:     Pupils: Pupils are equal, round, and reactive to light.  Neck:     Thyroid: No thyromegaly.     Vascular: No JVD.     Trachea: No tracheal deviation.  Cardiovascular:     Rate and Rhythm: Normal rate and regular rhythm.     Heart sounds: Normal heart sounds. No murmur heard.    No friction rub. No gallop.  Pulmonary:     Effort: Pulmonary effort is normal. No respiratory distress.  Breath sounds: No wheezing or rales.  Chest:     Chest wall: No tenderness.  Abdominal:     General: Bowel sounds are normal.     Palpations: Abdomen is soft.     Tenderness: There is no abdominal tenderness.  Musculoskeletal:        General: Normal range of motion.     Cervical back: Normal range of motion and neck supple.  Lymphadenopathy:     Cervical: No cervical adenopathy.  Skin:    General: Skin is warm and dry.  Neurological:     Mental Status: He is alert and oriented to person, place, and time.     Cranial Nerves: No cranial nerve deficit.  Psychiatric:        Behavior: Behavior normal.        Thought Content: Thought content normal.        Judgment: Judgment normal.        Assessment/Plan: 1. Essential hypertension, benign Continue current medications - hydrochlorothiazide (HYDRODIURIL) 12.5 MG tablet; Take 1 tablet (12.5 mg total) by mouth daily.  Dispense: 90 tablet; Refill: 1  2. Severe sleep apnea Still looking into bipap SV payment options  3. Moderate episode of recurrent major depressive disorder (HCC) Stable, continue current medication  4. Other chronic pain Followed by methadone clinic   General Counseling: espiridion claeys understanding of the findings of todays visit and agrees with plan of treatment. I have discussed any further diagnostic  evaluation that may be needed or ordered today. We also reviewed his medications today. he has been encouraged to call the office with any questions or concerns that should arise related to todays visit.    No orders of the defined types were placed in this encounter.   Meds ordered this encounter  Medications   Tdap (BOOSTRIX) 5-2.5-18.5 LF-MCG/0.5 injection    Sig: Inject 0.5 mLs into the muscle once for 1 dose.    Dispense:  0.5 mL    Refill:  0   Zoster Vaccine Adjuvanted [facility]) injection    Sig: Inject 0.5 mLs into the muscle once for 1 dose.    Dispense:  0.5 mL    Refill:  0   hydrochlorothiazide (HYDRODIURIL) 12.5 MG tablet    Sig: Take 1 tablet (12.5 mg total) by mouth daily.    Dispense:  90 tablet    Refill:  1    This patient was seen by Lynn Ito, PA-C in collaboration with Dr. Beverely Risen as a part of collaborative care agreement.   Total time spent:30 Minutes Time spent includes review of chart, medications, test results, and follow up plan with the patient.      Dr Lyndon Code Internal medicine

## 2023-06-04 DIAGNOSIS — K080 Exfoliation of teeth due to systemic causes: Secondary | ICD-10-CM | POA: Diagnosis not present

## 2023-06-09 DIAGNOSIS — F1120 Opioid dependence, uncomplicated: Secondary | ICD-10-CM | POA: Diagnosis not present

## 2023-06-16 DIAGNOSIS — F1120 Opioid dependence, uncomplicated: Secondary | ICD-10-CM | POA: Diagnosis not present

## 2023-07-07 DIAGNOSIS — F1120 Opioid dependence, uncomplicated: Secondary | ICD-10-CM | POA: Diagnosis not present

## 2023-07-08 DIAGNOSIS — F1120 Opioid dependence, uncomplicated: Secondary | ICD-10-CM | POA: Diagnosis not present

## 2023-07-14 DIAGNOSIS — F1120 Opioid dependence, uncomplicated: Secondary | ICD-10-CM | POA: Diagnosis not present

## 2023-08-04 DIAGNOSIS — F1120 Opioid dependence, uncomplicated: Secondary | ICD-10-CM | POA: Diagnosis not present

## 2023-08-11 DIAGNOSIS — F1120 Opioid dependence, uncomplicated: Secondary | ICD-10-CM | POA: Diagnosis not present

## 2023-09-01 DIAGNOSIS — F1120 Opioid dependence, uncomplicated: Secondary | ICD-10-CM | POA: Diagnosis not present

## 2023-09-04 ENCOUNTER — Ambulatory Visit: Payer: Medicare Other | Admitting: Physician Assistant

## 2023-09-08 DIAGNOSIS — F1120 Opioid dependence, uncomplicated: Secondary | ICD-10-CM | POA: Diagnosis not present

## 2023-09-19 ENCOUNTER — Ambulatory Visit (INDEPENDENT_AMBULATORY_CARE_PROVIDER_SITE_OTHER): Payer: Medicare Other | Admitting: Physician Assistant

## 2023-09-19 ENCOUNTER — Encounter: Payer: Self-pay | Admitting: Physician Assistant

## 2023-09-19 VITALS — BP 123/80 | HR 65 | Temp 98.1°F | Resp 16 | Ht 72.0 in | Wt 212.0 lb

## 2023-09-19 DIAGNOSIS — I10 Essential (primary) hypertension: Secondary | ICD-10-CM

## 2023-09-19 DIAGNOSIS — K219 Gastro-esophageal reflux disease without esophagitis: Secondary | ICD-10-CM

## 2023-09-19 DIAGNOSIS — E782 Mixed hyperlipidemia: Secondary | ICD-10-CM | POA: Diagnosis not present

## 2023-09-19 DIAGNOSIS — E039 Hypothyroidism, unspecified: Secondary | ICD-10-CM | POA: Diagnosis not present

## 2023-09-19 MED ORDER — OMEPRAZOLE 40 MG PO CPDR: 40.0000 mg | DELAYED_RELEASE_CAPSULE | Freq: Every day | ORAL | 1 refills | Status: DC

## 2023-09-19 MED ORDER — LEVOTHYROXINE SODIUM 88 MCG PO TABS: ORAL_TABLET | ORAL | 1 refills | Status: DC

## 2023-09-19 MED ORDER — ATORVASTATIN CALCIUM 40 MG PO TABS: 40.0000 mg | ORAL_TABLET | Freq: Every day | ORAL | 1 refills | Status: DC

## 2023-09-19 NOTE — Progress Notes (Signed)
[facility] [address] [place], [place] [address]  Internal MEDICINE  Office Visit Note  Patient Name: Jeremy Delgado  [id number]  [id number]  Date of Service: 09/29/2023  Chief Complaint  Patient presents with   Follow-up   Depression   Gastroesophageal Reflux   Hypertension   Hyperlipidemia    HPI Pt is here for routine follow up -Needs some future refills -Unfortunately still unable to get bipap ASV due to cost -Tired today, but also has not eaten today and thinks this is the reason why and plans to eat now -BP stable  Current Medication: Outpatient Encounter Medications as of 09/19/2023  Medication Sig   FLUoxetine (PROZAC) 40 MG capsule TAKE 1 CAPSULE (40 MG TOTAL) BY MOUTH DAILY.   gabapentin (NEURONTIN) 600 MG tablet Take 1 tablet (600 mg total) by mouth 2 (two) times daily.   hydrochlorothiazide (HYDRODIURIL) 12.5 MG tablet Take 1 tablet (12.5 mg total) by mouth daily.   LINZESS 290 MCG CAPS capsule TAKE 1 CAPSULE (290 MCG TOTAL) BY MOUTH DAILY BEFORE BREAKFAST.   [DISCONTINUED] atorvastatin (LIPITOR) 40 MG tablet TAKE 1 TABLET BY MOUTH EVERY DAY   [DISCONTINUED] levothyroxine (SYNTHROID) 88 MCG tablet TAKE 1 TABLET BY MOUTH EVERY DAY IN THE MORNING   [DISCONTINUED] omeprazole (PRILOSEC) 40 MG capsule TAKE 1 CAPSULE (40 MG TOTAL) BY MOUTH DAILY.   atorvastatin (LIPITOR) 40 MG tablet Take 1 tablet (40 mg total) by mouth daily.   levothyroxine (SYNTHROID) 88 MCG tablet TAKE 1 TABLET BY MOUTH EVERY DAY IN THE MORNING   omeprazole (PRILOSEC) 40 MG capsule Take 1 capsule (40 mg total) by mouth daily.   No facility-administered encounter medications on file as of 09/19/2023.    Surgical History: Past Surgical History:  Procedure Laterality Date   COLONOSCOPY WITH PROPOFOL N/A 08/14/2018   Procedure: COLONOSCOPY WITH PROPOFOL;  Surgeon: Wyline Mood, MD;  Location: [facility] ENDOSCOPY;  Service: Gastroenterology;  Laterality: N/A;    ESOPHAGOGASTRODUODENOSCOPY (EGD) WITH PROPOFOL N/A 08/14/2018   Procedure: ESOPHAGOGASTRODUODENOSCOPY (EGD) WITH PROPOFOL;  Surgeon: Wyline Mood, MD;  Location: [facility] ENDOSCOPY;  Service: Gastroenterology;  Laterality: N/A;   HEMORRHOID SURGERY      Medical History: Past Medical History:  Diagnosis Date   Addiction to drug (HCC) 04/06/2015   ANXIETY 03/11/2008   Qualifier: Diagnosis of  By: Daphine Deutscher FNP, Nykedtra      Arthritis    BACK PAIN 02/17/2007   Qualifier: Diagnosis of  By: Alphonsus Sias MD, Ronnette Hila    Chronic pain    Colitis    self   DEPRESSION 11/05/2003   Qualifier: Diagnosis of  By: Daphine Deutscher FNP, Nykedtra     GERD 12/29/2008   Qualifier: Diagnosis of  By: Daphine Deutscher FNP, Zena Amos     H/O arthritis 02/20/2015   H/O: hypothyroidism 02/20/2015   HYPERCHOLESTEROLEMIA 03/11/2008   Qualifier: Diagnosis of  By: Daphine Deutscher FNP, Nykedtra     Hypertension    PAIN IN JOINT PELVIC REGION AND THIGH 03/11/2008   Qualifier: Diagnosis of  By: Daphine Deutscher FNP, Nykedtra     Renal disorder    Sciatica    Thyroid disease    TOBACCO ABUSE 07/15/2008   Qualifier: Diagnosis of  By: Daphine Deutscher FNP, Zena Amos      Family History: Family History  Problem Relation Age of Onset   Depression Mother    Hypertension Father    Heart failure Father    Diabetes Paternal Grandfather     Social History   Socioeconomic History   Marital  status: Single    Spouse name: Not on file   Number of children: Not on file   Years of education: Not on file   Highest education level: Not on file  Occupational History   Not on file  Tobacco Use   Smoking status: Every Day    Current packs/day: 0.50    Average packs/day: 0.5 packs/day for 33.2 years (16.6 ttl pk-yrs)    Types: Cigarettes, E-cigarettes    Start date: 07/05/1990   Smokeless tobacco: Former   Tobacco comments:    Still using a vape daily  Vaping Use   Vaping status: Every Day  Substance and Sexual Activity   Alcohol use: Yes    Alcohol/week: 0.0 standard drinks of  alcohol    Comment: ocassionally   Drug use: Yes    Frequency: 2.0 times per week    Types: Marijuana   Sexual activity: Yes    Birth control/protection: Condom  Other Topics Concern   Not on file  Social History Narrative   Not on file   Social Determinants of Health   Financial Resource Strain: Not on file  Food Insecurity: Not on file  Transportation Needs: Not on file  Physical Activity: Not on file  Stress: Not on file  Social Connections: Not on file  Intimate Partner Violence: Not on file      Review of Systems  Constitutional:  Positive for fatigue. Negative for chills and unexpected weight change.  HENT:  Negative for congestion, postnasal drip, rhinorrhea, sneezing and sore throat.   Eyes:  Negative for redness.  Respiratory:  Negative for cough, chest tightness and shortness of breath.   Cardiovascular:  Negative for chest pain and palpitations.  Gastrointestinal:  Negative for abdominal pain, constipation, diarrhea, nausea and vomiting.  Genitourinary:  Negative for dysuria and frequency.  Musculoskeletal:  Positive for arthralgias. Negative for back pain, joint swelling and neck pain.  Skin:  Negative for rash.  Neurological:  Negative for tremors and numbness.  Hematological:  Negative for adenopathy. Does not bruise/bleed easily.  Psychiatric/Behavioral:  Positive for sleep disturbance. Negative for behavioral problems (Depression) and suicidal ideas. The patient is nervous/anxious.     Vital Signs: BP 123/80   Pulse 65   Temp 98.1 F (36.7 C)   Resp 16   Ht 6' (1.829 m)   Wt 212 lb (96.2 kg)   SpO2 96%   BMI 28.75 kg/m    Physical Exam Vitals and nursing note reviewed.  Constitutional:      General: He is not in acute distress.    Appearance: He is well-developed and normal weight. He is not diaphoretic.  HENT:     Head: Normocephalic and atraumatic.     Mouth/Throat:     Pharynx: No oropharyngeal exudate.  Eyes:     Pupils: Pupils are  equal, round, and reactive to light.  Neck:     Thyroid: No thyromegaly.     Vascular: No JVD.     Trachea: No tracheal deviation.  Cardiovascular:     Rate and Rhythm: Normal rate and regular rhythm.     Heart sounds: Normal heart sounds. No murmur heard.    No friction rub. No gallop.  Pulmonary:     Effort: Pulmonary effort is normal. No respiratory distress.     Breath sounds: No wheezing or rales.  Chest:     Chest wall: No tenderness.  Abdominal:     General: Bowel sounds are normal.  Palpations: Abdomen is soft.     Tenderness: There is no abdominal tenderness.  Musculoskeletal:        General: Normal range of motion.     Cervical back: Normal range of motion and neck supple.  Lymphadenopathy:     Cervical: No cervical adenopathy.  Skin:    General: Skin is warm and dry.  Neurological:     Mental Status: He is alert and oriented to person, place, and time.     Cranial Nerves: No cranial nerve deficit.  Psychiatric:        Behavior: Behavior normal.        Thought Content: Thought content normal.        Judgment: Judgment normal.        Assessment/Plan: 1. Essential hypertension, benign Stable, continue current medication  2. Gastroesophageal reflux disease without esophagitis - omeprazole (PRILOSEC) 40 MG capsule; Take 1 capsule (40 mg total) by mouth daily.  Dispense: 90 capsule; Refill: 1  3. Acquired hypothyroidism - levothyroxine (SYNTHROID) 88 MCG tablet; TAKE 1 TABLET BY MOUTH EVERY DAY IN THE MORNING  Dispense: 90 tablet; Refill: 1  4. Mixed hyperlipidemia - atorvastatin (LIPITOR) 40 MG tablet; Take 1 tablet (40 mg total) by mouth daily.  Dispense: 90 tablet; Refill: 1   General Counseling: Jeremy Delgado understanding of the findings of todays visit and agrees with plan of treatment. I have discussed any further diagnostic evaluation that may be needed or ordered today. We also reviewed his medications today. he has been encouraged to call the  office with any questions or concerns that should arise related to todays visit.    No orders of the defined types were placed in this encounter.   Meds ordered this encounter  Medications   omeprazole (PRILOSEC) 40 MG capsule    Sig: Take 1 capsule (40 mg total) by mouth daily.    Dispense:  90 capsule    Refill:  1   levothyroxine (SYNTHROID) 88 MCG tablet    Sig: TAKE 1 TABLET BY MOUTH EVERY DAY IN THE MORNING    Dispense:  90 tablet    Refill:  1   atorvastatin (LIPITOR) 40 MG tablet    Sig: Take 1 tablet (40 mg total) by mouth daily.    Dispense:  90 tablet    Refill:  1    This patient was seen by Lynn Ito, PA-C in collaboration with Dr. Beverely Risen as a part of collaborative care agreement.   Total time spent:30 Minutes Time spent includes review of chart, medications, test results, and follow up plan with the patient.      Dr Lyndon Code Internal medicine

## 2023-09-29 DIAGNOSIS — F1120 Opioid dependence, uncomplicated: Secondary | ICD-10-CM | POA: Diagnosis not present

## 2023-10-06 DIAGNOSIS — F1120 Opioid dependence, uncomplicated: Secondary | ICD-10-CM | POA: Diagnosis not present

## 2023-10-27 DIAGNOSIS — F1120 Opioid dependence, uncomplicated: Secondary | ICD-10-CM | POA: Diagnosis not present

## 2023-10-29 ENCOUNTER — Other Ambulatory Visit: Payer: Self-pay | Admitting: Physician Assistant

## 2023-10-29 DIAGNOSIS — E039 Hypothyroidism, unspecified: Secondary | ICD-10-CM

## 2023-10-29 DIAGNOSIS — F321 Major depressive disorder, single episode, moderate: Secondary | ICD-10-CM

## 2023-11-03 DIAGNOSIS — F1120 Opioid dependence, uncomplicated: Secondary | ICD-10-CM | POA: Diagnosis not present

## 2023-11-24 DIAGNOSIS — F1120 Opioid dependence, uncomplicated: Secondary | ICD-10-CM | POA: Diagnosis not present

## 2023-12-01 DIAGNOSIS — F1120 Opioid dependence, uncomplicated: Secondary | ICD-10-CM | POA: Diagnosis not present

## 2023-12-14 ENCOUNTER — Other Ambulatory Visit: Payer: Self-pay | Admitting: Physician Assistant

## 2023-12-14 DIAGNOSIS — I10 Essential (primary) hypertension: Secondary | ICD-10-CM

## 2023-12-17 DIAGNOSIS — Z83518 Family history of other specified eye disorder: Secondary | ICD-10-CM | POA: Diagnosis not present

## 2023-12-17 DIAGNOSIS — H524 Presbyopia: Secondary | ICD-10-CM | POA: Diagnosis not present

## 2023-12-17 DIAGNOSIS — H5213 Myopia, bilateral: Secondary | ICD-10-CM | POA: Diagnosis not present

## 2023-12-17 DIAGNOSIS — H0259 Other disorders affecting eyelid function: Secondary | ICD-10-CM | POA: Diagnosis not present

## 2023-12-19 ENCOUNTER — Ambulatory Visit: Payer: Medicare Other | Admitting: Physician Assistant

## 2023-12-22 DIAGNOSIS — F1120 Opioid dependence, uncomplicated: Secondary | ICD-10-CM | POA: Diagnosis not present

## 2023-12-25 ENCOUNTER — Encounter: Payer: Self-pay | Admitting: Physician Assistant

## 2023-12-25 ENCOUNTER — Telehealth (INDEPENDENT_AMBULATORY_CARE_PROVIDER_SITE_OTHER): Payer: Medicare Other | Admitting: Physician Assistant

## 2023-12-25 DIAGNOSIS — M5030 Other cervical disc degeneration, unspecified cervical region: Secondary | ICD-10-CM | POA: Diagnosis not present

## 2023-12-25 DIAGNOSIS — R200 Anesthesia of skin: Secondary | ICD-10-CM

## 2023-12-25 DIAGNOSIS — I10 Essential (primary) hypertension: Secondary | ICD-10-CM | POA: Diagnosis not present

## 2023-12-25 NOTE — Progress Notes (Signed)
[facility] [address] [place], [place] [address]  Internal MEDICINE  Telephone Visit  Patient Name: Jeremy Delgado  [id number]  [id number]  Date of Service: 12/25/2023  I connected with the patient at 10:54 by telephone and verified the patients identity using two identifiers.   I discussed the limitations, risks, security and privacy concerns of performing an evaluation and management service by telephone and the availability of in person appointments. I also discussed with the patient that there may be a patient responsible charge related to the service.  The patient expressed understanding and agrees to proceed.    Chief Complaint  Patient presents with   Telephone Screen    Follow up   Telephone Assessment    Telephone visit  [phone]     Hypertension    HPI Pt is here for virtual follow up due to weather -BP stable -collar bone on left side and neck has some numbness, for the last week or so. Hx of pinched nerves and thinks this could be the case. -has had nerve conduction studies previously and this is when he was told about pinched nerves previously -No dizziness, no new headaches, no vision changes, no pain, no rashes, no swelling, no skin changes, -normal strength in hand/arm, He has full ROM of neck -Taking anti-inflammatories -Discussed limitations of virtual visit today, and inability to perform a physical exam. Discussed other symptoms to monitor for and if any new or worsening symptoms to go to ED  Current Medication: Outpatient Encounter Medications as of 12/25/2023  Medication Sig   atorvastatin (LIPITOR) 40 MG tablet Take 1 tablet (40 mg total) by mouth daily.   FLUoxetine (PROZAC) 40 MG capsule TAKE 1 CAPSULE (40 MG TOTAL) BY MOUTH DAILY.   gabapentin (NEURONTIN) 600 MG tablet Take 1 tablet (600 mg total) by mouth 2 (two) times daily.   hydrochlorothiazide (HYDRODIURIL) 12.5 MG tablet TAKE 1 TABLET BY MOUTH EVERY DAY   levothyroxine  (SYNTHROID) 88 MCG tablet TAKE 1 TABLET BY MOUTH EVERY DAY IN THE MORNING   LINZESS 290 MCG CAPS capsule TAKE 1 CAPSULE (290 MCG TOTAL) BY MOUTH DAILY BEFORE BREAKFAST.   omeprazole (PRILOSEC) 40 MG capsule Take 1 capsule (40 mg total) by mouth daily.   No facility-administered encounter medications on file as of 12/25/2023.    Surgical History: Past Surgical History:  Procedure Laterality Date   COLONOSCOPY WITH PROPOFOL N/A 08/14/2018   Procedure: COLONOSCOPY WITH PROPOFOL;  Surgeon: Wyline Mood, MD;  Location: [facility] ENDOSCOPY;  Service: Gastroenterology;  Laterality: N/A;   ESOPHAGOGASTRODUODENOSCOPY (EGD) WITH PROPOFOL N/A 08/14/2018   Procedure: ESOPHAGOGASTRODUODENOSCOPY (EGD) WITH PROPOFOL;  Surgeon: Wyline Mood, MD;  Location: [facility] ENDOSCOPY;  Service: Gastroenterology;  Laterality: N/A;   HEMORRHOID SURGERY      Medical History: Past Medical History:  Diagnosis Date   Addiction to drug (HCC) 04/06/2015   ANXIETY 03/11/2008   Qualifier: Diagnosis of  By: Daphine Deutscher FNP, Nykedtra      Arthritis    BACK PAIN 02/17/2007   Qualifier: Diagnosis of  By: Alphonsus Sias MD, Ronnette Hila    Chronic pain    Colitis    self   DEPRESSION 11/05/2003   Qualifier: Diagnosis of  By: Daphine Deutscher FNP, Nykedtra     GERD 12/29/2008   Qualifier: Diagnosis of  By: Daphine Deutscher FNP, Zena Amos     H/O arthritis 02/20/2015   H/O: hypothyroidism 02/20/2015   HYPERCHOLESTEROLEMIA 03/11/2008   Qualifier: Diagnosis of  By: Daphine Deutscher FNP, Zena Amos  Hypertension    PAIN IN JOINT PELVIC REGION AND THIGH 03/11/2008   Qualifier: Diagnosis of  By: Daphine Deutscher FNP, Nykedtra     Renal disorder    Sciatica    Thyroid disease    TOBACCO ABUSE 07/15/2008   Qualifier: Diagnosis of  By: Daphine Deutscher FNP, Zena Amos      Family History: Family History  Problem Relation Age of Onset   Depression Mother    Hypertension Father    Heart failure Father    Diabetes Paternal Grandfather     Social History   Socioeconomic History   Marital status: Single     Spouse name: Not on file   Number of children: Not on file   Years of education: Not on file   Highest education level: Not on file  Occupational History   Not on file  Tobacco Use   Smoking status: Every Day    Current packs/day: 0.50    Average packs/day: 0.5 packs/day for 33.5 years (16.7 ttl pk-yrs)    Types: Cigarettes, E-cigarettes    Start date: 07/05/1990   Smokeless tobacco: Former   Tobacco comments:    Still using a vape daily  Vaping Use   Vaping status: Every Day  Substance and Sexual Activity   Alcohol use: Yes    Alcohol/week: 0.0 standard drinks of alcohol    Comment: ocassionally   Drug use: Yes    Frequency: 2.0 times per week    Types: Marijuana   Sexual activity: Yes    Birth control/protection: Condom  Other Topics Concern   Not on file  Social History Narrative   Not on file   Social Drivers of Health   Financial Resource Strain: Not on file  Food Insecurity: Not on file  Transportation Needs: Not on file  Physical Activity: Not on file  Stress: Not on file  Social Connections: Not on file  Intimate Partner Violence: Not on file      Review of Systems  Constitutional:  Positive for fatigue. Negative for chills and unexpected weight change.  HENT:  Negative for congestion, postnasal drip, rhinorrhea, sneezing and sore throat.   Eyes:  Negative for redness.  Respiratory:  Negative for cough, chest tightness and shortness of breath.   Cardiovascular:  Negative for chest pain and palpitations.  Gastrointestinal:  Negative for abdominal pain, constipation, diarrhea, nausea and vomiting.  Genitourinary:  Negative for dysuria and frequency.  Musculoskeletal:  Positive for arthralgias. Negative for back pain, joint swelling, neck pain and neck stiffness.  Skin:  Negative for color change and rash.  Neurological:  Positive for numbness. Negative for tremors.       Numbness along left side collarbone  Hematological:  Negative for adenopathy. Does  not bruise/bleed easily.  Psychiatric/Behavioral:  Positive for sleep disturbance. Negative for behavioral problems (Depression) and suicidal ideas. The patient is nervous/anxious.     Vital Signs: There were no vitals taken for this visit.   Observation/Objective:  Pt is able to carry out conversation   Assessment/Plan: 1. Numbness in cervical dermatome distribution (Primary) Will start with xrays, pt taking NSAIDs, advised to monitor closely for any new or worsening symptoms and to go to ED if so. Will also refer to neurology. Pt has hx of pinched nerves and cervical degeneration - Ambulatory referral to Neurology - DG Cervical Spine Complete; Future - DG Clavicle Left; Future  2. Disc disease, degenerative, cervical - DG Cervical Spine Complete; Future - DG Clavicle Left; Future  3. Essential  hypertension, benign Stable, continue current medication   General Counseling: keedan sample understanding of the findings of today's phone visit and agrees with plan of treatment. I have discussed any further diagnostic evaluation that may be needed or ordered today. We also reviewed his medications today. he has been encouraged to call the office with any questions or concerns that should arise related to todays visit.    Orders Placed This Encounter  Procedures   DG Cervical Spine Complete   DG Clavicle Left   Ambulatory referral to Neurology    No orders of the defined types were placed in this encounter.   Time spent:30 Minutes    Dr Lyndon Code Internal medicine

## 2023-12-29 ENCOUNTER — Telehealth: Payer: Self-pay | Admitting: Physician Assistant

## 2023-12-29 DIAGNOSIS — F1120 Opioid dependence, uncomplicated: Secondary | ICD-10-CM | POA: Diagnosis not present

## 2023-12-29 NOTE — Telephone Encounter (Signed)
 Urgent Neurology referral sent via Epic to [facility]. Notified patient. Gave pt telephone # [phone]

## 2024-01-02 DIAGNOSIS — M5412 Radiculopathy, cervical region: Secondary | ICD-10-CM | POA: Diagnosis not present

## 2024-01-07 ENCOUNTER — Telehealth: Payer: Self-pay | Admitting: Physician Assistant

## 2024-01-07 NOTE — Telephone Encounter (Signed)
 Neurology appointment 03/17/2024 @ [facility]

## 2024-01-12 DIAGNOSIS — K080 Exfoliation of teeth due to systemic causes: Secondary | ICD-10-CM | POA: Diagnosis not present

## 2024-01-19 DIAGNOSIS — F1120 Opioid dependence, uncomplicated: Secondary | ICD-10-CM | POA: Diagnosis not present

## 2024-01-20 DIAGNOSIS — F1120 Opioid dependence, uncomplicated: Secondary | ICD-10-CM | POA: Diagnosis not present

## 2024-01-20 DIAGNOSIS — M5412 Radiculopathy, cervical region: Secondary | ICD-10-CM | POA: Diagnosis not present

## 2024-01-23 DIAGNOSIS — M5412 Radiculopathy, cervical region: Secondary | ICD-10-CM | POA: Diagnosis not present

## 2024-01-26 DIAGNOSIS — F1120 Opioid dependence, uncomplicated: Secondary | ICD-10-CM | POA: Diagnosis not present

## 2024-02-06 ENCOUNTER — Other Ambulatory Visit: Payer: Self-pay

## 2024-02-06 DIAGNOSIS — G8929 Other chronic pain: Secondary | ICD-10-CM

## 2024-02-06 MED ORDER — GABAPENTIN 600 MG PO TABS: 600.0000 mg | ORAL_TABLET | Freq: Two times a day (BID) | ORAL | 1 refills | Status: DC

## 2024-02-16 DIAGNOSIS — F1120 Opioid dependence, uncomplicated: Secondary | ICD-10-CM | POA: Diagnosis not present

## 2024-02-27 ENCOUNTER — Ambulatory Visit: Payer: Medicare Other | Admitting: Physician Assistant

## 2024-03-01 ENCOUNTER — Encounter: Payer: Self-pay | Admitting: Physician Assistant

## 2024-03-01 ENCOUNTER — Telehealth: Payer: Self-pay | Admitting: Physician Assistant

## 2024-03-01 ENCOUNTER — Ambulatory Visit (INDEPENDENT_AMBULATORY_CARE_PROVIDER_SITE_OTHER): Payer: Medicare Other | Admitting: Physician Assistant

## 2024-03-01 VITALS — BP 115/71 | HR 62 | Temp 97.8°F | Resp 16 | Ht 72.0 in | Wt 215.0 lb

## 2024-03-01 DIAGNOSIS — Z0000 Encounter for general adult medical examination without abnormal findings: Secondary | ICD-10-CM

## 2024-03-01 DIAGNOSIS — R0789 Other chest pain: Secondary | ICD-10-CM | POA: Diagnosis not present

## 2024-03-01 DIAGNOSIS — E782 Mixed hyperlipidemia: Secondary | ICD-10-CM

## 2024-03-01 DIAGNOSIS — I10 Essential (primary) hypertension: Secondary | ICD-10-CM

## 2024-03-01 DIAGNOSIS — I6523 Occlusion and stenosis of bilateral carotid arteries: Secondary | ICD-10-CM

## 2024-03-01 DIAGNOSIS — E039 Hypothyroidism, unspecified: Secondary | ICD-10-CM

## 2024-03-01 MED ORDER — CLOBETASOL PROPIONATE 0.05 % EX OINT: 1.0000 | TOPICAL_OINTMENT | Freq: Every day | CUTANEOUS | 0 refills | Status: AC | PRN

## 2024-03-01 NOTE — Progress Notes (Signed)
 [facility] [address] [place], [place] [address]  Internal MEDICINE  Office Visit Note  Patient Name: Jeremy Delgado  [id number]  [id number]  Date of Service: 03/01/2024  Chief Complaint  Patient presents with   Medicare Wellness   Hypertension   Hyperlipidemia    HPI Jeremy Delgado presents for an annual well visit and physical exam.  Well-appearing 53 y.o. male Routine CRC screening: UTD, due in 2029 Labs: lab slip given Other concerns: uses clobetosol ointment as needed for eczema and needs refill  -BP stable -tdap updated a few days ago -numbness in cervical/clavicle region seems to be improved. Went to [facility] and says he had a MRI and had a few pinched nerves which may have contributed. States grandson does like to crawl around neck and recently hasn't let him and may correlate as well. -Does have occasional pain along left side of chest. Had it briefly this morning. Unclear if related to sleep position or neck. Does not radiate anywhere and no SOB or palpitations. Occasional light headed feeling when turning head all the way to right as well. Will go ahead and update echo and carotid US . Advised if any new or worsening symptoms to go to ED     03/01/2024   11:30 AM 02/21/2023   11:04 AM 02/11/2022   10:13 AM  MMSE - Mini Mental State Exam  Orientation to time 5 5 5   Orientation to Place 5 5 5   Registration 3 3 3   Attention/ Calculation 5 5 5   Recall 3 3 3   Language- name 2 objects 2 2 2   Language- repeat 1 1 1   Language- follow 3 step command 3 3 3   Language- read & follow direction 1 1 1   Write a sentence 1 1 1   Copy design 1 1 1   Total score 30 30 30     Functional Status Survey: Is the patient deaf or have difficulty hearing?: No Does the patient have difficulty seeing, even when wearing glasses/contacts?: No Does the patient have difficulty concentrating, remembering, or making decisions?: No Does the patient have difficulty walking or climbing  stairs?: No Does the patient have difficulty dressing or bathing?: No Does the patient have difficulty doing errands alone such as visiting a doctor's office or shopping?: No     11/18/2022   11:11 AM 02/21/2023   11:03 AM 06/02/2023   11:26 AM 09/19/2023   10:26 AM 03/01/2024   11:29 AM  Fall Risk  Falls in the past year? 0 0 0 0 0  Patient at Risk for Falls Due to     No Fall Risks  Fall risk Follow up   Falls evaluation completed  Falls evaluation completed       03/01/2024   11:29 AM  Depression screen PHQ 2/9  Decreased Interest 0  Down, Depressed, Hopeless 0  PHQ - 2 Score 0        No data to display            Current Medication: Outpatient Encounter Medications as of 03/01/2024  Medication Sig   atorvastatin  (LIPITOR) 40 MG tablet Take 1 tablet (40 mg total) by mouth daily.   FLUoxetine  (PROZAC ) 40 MG capsule TAKE 1 CAPSULE (40 MG TOTAL) BY MOUTH DAILY.   gabapentin  (NEURONTIN ) 600 MG tablet Take 1 tablet (600 mg total) by mouth 2 (two) times daily.   hydrochlorothiazide  (HYDRODIURIL ) 12.5 MG tablet TAKE 1 TABLET BY MOUTH EVERY DAY   levothyroxine  (SYNTHROID ) 88 MCG tablet TAKE  1 TABLET BY MOUTH EVERY DAY IN THE MORNING   LINZESS  290 MCG CAPS capsule TAKE 1 CAPSULE (290 MCG TOTAL) BY MOUTH DAILY BEFORE BREAKFAST.   omeprazole  (PRILOSEC) 40 MG capsule Take 1 capsule (40 mg total) by mouth daily.   [DISCONTINUED] clobetasol ointment (TEMOVATE) 0.05 % Apply 1 Application topically 2 (two) times daily.   clobetasol ointment (TEMOVATE) 0.05 % Apply 1 Application topically daily as needed.   No facility-administered encounter medications on file as of 03/01/2024.    Surgical History: Past Surgical History:  Procedure Laterality Date   COLONOSCOPY WITH PROPOFOL  N/A 08/14/2018   Procedure: COLONOSCOPY WITH PROPOFOL ;  Surgeon: Luke Salaam, MD;  Location: [facility] ENDOSCOPY;  Service: Gastroenterology;  Laterality: N/A;   ESOPHAGOGASTRODUODENOSCOPY (EGD) WITH PROPOFOL  N/A  08/14/2018   Procedure: ESOPHAGOGASTRODUODENOSCOPY (EGD) WITH PROPOFOL ;  Surgeon: Luke Salaam, MD;  Location: [facility] ENDOSCOPY;  Service: Gastroenterology;  Laterality: N/A;   HEMORRHOID SURGERY      Medical History: Past Medical History:  Diagnosis Date   Addiction to drug (HCC) 04/06/2015   ANXIETY 03/11/2008   Qualifier: Diagnosis of  By: Gaylyn Keas FNP, Nykedtra      Arthritis    BACK PAIN 02/17/2007   Qualifier: Diagnosis of  By: Joelle Musca MD, Oddis Bench    Chronic pain    Colitis    self   DEPRESSION 11/05/2003   Qualifier: Diagnosis of  By: Gaylyn Keas FNP, Nykedtra     GERD 12/29/2008   Qualifier: Diagnosis of  By: Gaylyn Keas FNP, Davene Ernst     H/O arthritis 02/20/2015   H/O: hypothyroidism 02/20/2015   HYPERCHOLESTEROLEMIA 03/11/2008   Qualifier: Diagnosis of  By: Gaylyn Keas FNP, Nykedtra     Hypertension    PAIN IN JOINT PELVIC REGION AND THIGH 03/11/2008   Qualifier: Diagnosis of  By: Gaylyn Keas FNP, Nykedtra     Renal disorder    Sciatica    Thyroid  disease    TOBACCO ABUSE 07/15/2008   Qualifier: Diagnosis of  By: Gaylyn Keas FNP, Davene Ernst      Family History: Family History  Problem Relation Age of Onset   Depression Mother    Hypertension Father    Heart failure Father    Diabetes Paternal Grandfather     Social History   Socioeconomic History   Marital status: Single    Spouse name: Not on file   Number of children: Not on file   Years of education: Not on file   Highest education level: Not on file  Occupational History   Not on file  Tobacco Use   Smoking status: Every Day    Current packs/day: 0.50    Average packs/day: 0.5 packs/day for 33.7 years (16.8 ttl pk-yrs)    Types: Cigarettes, E-cigarettes    Start date: 07/05/1990   Smokeless tobacco: Former   Tobacco comments:    Still using a vape daily  Vaping Use   Vaping status: Every Day  Substance and Sexual Activity   Alcohol  use: Yes    Alcohol /week: 0.0 standard drinks of alcohol     Comment: ocassionally   Drug use: Yes     Frequency: 2.0 times per week    Types: Marijuana   Sexual activity: Yes    Birth control/protection: Condom  Other Topics Concern   Not on file  Social History Narrative   Not on file   Social Drivers of Health   Financial Resource Strain: Not on file  Food Insecurity: Not on file  Transportation Needs: Not on file  Physical Activity:  Not on file  Stress: Not on file  Social Connections: Not on file  Intimate Partner Violence: Not on file      Review of Systems  Constitutional:  Positive for fatigue. Negative for chills and unexpected weight change.  HENT:  Negative for congestion, postnasal drip, rhinorrhea, sneezing and sore throat.   Eyes:  Negative for redness.  Respiratory:  Negative for cough, chest tightness and shortness of breath.   Cardiovascular:  Positive for chest pain. Negative for palpitations.       Intermittent CP  Gastrointestinal:  Negative for abdominal pain, constipation, diarrhea, nausea and vomiting.  Genitourinary:  Negative for dysuria and frequency.  Musculoskeletal:  Positive for arthralgias. Negative for joint swelling, neck pain and neck stiffness.  Skin:  Negative for color change and rash.  Neurological:  Negative for tremors.  Hematological:  Negative for adenopathy. Does not bruise/bleed easily.  Psychiatric/Behavioral:  Positive for sleep disturbance. Negative for behavioral problems (Depression) and suicidal ideas. The patient is nervous/anxious.     Vital Signs: BP 115/71   Pulse 62   Temp 97.8 F (36.6 C)   Resp 16   Ht 6' (1.829 m)   Wt 215 lb (97.5 kg)   SpO2 96%   BMI 29.16 kg/m    Physical Exam Vitals and nursing note reviewed.  Constitutional:      Appearance: Normal appearance.  HENT:     Head: Normocephalic and atraumatic.  Eyes:     Extraocular Movements: Extraocular movements intact.  Cardiovascular:     Rate and Rhythm: Normal rate and regular rhythm.  Pulmonary:     Effort: Pulmonary effort is normal.      Breath sounds: Normal breath sounds.  Skin:    General: Skin is warm and dry.  Neurological:     Mental Status: He is alert. Mental status is at baseline.  Psychiatric:        Mood and Affect: Mood normal.        Behavior: Behavior normal.        Assessment/Plan: 1. Encounter for annual wellness exam in Medicare patient (Primary) AWV performed, lab slip given, will think about vaccines  2. Essential hypertension, benign Stable, continue current medication  3. Intermittent left-sided chest pain EKG showed sinus brady but advised to go to ED if any new or worsening symptoms. Pt thinks it may be related to sleep position, though no reproducible pain on exam. Will also plan to update echo. May consider cardiology referral in future - ECHOCARDIOGRAM COMPLETE; Future - EKG 12-Lead  4. Bilateral carotid artery stenosis - US  Carotid Duplex Bilateral; Future  5. Mixed hyperlipidemia Continue lipitor and will update labs  6. Acquired hypothyroidism Continue synthroid  and will update labs       General Counseling: dyle renfro understanding of the findings of todays visit and agrees with plan of treatment. I have discussed any further diagnostic evaluation that may be needed or ordered today. We also reviewed his medications today. he has been encouraged to call the office with any questions or concerns that should arise related to todays visit.    Orders Placed This Encounter  Procedures   US  Carotid Duplex Bilateral   EKG 12-Lead   ECHOCARDIOGRAM COMPLETE    Meds ordered this encounter  Medications   clobetasol ointment (TEMOVATE) 0.05 %    Sig: Apply 1 Application topically daily as needed.    Dispense:  15 g    Refill:  0    Return for after  test is done.   Total time spent:40 Minutes Time spent includes review of chart, medications, test results, and follow up plan with the patient.   [place] Controlled Substance Database was reviewed by me.  This  patient was seen by Taylor Favia, PA-C in collaboration with Dr. Verneta Gone as a part of collaborative care agreement.  Taylor Favia, PA-C Internal medicine

## 2024-03-01 NOTE — Telephone Encounter (Signed)
 Per patient's request AVS indicating today's visit was a Wellness Visit was emailed to patient-Toni

## 2024-03-02 ENCOUNTER — Telehealth: Payer: Self-pay | Admitting: Physician Assistant

## 2024-03-02 NOTE — Telephone Encounter (Signed)
 Notified patient of u/s & echo appointment date, arrival time, location -Jeremy Delgado

## 2024-03-08 ENCOUNTER — Ambulatory Visit
Admission: RE | Admit: 2024-03-08 | Discharge: 2024-03-08 | Disposition: A | Discharge status: Home / Self Care | Source: Ambulatory Visit | Attending: Physician Assistant | Admitting: Physician Assistant

## 2024-03-08 DIAGNOSIS — I6523 Occlusion and stenosis of bilateral carotid arteries: Secondary | ICD-10-CM | POA: Diagnosis not present

## 2024-03-10 DIAGNOSIS — R42 Dizziness and giddiness: Secondary | ICD-10-CM | POA: Diagnosis not present

## 2024-03-15 DIAGNOSIS — F1120 Opioid dependence, uncomplicated: Secondary | ICD-10-CM | POA: Diagnosis not present

## 2024-03-18 ENCOUNTER — Other Ambulatory Visit: Payer: Self-pay | Admitting: Physician Assistant

## 2024-03-18 DIAGNOSIS — E782 Mixed hyperlipidemia: Secondary | ICD-10-CM

## 2024-03-22 DIAGNOSIS — F1120 Opioid dependence, uncomplicated: Secondary | ICD-10-CM | POA: Diagnosis not present

## 2024-04-05 ENCOUNTER — Ambulatory Visit
Admission: RE | Admit: 2024-04-05 | Discharge: 2024-04-05 | Disposition: A | Discharge status: Home / Self Care | Payer: Self-pay | Source: Ambulatory Visit | Attending: Physician Assistant | Admitting: Physician Assistant

## 2024-04-05 DIAGNOSIS — F419 Anxiety disorder, unspecified: Secondary | ICD-10-CM | POA: Diagnosis not present

## 2024-04-05 DIAGNOSIS — R0789 Other chest pain: Secondary | ICD-10-CM | POA: Diagnosis not present

## 2024-04-05 DIAGNOSIS — I10 Essential (primary) hypertension: Secondary | ICD-10-CM | POA: Diagnosis not present

## 2024-04-05 DIAGNOSIS — R079 Chest pain, unspecified: Secondary | ICD-10-CM | POA: Insufficient documentation

## 2024-04-05 DIAGNOSIS — K080 Exfoliation of teeth due to systemic causes: Secondary | ICD-10-CM | POA: Diagnosis not present

## 2024-04-05 LAB — ECHOCARDIOGRAM COMPLETE
AR max vel: 2.17 cm2
AV Area VTI: 2.43 cm2
AV Area mean vel: 1.99 cm2
AV Mean grad: 3 mmHg
AV Peak grad: 5.8 mmHg
Ao pk vel: 1.2 m/s
Area-P 1/2: 3.42 cm2
MV VTI: 2.28 cm2
S' Lateral: 3.8 cm

## 2024-04-05 NOTE — Progress Notes (Signed)
*  PRELIMINARY RESULTS* Echocardiogram 2D Echocardiogram has been performed.  Jeremy Delgado 04/05/2024, 9:18 AM

## 2024-04-12 DIAGNOSIS — F1120 Opioid dependence, uncomplicated: Secondary | ICD-10-CM | POA: Diagnosis not present

## 2024-04-19 DIAGNOSIS — F1120 Opioid dependence, uncomplicated: Secondary | ICD-10-CM | POA: Diagnosis not present

## 2024-04-29 ENCOUNTER — Encounter: Payer: Self-pay | Admitting: Physician Assistant

## 2024-04-29 ENCOUNTER — Ambulatory Visit (INDEPENDENT_AMBULATORY_CARE_PROVIDER_SITE_OTHER): Admitting: Physician Assistant

## 2024-04-29 VITALS — BP 118/62 | HR 56 | Temp 98.4°F | Resp 16 | Ht 72.0 in | Wt 218.0 lb

## 2024-04-29 DIAGNOSIS — F331 Major depressive disorder, recurrent, moderate: Secondary | ICD-10-CM | POA: Diagnosis not present

## 2024-04-29 DIAGNOSIS — I6523 Occlusion and stenosis of bilateral carotid arteries: Secondary | ICD-10-CM

## 2024-04-29 DIAGNOSIS — I10 Essential (primary) hypertension: Secondary | ICD-10-CM

## 2024-04-29 DIAGNOSIS — Z87891 Personal history of nicotine dependence: Secondary | ICD-10-CM | POA: Diagnosis not present

## 2024-04-29 DIAGNOSIS — K219 Gastro-esophageal reflux disease without esophagitis: Secondary | ICD-10-CM

## 2024-04-29 DIAGNOSIS — Z720 Tobacco use: Secondary | ICD-10-CM

## 2024-04-29 MED ORDER — OMEPRAZOLE 40 MG PO CPDR
40.0000 mg | DELAYED_RELEASE_CAPSULE | Freq: Every day | ORAL | 1 refills | Status: AC
Start: 2024-04-29 — End: ?

## 2024-04-29 NOTE — Progress Notes (Signed)
 [facility] [address] [place], [place] [address]  Internal MEDICINE  Office Visit Note  Patient Name: Jeremy Delgado  [id number]  [id number]  Date of Service: 04/29/2024  Chief Complaint  Patient presents with   Follow-up    Review CT and U/S   Hypertension   Hyperlipidemia   Gastroesophageal Reflux   Depression    HPI Pt is here for routine follow up to review echo and carotid US  -carotid US : min plaque with no evidence of significant flow limiting carotid stenosis bilaterally -echo: EF normal 55-60%, trivial MR -Needs omeprazole  refill -BP stable -No more chest pains since last visit, no neck issues recently either. No numbness -lab skip given last visit and will get this done -former cig smoker at 0.5-1ppd for 30 years and now vapes daily. Will order CT lung screening  Current Medication: Outpatient Encounter Medications as of 04/29/2024  Medication Sig   atorvastatin  (LIPITOR) 40 MG tablet TAKE 1 TABLET BY MOUTH EVERY DAY   clobetasol  ointment (TEMOVATE ) 0.05 % Apply 1 Application topically daily as needed.   FLUoxetine  (PROZAC ) 40 MG capsule TAKE 1 CAPSULE (40 MG TOTAL) BY MOUTH DAILY.   gabapentin  (NEURONTIN ) 600 MG tablet Take 1 tablet (600 mg total) by mouth 2 (two) times daily.   hydrochlorothiazide  (HYDRODIURIL ) 12.5 MG tablet TAKE 1 TABLET BY MOUTH EVERY DAY   levothyroxine  (SYNTHROID ) 88 MCG tablet TAKE 1 TABLET BY MOUTH EVERY DAY IN THE MORNING   LINZESS  290 MCG CAPS capsule TAKE 1 CAPSULE (290 MCG TOTAL) BY MOUTH DAILY BEFORE BREAKFAST.   [DISCONTINUED] omeprazole  (PRILOSEC) 40 MG capsule Take 1 capsule (40 mg total) by mouth daily.   omeprazole  (PRILOSEC) 40 MG capsule Take 1 capsule (40 mg total) by mouth daily.   No facility-administered encounter medications on file as of 04/29/2024.    Surgical History: Past Surgical History:  Procedure Laterality Date   COLONOSCOPY WITH PROPOFOL  N/A 08/14/2018   Procedure: COLONOSCOPY WITH PROPOFOL ;   Surgeon: Therisa Bi, MD;  Location: [facility] ENDOSCOPY;  Service: Gastroenterology;  Laterality: N/A;   ESOPHAGOGASTRODUODENOSCOPY (EGD) WITH PROPOFOL  N/A 08/14/2018   Procedure: ESOPHAGOGASTRODUODENOSCOPY (EGD) WITH PROPOFOL ;  Surgeon: Therisa Bi, MD;  Location: [facility] ENDOSCOPY;  Service: Gastroenterology;  Laterality: N/A;   HEMORRHOID SURGERY      Medical History: Past Medical History:  Diagnosis Date   Addiction to drug (HCC) 04/06/2015   ANXIETY 03/11/2008   Qualifier: Diagnosis of  By: Gladis FNP, Nykedtra      Arthritis    BACK PAIN 02/17/2007   Qualifier: Diagnosis of  By: Jimmy MD, Charlie Scarlet    Chronic pain    Colitis    self   DEPRESSION 11/05/2003   Qualifier: Diagnosis of  By: Gladis FNP, Nykedtra     GERD 12/29/2008   Qualifier: Diagnosis of  By: Gladis FNP, Delorise     H/O arthritis 02/20/2015   H/O: hypothyroidism 02/20/2015   HYPERCHOLESTEROLEMIA 03/11/2008   Qualifier: Diagnosis of  By: Gladis FNP, Nykedtra     Hypertension    PAIN IN JOINT PELVIC REGION AND THIGH 03/11/2008   Qualifier: Diagnosis of  By: Gladis FNP, Nykedtra     Renal disorder    Sciatica    Thyroid  disease    TOBACCO ABUSE 07/15/2008   Qualifier: Diagnosis of  By: Gladis FNP, Delorise      Family History: Family History  Problem Relation Age of Onset   Depression Mother    Hypertension Father    Heart failure  Father    Diabetes Paternal Grandfather     Social History   Socioeconomic History   Marital status: Single    Spouse name: Not on file   Number of children: Not on file   Years of education: Not on file   Highest education level: Not on file  Occupational History   Not on file  Tobacco Use   Smoking status: Every Day    Current packs/day: 0.75    Average packs/day: 0.8 packs/day for 33.8 years (25.4 ttl pk-yrs)    Types: Cigarettes, E-cigarettes    Start date: 07/05/1990   Smokeless tobacco: Former   Tobacco comments:    Still using a vape daily  Vaping Use   Vaping status:  Every Day  Substance and Sexual Activity   Alcohol  use: Yes    Alcohol /week: 0.0 standard drinks of alcohol     Comment: ocassionally   Drug use: Yes    Frequency: 2.0 times per week    Types: Marijuana   Sexual activity: Yes    Birth control/protection: Condom  Other Topics Concern   Not on file  Social History Narrative   Not on file   Social Drivers of Health   Financial Resource Strain: Not on file  Food Insecurity: Not on file  Transportation Needs: Not on file  Physical Activity: Not on file  Stress: Not on file  Social Connections: Not on file  Intimate Partner Violence: Not on file      Review of Systems  Constitutional:  Positive for fatigue. Negative for chills and unexpected weight change.  HENT:  Negative for congestion, postnasal drip, rhinorrhea, sneezing and sore throat.   Eyes:  Negative for redness.  Respiratory:  Negative for cough, chest tightness and shortness of breath.   Cardiovascular:  Negative for chest pain and palpitations.  Gastrointestinal:  Negative for abdominal pain, constipation, diarrhea, nausea and vomiting.  Genitourinary:  Negative for dysuria and frequency.  Musculoskeletal:  Positive for arthralgias. Negative for back pain, joint swelling and neck pain.  Skin:  Negative for rash.  Neurological:  Negative for tremors and numbness.  Hematological:  Negative for adenopathy. Does not bruise/bleed easily.  Psychiatric/Behavioral:  Positive for sleep disturbance. Negative for behavioral problems (Depression) and suicidal ideas. The patient is nervous/anxious.     Vital Signs: BP 118/62   Pulse (!) 56   Temp 98.4 F (36.9 C)   Resp 16   Ht 6' (1.829 m)   Wt 218 lb (98.9 kg)   SpO2 96%   BMI 29.57 kg/m    Physical Exam Vitals and nursing note reviewed.  Constitutional:      Appearance: Normal appearance.  HENT:     Head: Normocephalic and atraumatic.   Eyes:     Extraocular Movements: Extraocular movements intact.     Cardiovascular:     Rate and Rhythm: Normal rate and regular rhythm.  Pulmonary:     Effort: Pulmonary effort is normal.     Breath sounds: Normal breath sounds.   Skin:    General: Skin is warm and dry.   Neurological:     Mental Status: He is alert. Mental status is at baseline.   Psychiatric:        Mood and Affect: Mood normal.        Behavior: Behavior normal.        Assessment/Plan: 1. Essential hypertension, benign (Primary) Stable continue current medication  2. Moderate episode of recurrent major depressive disorder (HCC) Stable, continue current  medication  3. Gastroesophageal reflux disease without esophagitis - omeprazole  (PRILOSEC) 40 MG capsule; Take 1 capsule (40 mg total) by mouth daily.  Dispense: 90 capsule; Refill: 1  4. Former moderate cigarette smoker (10-19 per day) Will order CT lung screening - CT CHEST LUNG CA SCREEN LOW DOSE W/O CM; Future  5. Vapes nicotine containing substance - CT CHEST LUNG CA SCREEN LOW DOSE W/O CM; Future  6. Atherosclerosis of both carotid arteries No significant stenosis, continue lipitor    General Counseling: Jeremy Delgado understanding of the findings of todays visit and agrees with plan of treatment. I have discussed any further diagnostic evaluation that may be needed or ordered today. We also reviewed his medications today. he has been encouraged to call the office with any questions or concerns that should arise related to todays visit.    Orders Placed This Encounter  Procedures   CT CHEST LUNG CA SCREEN LOW DOSE W/O CM    Meds ordered this encounter  Medications   omeprazole  (PRILOSEC) 40 MG capsule    Sig: Take 1 capsule (40 mg total) by mouth daily.    Dispense:  90 capsule    Refill:  1    This patient was seen by Tinnie Pro, PA-C in collaboration with Dr. Sigrid Bathe as a part of collaborative care agreement.   Total time spent:30 Minutes Time spent includes review of chart,  medications, test results, and follow up plan with the patient.      Dr Fozia M Khan Internal medicine

## 2024-05-05 ENCOUNTER — Other Ambulatory Visit: Payer: Self-pay | Admitting: Physician Assistant

## 2024-05-05 DIAGNOSIS — F321 Major depressive disorder, single episode, moderate: Secondary | ICD-10-CM

## 2024-05-13 ENCOUNTER — Ambulatory Visit
Admission: RE | Admit: 2024-05-13 | Discharge: 2024-05-13 | Disposition: A | Discharge status: Home / Self Care | Source: Ambulatory Visit | Attending: Physician Assistant | Admitting: Physician Assistant

## 2024-05-13 DIAGNOSIS — Z720 Tobacco use: Secondary | ICD-10-CM

## 2024-05-13 DIAGNOSIS — Z87891 Personal history of nicotine dependence: Secondary | ICD-10-CM | POA: Diagnosis not present

## 2024-05-13 DIAGNOSIS — Z122 Encounter for screening for malignant neoplasm of respiratory organs: Secondary | ICD-10-CM | POA: Diagnosis not present

## 2024-05-19 DIAGNOSIS — K080 Exfoliation of teeth due to systemic causes: Secondary | ICD-10-CM | POA: Diagnosis not present

## 2024-05-26 ENCOUNTER — Ambulatory Visit: Payer: Self-pay | Admitting: Physician Assistant

## 2024-06-07 DIAGNOSIS — F1120 Opioid dependence, uncomplicated: Secondary | ICD-10-CM | POA: Diagnosis not present

## 2024-06-14 ENCOUNTER — Telehealth (INDEPENDENT_AMBULATORY_CARE_PROVIDER_SITE_OTHER): Admitting: Physician Assistant

## 2024-06-14 VITALS — Ht 72.0 in | Wt 203.0 lb

## 2024-06-14 DIAGNOSIS — J439 Emphysema, unspecified: Secondary | ICD-10-CM

## 2024-06-14 DIAGNOSIS — F1120 Opioid dependence, uncomplicated: Secondary | ICD-10-CM | POA: Diagnosis not present

## 2024-06-14 DIAGNOSIS — I10 Essential (primary) hypertension: Secondary | ICD-10-CM

## 2024-06-14 DIAGNOSIS — I700 Atherosclerosis of aorta: Secondary | ICD-10-CM

## 2024-06-14 MED ORDER — ALBUTEROL SULFATE HFA 108 (90 BASE) MCG/ACT IN AERS: 2.0000 | INHALATION_SPRAY | Freq: Four times a day (QID) | RESPIRATORY_TRACT | 0 refills | Status: AC | PRN

## 2024-06-14 MED ORDER — HYDROCHLOROTHIAZIDE 12.5 MG PO TABS
12.5000 mg | ORAL_TABLET | Freq: Every day | ORAL | 1 refills | Status: AC
Start: 2024-06-14 — End: ?
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
## 2024-06-15 DIAGNOSIS — F1120 Opioid dependence, uncomplicated: Secondary | ICD-10-CM | POA: Diagnosis not present

## 2024-06-21 DIAGNOSIS — F1120 Opioid dependence, uncomplicated: Secondary | ICD-10-CM | POA: Diagnosis not present

## 2024-06-23 ENCOUNTER — Ambulatory Visit (INDEPENDENT_AMBULATORY_CARE_PROVIDER_SITE_OTHER): Admitting: Internal Medicine

## 2024-06-23 DIAGNOSIS — J439 Emphysema, unspecified: Secondary | ICD-10-CM

## 2024-07-08 ENCOUNTER — Encounter: Payer: Self-pay | Admitting: Physician Assistant

## 2024-07-08 ENCOUNTER — Ambulatory Visit (INDEPENDENT_AMBULATORY_CARE_PROVIDER_SITE_OTHER): Admitting: Physician Assistant

## 2024-07-08 VITALS — BP 120/63 | HR 67 | Temp 98.0°F | Resp 16 | Ht 72.0 in | Wt 208.0 lb

## 2024-07-08 DIAGNOSIS — I10 Essential (primary) hypertension: Secondary | ICD-10-CM

## 2024-07-08 DIAGNOSIS — T148XXA Other injury of unspecified body region, initial encounter: Secondary | ICD-10-CM

## 2024-07-08 DIAGNOSIS — J439 Emphysema, unspecified: Secondary | ICD-10-CM
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
## 2024-07-11 NOTE — Procedures (Signed)
 [facility] [address] [place], [address]    Complete Pulmonary Function Testing Interpretation:  FINDINGS:  The forced vital capacity is normal FEV1 is normal-FVC ratio was decreased.  Postbronchodilator there was a significant change in the FEV1 total lung capacity is increased residual volume is increased residual volumes on passive ratio is increased DLCO was normal  IMPRESSION:  This pulmonary function study is well within normal limits clinical correlation is recommended.  Jeremy DELENA Bathe, MD [facility] Pulmonary Critical Care Medicine Sleep Medicine

## 2024-07-12 LAB — PULMONARY FUNCTION TEST

## 2024-07-22 DIAGNOSIS — K080 Exfoliation of teeth due to systemic causes: Secondary | ICD-10-CM | POA: Diagnosis not present

## 2024-08-09 DIAGNOSIS — F1120 Opioid dependence, uncomplicated: Secondary | ICD-10-CM | POA: Diagnosis not present

## 2024-08-16 DIAGNOSIS — F1120 Opioid dependence, uncomplicated: Secondary | ICD-10-CM | POA: Diagnosis not present

## 2024-08-18 DIAGNOSIS — K080 Exfoliation of teeth due to systemic causes: Secondary | ICD-10-CM | POA: Diagnosis not present

## 2024-09-06 DIAGNOSIS — F1120 Opioid dependence, uncomplicated: Secondary | ICD-10-CM | POA: Diagnosis not present

## 2024-09-13 DIAGNOSIS — F1120 Opioid dependence, uncomplicated: Secondary | ICD-10-CM | POA: Diagnosis not present

## 2024-09-20 DIAGNOSIS — M542 Cervicalgia: Secondary | ICD-10-CM | POA: Diagnosis not present

## 2024-09-20 DIAGNOSIS — Z1331 Encounter for screening for depression: Secondary | ICD-10-CM | POA: Diagnosis not present

## 2024-09-20 DIAGNOSIS — R200 Anesthesia of skin: Secondary | ICD-10-CM | POA: Diagnosis not present

## 2024-10-07 ENCOUNTER — Ambulatory Visit: Admitting: Physician Assistant

## 2024-10-14 ENCOUNTER — Ambulatory Visit: Admitting: Physician Assistant

## 2024-10-18 ENCOUNTER — Ambulatory Visit: Admitting: Physician Assistant

## 2024-10-18 ENCOUNTER — Encounter: Payer: Self-pay | Admitting: Physician Assistant

## 2024-10-18 VITALS — BP 130/90 | HR 73 | Temp 98.0°F | Resp 16 | Ht 72.0 in | Wt 211.0 lb

## 2024-10-18 DIAGNOSIS — E039 Hypothyroidism, unspecified: Secondary | ICD-10-CM

## 2024-10-18 DIAGNOSIS — K219 Gastro-esophageal reflux disease without esophagitis: Secondary | ICD-10-CM

## 2024-10-18 DIAGNOSIS — Z125 Encounter for screening for malignant neoplasm of prostate: Secondary | ICD-10-CM

## 2024-10-18 DIAGNOSIS — G8929 Other chronic pain: Secondary | ICD-10-CM

## 2024-10-18 DIAGNOSIS — F321 Major depressive disorder, single episode, moderate: Secondary | ICD-10-CM | POA: Diagnosis not present

## 2024-10-18 DIAGNOSIS — R5383 Other fatigue: Secondary | ICD-10-CM

## 2024-10-18 DIAGNOSIS — E782 Mixed hyperlipidemia: Secondary | ICD-10-CM

## 2024-10-18 DIAGNOSIS — E559 Vitamin D deficiency, unspecified: Secondary | ICD-10-CM | POA: Diagnosis not present

## 2024-10-18 DIAGNOSIS — I10 Essential (primary) hypertension: Secondary | ICD-10-CM | POA: Diagnosis not present

## 2024-10-18 MED ORDER — ATORVASTATIN CALCIUM 40 MG PO TABS: 40.0000 mg | ORAL_TABLET | Freq: Every day | ORAL | 1 refills | Status: AC

## 2024-10-18 MED ORDER — FLUOXETINE HCL 40 MG PO CAPS: 40.0000 mg | ORAL_CAPSULE | Freq: Every day | ORAL | 1 refills | Status: AC

## 2024-10-18 MED ORDER — HYDROCHLOROTHIAZIDE 12.5 MG PO TABS: 12.5000 mg | ORAL_TABLET | Freq: Every day | ORAL | 1 refills | Status: AC

## 2024-10-18 MED ORDER — LEVOTHYROXINE SODIUM 88 MCG PO TABS: ORAL_TABLET | ORAL | 1 refills | Status: AC

## 2024-10-18 MED ORDER — OMEPRAZOLE 40 MG PO CPDR: 40.0000 mg | DELAYED_RELEASE_CAPSULE | Freq: Every day | ORAL | 1 refills | Status: AC

## 2024-10-18 MED ORDER — GABAPENTIN 600 MG PO TABS: 600.0000 mg | ORAL_TABLET | Freq: Every day | ORAL | 1 refills | Status: AC

## 2024-10-18 NOTE — Progress Notes (Unsigned)
 [facility] [address] [place], [place] [address]  Internal MEDICINE  Office Visit Note  Patient Name: Jeremy Delgado  [id number]  [id number]  Date of Service: 10/18/2024  Chief Complaint  Patient presents with   Follow-up   Gastroesophageal Reflux   Hypertension   Hyperlipidemia    HPI Pt is here for routine follow up -unfortunately lost his dog this morning after 15 years, states he went out yesterday and hasn't been able to find him -needs his med refills -still craving cigs, but hasn't had one or vaped. Using nicotine pouches -due for labs, had slip but forgot about this and will reorder -did see neurology about his neck pain, will be having nerve conduction study  Current Medication: Outpatient Encounter Medications as of 10/18/2024  Medication Sig   albuterol  (VENTOLIN  HFA) 108 (90 Base) MCG/ACT inhaler Inhale 2 puffs into the lungs every 6 (six) hours as needed for wheezing or shortness of breath.   atorvastatin  (LIPITOR) 40 MG tablet TAKE 1 TABLET BY MOUTH EVERY DAY   clobetasol  ointment (TEMOVATE ) 0.05 % Apply 1 Application topically daily as needed.   FLUoxetine  (PROZAC ) 40 MG capsule TAKE 1 CAPSULE (40 MG TOTAL) BY MOUTH DAILY.   gabapentin  (NEURONTIN ) 600 MG tablet Take 1 tablet (600 mg total) by mouth 2 (two) times daily.   hydrochlorothiazide  (HYDRODIURIL ) 12.5 MG tablet Take 1 tablet (12.5 mg total) by mouth daily.   levothyroxine  (SYNTHROID ) 88 MCG tablet TAKE 1 TABLET BY MOUTH EVERY DAY IN THE MORNING   omeprazole  (PRILOSEC) 40 MG capsule Take 1 capsule (40 mg total) by mouth daily.   [DISCONTINUED] LINZESS  290 MCG CAPS capsule TAKE 1 CAPSULE (290 MCG TOTAL) BY MOUTH DAILY BEFORE BREAKFAST.   No facility-administered encounter medications on file as of 10/18/2024.    Surgical History: Past Surgical History:  Procedure Laterality Date   COLONOSCOPY WITH PROPOFOL  N/A 08/14/2018   Procedure: COLONOSCOPY WITH PROPOFOL ;  Surgeon: Therisa Bi,  MD;  Location: [facility] ENDOSCOPY;  Service: Gastroenterology;  Laterality: N/A;   ESOPHAGOGASTRODUODENOSCOPY (EGD) WITH PROPOFOL  N/A 08/14/2018   Procedure: ESOPHAGOGASTRODUODENOSCOPY (EGD) WITH PROPOFOL ;  Surgeon: Therisa Bi, MD;  Location: [facility] ENDOSCOPY;  Service: Gastroenterology;  Laterality: N/A;   HEMORRHOID SURGERY      Medical History: Past Medical History:  Diagnosis Date   Addiction to drug (HCC) 04/06/2015   ANXIETY 03/11/2008   Qualifier: Diagnosis of  By: Gladis FNP, Nykedtra      Arthritis    BACK PAIN 02/17/2007   Qualifier: Diagnosis of  By: Jimmy MD, Charlie Scarlet    Chronic pain    Colitis    self   DEPRESSION 11/05/2003   Qualifier: Diagnosis of  By: Gladis FNP, Nykedtra     GERD 12/29/2008   Qualifier: Diagnosis of  By: Gladis FNP, Delorise     H/O arthritis 02/20/2015   H/O: hypothyroidism 02/20/2015   HYPERCHOLESTEROLEMIA 03/11/2008   Qualifier: Diagnosis of  By: Gladis FNP, Nykedtra     Hypertension    PAIN IN JOINT PELVIC REGION AND THIGH 03/11/2008   Qualifier: Diagnosis of  By: Gladis FNP, Nykedtra     Renal disorder    Sciatica    Thyroid  disease    TOBACCO ABUSE 07/15/2008   Qualifier: Diagnosis of  By: Gladis FNP, Delorise      Family History: Family History  Problem Relation Age of Onset   Depression Mother    Hypertension Father    Heart failure Father    Diabetes Paternal  Grandfather     Social History   Socioeconomic History   Marital status: Single    Spouse name: Not on file   Number of children: Not on file   Years of education: Not on file   Highest education level: Not on file  Occupational History   Not on file  Tobacco Use   Smoking status: Every Day    Current packs/day: 0.75    Average packs/day: 0.8 packs/day for 34.3 years (25.7 ttl pk-yrs)    Types: Cigarettes, E-cigarettes    Start date: 07/05/1990   Smokeless tobacco: Former   Tobacco comments:    Quit 06/14/24  Vaping Use   Vaping status: Every Day  Substance and Sexual  Activity   Alcohol  use: Yes    Alcohol /week: 0.0 standard drinks of alcohol     Comment: ocassionally   Drug use: Yes    Frequency: 2.0 times per week    Types: Marijuana   Sexual activity: Yes    Birth control/protection: Condom  Other Topics Concern   Not on file  Social History Narrative   Not on file   Social Drivers of Health   Tobacco Use: High Risk (10/18/2024)   Patient History    Smoking Tobacco Use: Every Day    Smokeless Tobacco Use: Former    Passive Exposure: Not on Actuary Strain: Low Risk  (09/20/2024)   Received from [facility]   Overall Financial Resource Strain (CARDIA)    Difficulty of Paying Living Expenses: Not very hard  Food Insecurity: No Food Insecurity (09/20/2024)   Received from [facility]   Epic    Within the past 12 months, you worried that your food would run out before you got the money to buy more.: Never true    Within the past 12 months, the food you bought just didn't last and you didn't have money to get more.: Never true  Transportation Needs: No Transportation Needs (09/20/2024)   Received from [facility] - Transportation    In the past 12 months, has lack of transportation kept you from medical appointments or from getting medications?: No    Lack of Transportation (Non-Medical): No  Physical Activity: Not on file  Stress: Not on file  Social Connections: Not on file  Intimate Partner Violence: Not on file  Depression (PHQ2-9): Low Risk (04/29/2024)   Depression (PHQ2-9)    PHQ-2 Score: 0  Alcohol  Screen: Low Risk (02/21/2023)   Alcohol  Screen    Last Alcohol  Screening Score (AUDIT): 1  Housing: Low Risk  (09/20/2024)   Received from [facility]   Epic    In the last 12 months, was there a time when you were not able to pay the mortgage or rent on time?: No    In the past 12 months, how many times have you moved where you were  living?: 0    At any time in the past 12 months, were you homeless or living in a shelter (including now)?: No  Utilities: Not At Risk (09/20/2024)   Received from [facility]   Epic    In the past 12 months has the electric, gas, oil, or water company threatened to shut off services in your home?: No  Health Literacy: Not on file      Review of Systems  Vital Signs: Pulse 73   Temp 98 F (36.7 C)   Resp 16  Ht 6' (1.829 m)   Wt 211 lb (95.7 kg)   SpO2 98%   BMI 28.62 kg/m    Physical Exam     Assessment/Plan:   General Counseling: K[place]ney verbalizes understanding of the findings of todays visit and agrees with plan of treatment. I have discussed any further diagnostic evaluation that may be needed or ordered today. We also reviewed his medications today. he has been encouraged to call the office with any questions or concerns that should arise related to todays visit.    No orders of the defined types were placed in this encounter.   No orders of the defined types were placed in this encounter.   This patient was seen by Tinnie Pro, PA-C in collaboration with Dr. Sigrid Bathe as a part of collaborative care agreement.   Total time spent:*** Minutes Time spent includes review of chart, medications, test results, and follow up plan with the patient.      Dr Fozia M Khan Internal medicine

## 2025-03-03 ENCOUNTER — Ambulatory Visit: Admitting: Physician Assistant

## 2025-07-06 ENCOUNTER — Encounter: Admitting: Internal Medicine
